# Patient Record
Sex: Male | Born: 1955 | Race: White | Hispanic: No | Marital: Married | Smoking: Former smoker
Health system: Southern US, Community
[De-identification: ages and names within clinical notes are randomized; demographics above are authoritative.]

## PROBLEM LIST (undated history)

## (undated) DIAGNOSIS — I639 Cerebral infarction, unspecified: Secondary | ICD-10-CM

## (undated) DIAGNOSIS — E7800 Pure hypercholesterolemia, unspecified: Secondary | ICD-10-CM

## (undated) DIAGNOSIS — E119 Type 2 diabetes mellitus without complications: Secondary | ICD-10-CM

## (undated) DIAGNOSIS — I10 Essential (primary) hypertension: Secondary | ICD-10-CM

## (undated) DIAGNOSIS — I2510 Atherosclerotic heart disease of native coronary artery without angina pectoris: Secondary | ICD-10-CM

## (undated) HISTORY — DX: Cerebral infarction, unspecified: I63.9

## (undated) HISTORY — DX: Pure hypercholesterolemia, unspecified: E78.00

## (undated) HISTORY — DX: Essential (primary) hypertension: I10

## (undated) HISTORY — DX: Type 2 diabetes mellitus without complications: E11.9

## (undated) HISTORY — PX: CORONARY ARTERY BYPASS GRAFT: SHX141

## (undated) HISTORY — DX: Atherosclerotic heart disease of native coronary artery without angina pectoris: I25.10

## (undated) NOTE — Progress Notes (Signed)
 Formatting of this note might be different from the original.  Please see study notes for details of this check    Completed by:  GLENDIA CHRISTELLA GENTRY, December 25, 2019, 8:47 AM      Electronically signed by GENTRY GLENDIA CHRISTELLA at 12/25/2019  8:51 AM EST

## (undated) NOTE — Telephone Encounter (Signed)
 Formatting of this note is different from the original.  Verbatim message to be read by call center staff, to member, when member is returning a call:    Member is over-due for the A1C lab test (TSR, please schedule a lab appointment, or inform member can walk-in to the lab),, is due for a diabetes chronic condition review (TSR, please schedule a PCP in-office appointment), and is over-due for the microalbumin urine lab test (TSR, please schedule a lab appointment, or inform member can walk-in to the lab),    A1C Outreach    Health Maintenance Due   Topic Date Due   ? Colorectal Cancer Screening  Never done   ? Diabetic Retinal Screening  06/24/2020   ? Covid-19 Vaccine (3 - Moderna-Booster (50 mcg)) 07/31/2020   ? Diabetic Foot Exam  03/17/2021     It?s time for your A1c lab test for diabetes. The A1c blood test checks your average blood sugar levels over 2-3 months and tells your doctor if your diabetes is under control. It?s important to keep your blood sugar in control to help prevent complications, such as heart and kidney disease, and blindness.     A lab order is already in our system. Please stop by the lab within the next week. You can go to any [facility] lab location and some are open 24/7, such as ______________ (name the closest facility open for AUC 24/7)     You do not need to fast for this test. Please go to the lab as soon as you can.     Outcome: Other LM on VM      ---------------------------------------------------------------------------------------------------------------------     Patient education resources if they want more information to manage their diabetes:   InSTEP classes    Register online on [email or web address] (schedule appointment for Diabetes InSTEP class or call [phone])    [email or web address]    Health coaching (nutrition, physical activity, tobacco cessation, stress management) by phone at a time determined by patient. No additional charge. Call [phone], Monday  through Friday from 10 a.m. to 10 p.m.   EMMI video programs    My doctor online web site ([email or web address]) and go to your PCPs page    Look for 'quick links' on the left side then drop down and click on EMMI    Electronically signed by Nguyen, Tsr Marilyn Angelique at 04/14/2021  2:13 PM EDT

## (undated) NOTE — Progress Notes (Signed)
 Formatting of this note is different from the original.  COMPLEX CARE FOLLOW-UP VISIT     Keith Gates is a 51 yr old male with history of hypertension, CKD, CAD, history of hyperkalemia here with wife for follow-up of blood pressure        Current complaints:  Patient was seen 2 weeks ago was noted to have chronically low blood pressure.  On carvedilol  6.25 mg twice a day prior to that.  Dose was decreased to 3.125 mg twice daily.   follow-up today was to see what readings are on this dose.  Patient actually did not decrease the dose continues on 6.25 mg twice daily.  He however notes that blood pressure readings at home are elevated around 140-150 systolic.  He did not bring his meter with him.  He is not sure if it is working right.  Denies being dizzy lightheaded or other complaints.  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                Member was unresponsive to outreach on 12/28/18.                 Dorthea Rubin HUGHS, CCM               [phone]                 Eula Mazzola is a 64 yr old male referred to case               management for Functional Deficitis               (Mobility/ADL), on 3/213/2020.                Member was contacted on 01/07/19 and agrees to               participate in Case management.                Dorthea Rubin HUGHS, CCM               [phone]                   Yorel Redder is a 76 yr old male referred to case               management for Assist and provide educational                resources on the following diagnosis, on               pericardial effusion.  Member was unresponsive to outreach on 04/04/18                Rock HERO. Stanton, RN, BSN, CCM               Ambulatory Case Manager               Phone: [phone]                 Branon Sabine is a 53 yr old male referred to case               management for Assist and provide educational               resources on the following diagnosis, on               pericardial effusion.                Member was contacted on 04/10/18 and declines to               participate in Case management.                Rock HERO. Stanton, RN, BSN, C    SOLITARY PULMONARY NODULE      Date Noted: 03/19/2018      Comment: Needs repeat CT chest in 12/19 or 6/20    HX OF CABG      Date Noted: 02/10/2018      Comment: 01/26/18:CORONARY ARTERY BYPASS GRAFT x 4 with               left leg endoscopic vein harvest., DRAINAGE               OFlargeLEFT AND RIGHT PLEURAL EFFUSIONs    MICROALBUMINURIA      Date Noted: 02/10/2018    SYSTOLIC HEART FAILURE, UNSPECIFIED ACUITY      Date Noted: 01/23/2018      Comment: Ef 30%       HX OF NON ST ELEVATION MI      Date Noted: 01/22/2018    SCREENING FOR DIABETIC FOOT DISEASE, CATEGORY 2 - FOOT AT MODERATE RISK      Date Noted: 10/13/2017    HX OF ISCHEMIC TIA      Date Noted: 09/14/2016      Comment: MRA in 2013: 1. No significant stenosis is               identified in the cervical or intracranial               vasculature.               2. 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    HTN (HYPERTENSION)      Date Noted: 09/14/2016      Comment:         ALLERGIES  No Known Allergies  Past Surgical History:   Procedure Laterality Date   ? CORONARY ARTERY BYPASS  02/2018   ? PERCUTANEOUS GASTROSTOMY TUBE  2019     Past Medical History:   Diagnosis Date   ? HX OF CABG 02/10/2018    01/26/18:CORONARY ARTERY BYPASS GRAFT x 4 with left leg endoscopic vein harvest., DRAINAGE OFlargeLEFT AND RIGHT PLEURAL EFFUSIONs      Social History     Tobacco Use   ? Smoking status: Former Smoker     Packs/day: 1.00     Types: Cigarettes     Start date: 09/15/1979  Quit date: 09/15/2011     Years since quitting: 8.9   ? Smokeless tobacco: Never Used   Vaping Use   ? Vaping Use: Never assessed   Substance Use Topics   ? Alcohol use: No   ? Drug use: No     Family History   Problem Relation Age of Onset   ? No Known Problems Father    ? No Known Problems Mother      Active Medications as of 08/25/2020:  ATORVASTATIN  80 MG ORAL TAB,  Sig: Take 1 tablet by mouth daily for cholesterol and heart protection  FUROSEMIDE  20 MG ORAL TAB,  Sig: Take 1 tablet by mouth daily  CARVEDILOL   3.125 MG ORAL TAB,  Sig: Take 1 tablet by mouth 2 times a day with meals  VELTASSA  8.4 G ORAL PWD PKT,  Sig: Take 1 packet by mouth daily  ASPIRIN    81 MG ORAL TBEC DR TAB,  Sig: Take 1 tablet by mouth daily  GLIPIZIDE  5 MG ORAL TAB,  Sig: Take 1 tablet by mouth every morning 30 minutes before a meal for diabetes  DOCUSATE SODIUM  100 MG ORAL CAP,  Sig: Take 1-2 capsules by mouth 2 times a day as needed  FERROUS SULFATE  325 MG (65 MG IRON ) ORAL TAB,  Sig: Take 1 tablet by mouth every morning with breakfast    Wt Readings from Last 3 Encounters:   08/25/20 166 lb 9.6 oz (75.6 kg)   08/11/20 166 lb (75.3 kg)   05/28/20 168 lb 4.8 oz (76.3 kg)     Temp Readings from Last 3 Encounters:   08/25/20 97.8 F (36.6 C) (Oral)   08/11/20 97.2 F (36.2 C) (Oral)   05/28/20 97.6 F (36.4 C) (Oral)     BP Readings from Last 3 Encounters:   08/25/20 (!) 85/45   08/11/20 92/58   05/28/20 100/58     Pulse Readings from Last 3 Encounters:   08/25/20 64   08/11/20 70   05/28/20 64     No results found for: WBC ONCO  Lab Results   Component Value Date    WBC 8.0 08/11/2020    HGB 14.1 08/11/2020    HCT 42.5 08/11/2020    PLT 155 08/11/2020     NA   Date Value Ref Range Status   08/11/2020 142 136 - 145 mmol/L Final     K   Date Value Ref Range Status   08/11/2020 5.4 (H) 3.6 - 5.2  mmol/L Final     CL   Date Value Ref Range Status   08/11/2020 108 (H) 98 - 107 mmol/L Final     CO2   Date Value Ref Range Status   08/11/2020 23 22 - 29 mmol/L Final     BUN   Date Value Ref Range Status   08/11/2020 32 (H) 8 - 23 mg/dL Final     RBS   Date Value Ref Range Status   08/11/2020 158 (H) 70 - 139 mg/dL Final     FBS   Date Value Ref Range Status   10/03/2017 111 (H) 70 - 100 mg/dL Final     CR   Date Value Ref Range Status   08/11/2020 2.09 (H) 0.70 - 1.20 mg/dL Final     Review of Systems - negative except as noted in HPI     Physical Examination  BP (!) 85/45   Pulse 64   Temp 97.8 F (36.6 C) (  Oral)   Resp 14   Wt 166 lb 9.6 oz (75.6 kg)   SpO2 97%   BMI 23.24 kg/m   Gen appearance - Awake, alert,  oriented , NAD  HEENT- PERLA, no JVD or lymphadenopathy. Neck supple. No thyromegaly   CVS-  S1S2 + regular  RS- BAE+ clear   Abd- soft, BS+, NT   Ext-  no edema  CNS- no FND  Psych- pleasant, co-operative , appropriate   Skin- no rash     Code status :  Full    No orders of the defined types were placed in this encounter.    Assessment    ICD-10-CM    1. DM 2 W HYPERLIPIDEMIA  E11.69     E78.5    2. CHRONIC HYPOTENSION  I95.89    3. ANEMIA  D64.9    4. COMPLEX CARE COORDINATION PLAN  Z71.89    5. HTN (HYPERTENSION)  I10    6. CKD STAGE 3B (GFR 30-44)  N18.32      Asked patient to decrease carvedilol  to 3.125 mg p.o. 2 times daily.  Follow-up appointment scheduled.  Advised to bring the blood pressure machine with him as well as glucometer.  Continue other medications as before.    I have confirmed the presence of the above clinical diagnoses, which were considered in the current and ongoing care of the patient. At the time of the visit, the patient stated, and/or the medical record indicates, that there are no changes in these conditions, unless otherwise noted, and the patient was advised to follow up with his PCP and Specialist  as his treatment warrants.    Complex care team will continue to  outreach patient regularly and is available for any questions/ concerns.  Pt was seen for 40 minutes face to face in clinic during this encounter and >50% of this time was spent in counseling and  co-ordination of care .      Electronically signed by Abdulsalam, Farah Zeshan (M.D.), M.D. at 08/25/2020  5:27 PM EST

## (undated) NOTE — Progress Notes (Signed)
 Formatting of this note is different from the original.  COMPLEX CARE FOLLOW-UP VISIT     Keith Gates is a 61 yr old male with history of hypertension, diabetes with hyperlipidemia, CKD stage IIIB, chronic hypertension, anemia, history of cardiac arrest, history of pacemaker, history of hyperkalemia here with wife for follow-up        Current complaints:    Patient brought his blood pressure machine.  Says that his blood pressure is elevated around 150 in the morning when he checks before medications.  This is usually around 11a.m.SABRA  Patient takes carvedilol  around 11:00 a.m. and again around 6:00 p.m.SABRA  Blood pressure in the office in the afternoon has consistently been low-less than 100 systolic usually.  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                Member was unresponsive to outreach on 12/28/18.                 Dorthea Rubin HUGHS, CCM               [phone]                 Castiel Lauricella is a 82 yr old male referred to case               management for Functional Deficitis               (Mobility/ADL), on 3/213/2020.                Member was contacted on 01/07/19 and agrees to               participate in Case management.                Dorthea Rubin HUGHS, CCM               [phone]                   Tamarion Haymond is a 69 yr old male referred to case               management for Assist and provide educational               resources on the  following diagnosis, on               pericardial effusion.                Member was unresponsive to outreach  on 04/04/18                Rock HERO. Stanton, RN, BSN, CCM               Ambulatory Case Manager               Phone: [phone]                 Jaaziah Schulke is a 83 yr old male referred to case               management for Assist and provide educational               resources on the following diagnosis, on               pericardial effusion.                Member was contacted on 04/10/18 and declines to               participate in Case management.                Rock HERO. Stanton, RN, BSN, C    SOLITARY PULMONARY NODULE      Date Noted: 03/19/2018      Comment: Needs repeat CT chest in 12/19 or 6/20    HX OF CABG      Date Noted: 02/10/2018      Comment: 01/26/18:CORONARY ARTERY BYPASS GRAFT x 4 with               left leg endoscopic vein harvest., DRAINAGE               OFlargeLEFT AND RIGHT PLEURAL EFFUSIONs    MICROALBUMINURIA      Date Noted: 02/10/2018    SYSTOLIC HEART FAILURE, UNSPECIFIED ACUITY      Date Noted: 01/23/2018      Comment: Ef 30%       HX OF NON ST ELEVATION MI      Date Noted: 01/22/2018    SCREENING FOR DIABETIC FOOT DISEASE, CATEGORY 2 - FOOT AT MODERATE RISK      Date Noted: 10/13/2017    HX OF ISCHEMIC TIA      Date Noted: 09/14/2016      Comment: MRA in 2013: 1. No significant stenosis is               identified in the cervical or intracranial               vasculature.               2. 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    HTN (HYPERTENSION)      Date Noted: 09/14/2016      Comment:         ALLERGIES  No Known Allergies  Past Surgical History:   Procedure Laterality Date   ? CORONARY ARTERY BYPASS  02/2018   ? PERCUTANEOUS GASTROSTOMY TUBE  2019     Past Medical History:   Diagnosis Date   ? HX OF CABG 02/10/2018    01/26/18:CORONARY ARTERY BYPASS GRAFT x 4 with left leg endoscopic vein harvest., DRAINAGE OFlargeLEFT AND RIGHT PLEURAL EFFUSIONs     Social  History     Tobacco Use   ? Smoking status: Former Smoker     Packs/day: 1.00     Types: Cigarettes     Start date: 09/15/1979     Quit date:  09/15/2011     Years since quitting: 8.9   ? Smokeless tobacco: Never Used   Vaping Use   ? Vaping Use: Never assessed   Substance Use Topics   ? Alcohol use: No   ? Drug use: No     Family History   Problem Relation Age of Onset   ? No Known Problems Father    ? No Known Problems Mother      Active Medications as of 09/01/2020:  ATORVASTATIN  80 MG ORAL TAB,  Sig: Take 1 tablet by mouth daily for cholesterol and heart protection  FUROSEMIDE  20 MG ORAL TAB,  Sig: Take 1 tablet by mouth daily  CARVEDILOL   3.125 MG ORAL TAB,  Sig: Take 1 tablet by mouth 2 times a day with meals  VELTASSA  8.4 G ORAL PWD PKT,  Sig: Take 1 packet by mouth daily  ASPIRIN    81 MG ORAL TBEC DR TAB,  Sig: Take 1 tablet by mouth daily  GLIPIZIDE  5 MG ORAL TAB,  Sig: Take 1 tablet by mouth every morning 30 minutes before a meal for diabetes    Wt Readings from Last 3 Encounters:   09/01/20 165 lb (74.8 kg)   08/25/20 166 lb 9.6 oz (75.6 kg)   08/11/20 166 lb (75.3 kg)     Temp Readings from Last 3 Encounters:   09/01/20 97 F (36.1 C) (Oral)   08/25/20 97.8 F (36.6 C) (Oral)   08/11/20 97.2 F (36.2 C) (Oral)     BP Readings from Last 3 Encounters:   09/01/20 98/58   08/25/20 (!) 85/45   08/11/20 92/58     Pulse Readings from Last 3 Encounters:   09/01/20 70   08/25/20 64   08/11/20 70     No results found for: WBC ONCO  Lab Results   Component Value Date    WBC 8.0 08/11/2020    HGB 14.1 08/11/2020    HCT 42.5 08/11/2020    PLT 155 08/11/2020     NA   Date Value Ref Range Status   09/01/2020 143 136 - 145 mmol/L Final     K   Date Value Ref Range Status   09/01/2020 4.3 3.6 - 5.2 mmol/L Final     CL   Date Value Ref Range Status   09/01/2020 109 (H) 98 - 107 mmol/L Final     CO2   Date Value Ref Range Status   09/01/2020 27 22 - 29 mmol/L Final     BUN   Date Value Ref Range Status   09/01/2020 57  (H) 8 - 23 mg/dL Final     RBS   Date Value Ref Range Status   09/01/2020 155 (H) 70 - 139 mg/dL Final     FBS   Date Value Ref Range Status   10/03/2017 111 (H) 70 - 100 mg/dL Final     CR   Date Value Ref Range Status   09/01/2020 2.20 (H) 0.70 - 1.20 mg/dL Final     Review of Systems - negative except as noted in HPI     Physical Examination  BP 98/58   Pulse 70   Temp 97 F (36.1 C) (Oral)   Resp 18   Wt 165 lb (74.8 kg)   SpO2 97%   BMI 23.01 kg/m   Gen appearance - Awake, alert,  oriented , NAD  HEENT- PERLA, no JVD or lymphadenopathy. Neck supple. No thyromegaly   CVS-  S1S2 + regular  RS- BAE+ clear   Abd- soft, BS+, NT   Ext-  no edema  CNS- no FND  Psych- pleasant, co-operative , appropriate   Skin- no rash     Code status :  Full    Orders Placed This Encounter   ? Chem 7     Assessment    ICD-10-CM    1. COMPLEX CARE COORDINATION PLAN  Z71.89    2. HYPERKALEMIA  E87.5 CHEM 7, NON-FASTING (NA, K, CL, CO2, BUN, GLUC, CR)   3. CHRONIC HYPOTENSION  I95.89    4. DM 2 W HYPERLIPIDEMIA  E11.69     E78.5    5. ANEMIA  D64.9    6. HTN (HYPERTENSION)  I10    7. CKD STAGE 3B (GFR 30-44)  N18.32    8. HX OF CABG  Z95.1    9. CAD (CORONARY ARTERY DISEASE)  I25.10    10. HX OF CARDIAC ARREST  Z86.74    11. PRESENCE OF PERMANENT CARDIAC PACEMAKER  Z95.0    12. DM 2 W UNSPECIFIED NEUROPATHY  E11.40      Advised to continue carvedilol  3.125 mg p.o. twice daily.  Advised to space out medications 10-12 hours apart.  Advised to get Optometry appointment for eye exam  Will need to have a a colonoscopy.  Since he is moving next month he will get this done in [place]   He needs to get COVID booster done.  Patient plans to get it in the next couple of days  Repeat potassium, due for urine microalbumin.  Patient will get both done  Advised compliance with medications.  Reviewed medications with patient and wife  Orthostatics checked.  No orthostatic hypotension noted.  Patient's blood pressure machine is working  fine when compared with the office machine  Follow-up by phone scheduled  Patient will call me with any concerns in the interim    I have confirmed the presence of the above clinical diagnoses, which were considered in the current and ongoing care of the patient. At the time of the visit, the patient stated, and/or the medical record indicates, that there are no changes in these conditions, unless otherwise noted, and the patient was advised to follow up with his PCP and Specialist  as his treatment warrants.    Complex care team will continue to outreach patient regularly and is available for any questions/ concerns.  Pt was seen for 40 minutes face to face in clinic during this encounter and >50% of this time was spent in counseling and  co-ordination of care .      Electronically signed by Abdulsalam, Farah Zeshan (M.D.), M.D. at 09/01/2020  6:15 PM EST

## (undated) NOTE — Telephone Encounter (Signed)
 Formatting of this note is different from the original.  Verbatim message to be read by call center staff, to member, when member is returning a call:    Member is over-due for the A1C lab test (TSR, please schedule a lab appointment, or inform member can walk-in to the lab),, needs to follow-up on diabetic A1C (TSR, please schedule a PCP, VV, or TAV),, is due for a diabetic retinal screening (TSR, please schedule a CA appointment),, and is over-due for the microalbumin urine lab test (TSR, please schedule a lab appointment, or inform member can walk-in to the lab),    A1C Outreach    Health Maintenance Due   Topic Date Due    Colorectal Cancer Screening  Never done    Covid-19 Vaccine (3 - Moderna-Booster BV (50 mcg)) 04/30/2020    Diabetic Retinal Screening  06/24/2020    Diabetic Foot Exam  03/17/2021    Screening for Depression (PHQ-2)  05/28/2021    Flu Vaccine (1) 06/17/2021    A1c Blood Sugar Test  08/11/2021     It?s time for your A1c lab test for diabetes. The A1c blood test checks your average blood sugar levels over 2-3 months and tells your doctor if your diabetes is under control. It?s important to keep your blood sugar in control to help prevent complications, such as heart and kidney disease, and blindness.     A lab order is already in our system. Please stop by the lab within the next week. You can go to any [facility] lab location and some are open 24/7, such as ______________ (name the closest facility open for AUC 24/7)     You do not need to fast for this test. Please go to the lab as soon as you can.     Outcome: Other all numbers kept ringing, no VM      ---------------------------------------------------------------------------------------------------------------------     Patient education resources if they want more information to manage their diabetes:  InSTEP classes   Register online on [email or web address] (schedule appointment for Diabetes InSTEP class or call [phone])    [email or web address]   Health coaching (nutrition, physical activity, tobacco cessation, stress management) by phone at a time determined by patient. No additional charge. Call [phone], Monday through Friday from 10 a.m. to 10 p.m.  EMMI video programs   My doctor online web site ([email or web address]) and go to your PCPs page   Look for 'quick links' on the left side then drop down and click on EMMI    Electronically signed by Frutoso Reiter, Arvind at 08/03/2021  5:28 PM EDT

---

## 2011-06-21 ENCOUNTER — Ambulatory Visit
Admission: RE | Admit: 2011-06-21 | Disposition: A | Discharge status: Home / Self Care | Payer: Self-pay | Source: Ambulatory Visit | Attending: Specialist | Admitting: Specialist

## 2011-06-23 ENCOUNTER — Ambulatory Visit
Admission: RE | Admit: 2011-06-23 | Disposition: A | Discharge status: Home / Self Care | Payer: Self-pay | Source: Ambulatory Visit | Attending: Specialist | Admitting: Specialist

## 2011-06-24 ENCOUNTER — Ambulatory Visit
Admission: RE | Admit: 2011-06-24 | Disposition: A | Discharge status: Home / Self Care | Payer: Self-pay | Source: Ambulatory Visit | Attending: Specialist | Admitting: Specialist

## 2018-01-15 HISTORY — PX: CORONARY ARTERY BYPASS GRAFT: SHX141

## 2018-01-22 ENCOUNTER — Emergency Department: Payer: PRIVATE HEALTH INSURANCE

## 2018-01-22 ENCOUNTER — Inpatient Hospital Stay
Admission: EM | Admit: 2018-01-22 | Discharge: 2018-01-23 | DRG: 280 | Disposition: A | Discharge status: Other Acute Inpatient Hospital | Payer: PRIVATE HEALTH INSURANCE | Attending: Critical Care Medicine | Admitting: Critical Care Medicine

## 2018-01-22 DIAGNOSIS — N179 Acute kidney failure, unspecified: Secondary | ICD-10-CM | POA: Diagnosis present

## 2018-01-22 DIAGNOSIS — I5023 Acute on chronic systolic (congestive) heart failure: Secondary | ICD-10-CM | POA: Diagnosis present

## 2018-01-22 DIAGNOSIS — I5041 Acute combined systolic (congestive) and diastolic (congestive) heart failure: Secondary | ICD-10-CM

## 2018-01-22 DIAGNOSIS — I110 Hypertensive heart disease with heart failure: Secondary | ICD-10-CM | POA: Diagnosis present

## 2018-01-22 DIAGNOSIS — E1310 Other specified diabetes mellitus with ketoacidosis without coma: Secondary | ICD-10-CM

## 2018-01-22 DIAGNOSIS — Z87891 Personal history of nicotine dependence: Secondary | ICD-10-CM

## 2018-01-22 DIAGNOSIS — R0602 Shortness of breath: Secondary | ICD-10-CM

## 2018-01-22 DIAGNOSIS — R0902 Hypoxemia: Secondary | ICD-10-CM | POA: Diagnosis present

## 2018-01-22 DIAGNOSIS — E1110 Type 2 diabetes mellitus with ketoacidosis without coma: Secondary | ICD-10-CM | POA: Diagnosis present

## 2018-01-22 DIAGNOSIS — I4510 Unspecified right bundle-branch block: Secondary | ICD-10-CM | POA: Diagnosis present

## 2018-01-22 DIAGNOSIS — E872 Acidosis, unspecified: Secondary | ICD-10-CM

## 2018-01-22 DIAGNOSIS — Z8673 Personal history of transient ischemic attack (TIA), and cerebral infarction without residual deficits: Secondary | ICD-10-CM

## 2018-01-22 DIAGNOSIS — I272 Pulmonary hypertension, unspecified: Secondary | ICD-10-CM | POA: Diagnosis present

## 2018-01-22 DIAGNOSIS — E7800 Pure hypercholesterolemia, unspecified: Secondary | ICD-10-CM | POA: Diagnosis present

## 2018-01-22 DIAGNOSIS — I214 Non-ST elevation (NSTEMI) myocardial infarction: Principal | ICD-10-CM | POA: Diagnosis present

## 2018-01-22 DIAGNOSIS — Z79899 Other long term (current) drug therapy: Secondary | ICD-10-CM

## 2018-01-22 LAB — ECG 12-LEAD
Atrial Rate: 114 {beats}/min
P-R Interval: 184 ms
Q-T Interval: 358 ms
QRS Duration: 122 ms
QTC Calculation (Bezet): 493 ms
R Axis: -81 degrees
T Axis: 100 degrees
Ventricular Rate: 114 {beats}/min

## 2018-01-22 LAB — BASIC METABOLIC PANEL
Anion Gap: 12 (ref 5.0–15.0)
BUN: 42.3 mg/dL — ABNORMAL HIGH (ref 9.0–28.0)
CO2: 23 mEq/L (ref 22–29)
Calcium: 8.6 mg/dL (ref 8.5–10.5)
Chloride: 103 mEq/L (ref 100–111)
Creatinine: 1.9 mg/dL — ABNORMAL HIGH (ref 0.7–1.3)
Glucose: 292 mg/dL — ABNORMAL HIGH (ref 70–100)
Potassium: 4.7 mEq/L (ref 3.5–5.1)
Sodium: 138 mEq/L (ref 136–145)

## 2018-01-22 LAB — TROPONIN I
Troponin I: 9.25 ng/mL (ref 0.00–0.09)
Troponin I: 9.53 ng/mL (ref 0.00–0.09)
Troponin I: 11.46 ng/mL (ref 0.00–0.09)
Troponin I: 12.2 ng/mL (ref 0.00–0.09)

## 2018-01-22 LAB — CBC AND DIFFERENTIAL
Absolute NRBC: 0 10*3/uL (ref 0.00–0.00)
Basophils Absolute Automated: 0.02 10*3/uL (ref 0.00–0.08)
Basophils Automated: 0.1 %
Eosinophils Absolute Automated: 0 10*3/uL (ref 0.00–0.44)
Eosinophils Automated: 0 %
Hematocrit: 42.9 % (ref 37.6–49.6)
Hgb: 14.3 g/dL (ref 12.5–17.1)
Immature Granulocytes Absolute: 0.14 10*3/uL — ABNORMAL HIGH (ref 0.00–0.07)
Immature Granulocytes: 0.8 %
Lymphocytes Absolute Automated: 0.98 10*3/uL (ref 0.42–3.22)
Lymphocytes Automated: 5.5 %
MCH: 29.9 pg (ref 25.1–33.5)
MCHC: 33.3 g/dL (ref 31.5–35.8)
MCV: 89.6 fL (ref 78.0–96.0)
MPV: 13.5 fL — ABNORMAL HIGH (ref 8.9–12.5)
Monocytes Absolute Automated: 0.84 10*3/uL (ref 0.21–0.85)
Monocytes: 4.7 %
Neutrophils Absolute: 16 10*3/uL — ABNORMAL HIGH (ref 1.10–6.33)
Neutrophils: 88.9 %
Nucleated RBC: 0 /100 WBC (ref 0.0–0.0)
Platelets: 210 10*3/uL (ref 142–346)
RBC: 4.79 10*6/uL (ref 4.20–5.90)
RDW: 14 % (ref 11–15)
WBC: 17.98 10*3/uL — ABNORMAL HIGH (ref 3.10–9.50)

## 2018-01-22 LAB — GLUCOSE WHOLE BLOOD - POCT
Whole Blood Glucose POCT: 393 mg/dL — ABNORMAL HIGH (ref 70–100)
Whole Blood Glucose POCT: 323 mg/dL — ABNORMAL HIGH (ref 70–100)
Whole Blood Glucose POCT: 215 mg/dL — ABNORMAL HIGH (ref 70–100)
Whole Blood Glucose POCT: 168 mg/dL — ABNORMAL HIGH (ref 70–100)
Whole Blood Glucose POCT: 173 mg/dL — ABNORMAL HIGH (ref 70–100)
Whole Blood Glucose POCT: 220 mg/dL — ABNORMAL HIGH (ref 70–100)
Whole Blood Glucose POCT: 230 mg/dL — ABNORMAL HIGH (ref 70–100)
Whole Blood Glucose POCT: 195 mg/dL — ABNORMAL HIGH (ref 70–100)
Whole Blood Glucose POCT: 231 mg/dL — ABNORMAL HIGH (ref 70–100)

## 2018-01-22 LAB — LACTIC ACID, PLASMA
Lactic Acid: 7.1 mmol/L (ref 0.2–2.0)
Lactic Acid: 4 mmol/L (ref 0.2–2.0)
Lactic Acid: 2.6 mmol/L — ABNORMAL HIGH (ref 0.2–2.0)

## 2018-01-22 LAB — MAGNESIUM
Magnesium: 1.1 mg/dL — ABNORMAL LOW (ref 1.6–2.6)
Magnesium: 1.6 mg/dL (ref 1.6–2.6)

## 2018-01-22 LAB — COMPREHENSIVE METABOLIC PANEL
ALT: 22 U/L (ref 0–55)
ALT: 19 U/L (ref 0–55)
AST (SGOT): 39 U/L — ABNORMAL HIGH (ref 5–34)
AST (SGOT): 38 U/L — ABNORMAL HIGH (ref 5–34)
Albumin/Globulin Ratio: 1.1 (ref 0.9–2.2)
Albumin/Globulin Ratio: 1.2 (ref 0.9–2.2)
Albumin: 3.7 g/dL (ref 3.5–5.0)
Albumin: 3.2 g/dL — ABNORMAL LOW (ref 3.5–5.0)
Alkaline Phosphatase: 147 U/L — ABNORMAL HIGH (ref 38–106)
Alkaline Phosphatase: 123 U/L — ABNORMAL HIGH (ref 38–106)
Anion Gap: 16 — ABNORMAL HIGH (ref 5.0–15.0)
Anion Gap: 11 (ref 5.0–15.0)
BUN: 47.6 mg/dL — ABNORMAL HIGH (ref 9.0–28.0)
BUN: 41.9 mg/dL — ABNORMAL HIGH (ref 9.0–28.0)
Bilirubin, Total: 0.5 mg/dL (ref 0.2–1.2)
Bilirubin, Total: 0.5 mg/dL (ref 0.2–1.2)
CO2: 20 mEq/L — ABNORMAL LOW (ref 22–29)
CO2: 21 mEq/L — ABNORMAL LOW (ref 22–29)
Calcium: 9.6 mg/dL (ref 8.5–10.5)
Calcium: 8.5 mg/dL (ref 8.5–10.5)
Chloride: 101 mEq/L (ref 100–111)
Chloride: 107 mEq/L (ref 100–111)
Creatinine: 2.3 mg/dL — ABNORMAL HIGH (ref 0.7–1.3)
Creatinine: 1.8 mg/dL — ABNORMAL HIGH (ref 0.7–1.3)
Globulin: 3.3 g/dL (ref 2.0–3.6)
Globulin: 2.6 g/dL (ref 2.0–3.6)
Glucose: 444 mg/dL — ABNORMAL HIGH (ref 70–100)
Glucose: 211 mg/dL — ABNORMAL HIGH (ref 70–100)
Potassium: 5 mEq/L (ref 3.5–5.1)
Potassium: 4.5 mEq/L (ref 3.5–5.1)
Protein, Total: 7 g/dL (ref 6.0–8.3)
Protein, Total: 5.8 g/dL — ABNORMAL LOW (ref 6.0–8.3)
Sodium: 137 mEq/L (ref 136–145)
Sodium: 139 mEq/L (ref 136–145)

## 2018-01-22 LAB — PT AND APTT
PT INR: 1.2 — ABNORMAL HIGH (ref 0.9–1.1)
PT: 15 s (ref 12.6–15.0)
PTT: 27 s (ref 23–37)

## 2018-01-22 LAB — APTT: PTT: 53 s — ABNORMAL HIGH (ref 23–37)

## 2018-01-22 LAB — GFR
EGFR: 29.2
EGFR: 36.3
EGFR: 38.7

## 2018-01-22 LAB — PHOSPHORUS: Phosphorus: 4.1 mg/dL (ref 2.3–4.7)

## 2018-01-22 LAB — B-TYPE NATRIURETIC PEPTIDE: B-Natriuretic Peptide: 2590.1 pg/mL — ABNORMAL HIGH (ref 0.0–100.0)

## 2018-01-22 MED ORDER — ATORVASTATIN CALCIUM 20 MG PO TABS
40.00 mg | ORAL_TABLET | Freq: Every evening | ORAL | Status: DC
Start: 2018-01-22 — End: 2018-01-23
  Administered 2018-01-22: 23:00:00 40 mg via ORAL
  Filled 2018-01-22: qty 2

## 2018-01-22 MED ORDER — DEXTROSE-NACL 5-0.9 % IV SOLN
INTRAVENOUS | Status: DC
Start: 2018-01-22 — End: 2018-01-23

## 2018-01-22 MED ORDER — ASPIRIN 325 MG PO TBEC
325.00 mg | DELAYED_RELEASE_TABLET | Freq: Every day | ORAL | Status: DC
Start: 2018-01-23 — End: 2018-01-23
  Administered 2018-01-23: 09:00:00 325 mg via ORAL
  Filled 2018-01-22: qty 1

## 2018-01-22 MED ORDER — SODIUM CHLORIDE 0.9 % IV BOLUS
1000.00 mL | Freq: Once | INTRAVENOUS | Status: AC
Start: 2018-01-22 — End: 2018-01-22
  Administered 2018-01-22: 13:00:00 1000 mL via INTRAVENOUS

## 2018-01-22 MED ORDER — AZITHROMYCIN 500 MG IV SOLR
500.00 mg | Freq: Once | INTRAVENOUS | Status: DC
Start: 2018-01-22 — End: 2018-01-22

## 2018-01-22 MED ORDER — FAMOTIDINE 20 MG PO TABS
20.00 mg | ORAL_TABLET | Freq: Every day | ORAL | Status: DC
Start: 2018-01-22 — End: 2018-01-23
  Administered 2018-01-22: 23:00:00 20 mg via ORAL
  Filled 2018-01-22: qty 1

## 2018-01-22 MED ORDER — SODIUM CHLORIDE 0.9 % IV MBP
2.25 g | Freq: Once | INTRAVENOUS | Status: AC
Start: 2018-01-22 — End: 2018-01-22
  Administered 2018-01-22: 12:00:00 2.25 g via INTRAVENOUS
  Filled 2018-01-22: qty 2.25

## 2018-01-22 MED ORDER — ACETAMINOPHEN 325 MG PO TABS
650.0000 mg | ORAL_TABLET | ORAL | Status: DC | PRN
Start: 2018-01-22 — End: 2018-01-23

## 2018-01-22 MED ORDER — SODIUM CHLORIDE 0.9 % IV SOLN
4.00 [IU]/h | INTRAVENOUS | Status: DC
Start: 2018-01-22 — End: 2018-01-22
  Administered 2018-01-22: 17:00:00 2 [IU]/h via INTRAVENOUS

## 2018-01-22 MED ORDER — MAGNESIUM SULFATE IN D5W 1-5 GM/100ML-% IV SOLN
1.00 g | Freq: Once | INTRAVENOUS | Status: AC
Start: 2018-01-22 — End: 2018-01-22
  Administered 2018-01-22: 12:00:00 1 g via INTRAVENOUS
  Filled 2018-01-22: qty 100

## 2018-01-22 MED ORDER — SODIUM CHLORIDE 0.9 % IV BOLUS
1000.00 mL | Freq: Once | INTRAVENOUS | Status: DC
Start: 2018-01-22 — End: 2018-01-22

## 2018-01-22 MED ORDER — INSULIN LISPRO 100 UNIT/ML SC SOLN
1.00 [IU] | Freq: Every evening | SUBCUTANEOUS | Status: DC
Start: 2018-01-22 — End: 2018-01-23
  Administered 2018-01-22: 23:00:00 1 [IU] via SUBCUTANEOUS
  Filled 2018-01-22: qty 9
  Filled 2018-01-22: qty 3

## 2018-01-22 MED ORDER — MAGNESIUM SULFATE IN D5W 1-5 GM/100ML-% IV SOLN
1.00 g | Freq: Once | INTRAVENOUS | Status: AC
Start: 2018-01-22 — End: 2018-01-22
  Administered 2018-01-22: 11:00:00 1 g via INTRAVENOUS
  Filled 2018-01-22: qty 100

## 2018-01-22 MED ORDER — GLUCOSE 40 % PO GEL
15.00 g | ORAL | Status: DC | PRN
Start: 2018-01-22 — End: 2018-01-23

## 2018-01-22 MED ORDER — SODIUM CHLORIDE 0.9 % IV SOLN
0.1000 [IU]/kg/h | INTRAVENOUS | Status: DC
Start: 2018-01-22 — End: 2018-01-22
  Administered 2018-01-22: 0.1 [IU]/kg/h via INTRAVENOUS
  Filled 2018-01-22: qty 1

## 2018-01-22 MED ORDER — INSULIN GLARGINE 100 UNIT/ML SC SOLN
10.00 [IU] | Freq: Every evening | SUBCUTANEOUS | Status: DC
Start: 2018-01-22 — End: 2018-01-23
  Administered 2018-01-22: 21:00:00 10 [IU] via SUBCUTANEOUS
  Filled 2018-01-22: qty 10

## 2018-01-22 MED ORDER — DEXTROSE 50 % IV SOLN
12.50 g | INTRAVENOUS | Status: DC | PRN
Start: 2018-01-22 — End: 2018-01-23

## 2018-01-22 MED ORDER — GLUCAGON 1 MG IJ SOLR (WRAP)
1.00 mg | INTRAMUSCULAR | Status: DC | PRN
Start: 2018-01-22 — End: 2018-01-23

## 2018-01-22 MED ORDER — HEPARIN (PORCINE) IN D5W 50-5 UNIT/ML-% IV SOLN (UNITS/KG/HR ONLY)
12.00 [IU]/kg/h | INTRAVENOUS | Status: DC
Start: 2018-01-22 — End: 2018-01-23
  Administered 2018-01-22: 12:00:00 12 [IU]/kg/h via INTRAVENOUS
  Administered 2018-01-23: 12:00:00 13 [IU]/kg/h via INTRAVENOUS
  Filled 2018-01-22 (×2): qty 500

## 2018-01-22 MED ORDER — INSULIN LISPRO 100 UNIT/ML SC SOLN
1.00 [IU] | Freq: Three times a day (TID) | SUBCUTANEOUS | Status: DC
Start: 2018-01-23 — End: 2018-01-23
  Administered 2018-01-23: 08:00:00 3 [IU] via SUBCUTANEOUS

## 2018-01-22 MED ORDER — SODIUM CHLORIDE 0.9 % IV MBP
1.00 g | Freq: Once | INTRAVENOUS | Status: DC
Start: 2018-01-22 — End: 2018-01-22

## 2018-01-22 MED ORDER — HEPARIN SODIUM (PORCINE) 5000 UNIT/ML IJ SOLN
4000.00 [IU] | Freq: Once | INTRAMUSCULAR | Status: AC
Start: 2018-01-22 — End: 2018-01-22
  Administered 2018-01-22: 12:00:00 4000 [IU] via INTRAVENOUS
  Filled 2018-01-22: qty 1

## 2018-01-22 MED ORDER — ASPIRIN 81 MG PO CHEW
324.0000 mg | CHEWABLE_TABLET | Freq: Once | ORAL | Status: AC
Start: 2018-01-22 — End: 2018-01-22
  Administered 2018-01-22: 324 mg via ORAL
  Filled 2018-01-22: qty 4

## 2018-01-22 MED ORDER — SODIUM CHLORIDE 0.9 % IV BOLUS
1000.0000 mL | Freq: Once | INTRAVENOUS | Status: DC
Start: 2018-01-22 — End: 2018-01-22
  Administered 2018-01-22: 1000 mL via INTRAVENOUS

## 2018-01-22 MED ORDER — INSULIN REGULAR HUMAN 100 UNIT/ML IJ SOLN
8.0000 [IU] | Freq: Once | INTRAMUSCULAR | Status: AC
Start: 2018-01-22 — End: 2018-01-22
  Administered 2018-01-22: 8 [IU] via INTRAVENOUS
  Filled 2018-01-22: qty 24

## 2018-01-22 NOTE — Progress Notes (Signed)
Received patient from [facility] via stretcher. Alert and ambulatory to the bed. Denies c/p and SOB during transfer. Low intensity heparin gtt and maintenance fluids infusing atr time of arrival. Insulin gtt off. Blood sugar checked on arrival (bs=220), insulin gtt restarted per sliding scale.     Placed patient on the monitor, VSS at time of arrival. Dr. Lesle Reek present to evaluate patient. Plan of care reviewed with patient, verbalized understanding. Will continue to monitor closely. Call bell within reach.

## 2018-01-22 NOTE — ED Notes (Signed)
POC BG 215 on insulin gtt.  Decreased infusion to 4 units/hr per algorithm #2 and page MD Mendiguren for orders to add dextrose to IVF.  Awaiting response.

## 2018-01-22 NOTE — H&P (Signed)
ADMISSION HISTORY AND PHYSICAL EXAM    Date Time: 01/22/18 6:47 PM  Patient Name: Keith Gates  Attending Physician: Kirstie Mirza I, MD    Assessment:   62 year old male presented with non-ST elevation myocardial infarction, uncontrolled diabetes, acute kidney injury, possible pneumonia versus congestive heart failure.  The acute renal failure precludes cardiac catheterization at this time, plus the patient is pain-free.  She also has uncontrolled hyperglycemia, possible early ketoacidosis  Lactic acidosis.  He also may have a community acquired pneumonia or aspiration pneumonia.    Plan:   Admit to intensive care unit for treatment of non-STEMI, possible pneumonia, acute kidney injury, uncontrolled diabetes.  We will give IV heparin, plus aspirin, empiric  Antibiotics, IV insulin, infectious diseases workup.  Check echocardiogram.  Cardiology following.  [place] [facility].    History of Present Illness:   Keith Gates is a 62 y.o. male with history of hypertension, diabetes and hypercholesterolemia who presented to the hospital with malaise and epigastric distress for about 3 days.  He noticed shortness of breath and had nausea, vomiting 2 days ago.  There was mild chest discomfort on exertion but no definitive chest pain.there was no diarrhea.  There was no fever.  There was no sputum production.  He had been compliant with his medication  He had tachycardia of 113, but otherwise stable vital signs and he was afebrile.laboratory data showed severe hyperglycemia.  444 with kidney injury with creatinine of 2.3troponin was positive.  Troponin was positive at 9.5 with an elevated BNPof greater than 2000.  The patient also had lactic acidosis.EKG shows sinus tachycardia, interventricular conduction delay, Q inferiorly and ST depression laterally.  The patient is admitted to intensive care unit for treatment of non-ST elevation myocardial infarction, uncontrolled diabetes and acute kidney injury.  At our exam  he denies shortness of breath or chest pain.    Past Medical History:     Past Medical History:   Diagnosis Date   . Diabetes mellitus    . Elevated cholesterol    . Hypertension    . Stroke        Past Surgical History:   History reviewed. No pertinent surgical history.    Family History:   History reviewed. No pertinent family history.    Social History:     Social History     Social History   . Marital status: Married     Spouse name: N/A   . Number of children: N/A   . Years of education: N/A     Social History Main Topics   . Smoking status: Former Smoker     Years: 30.00     Quit date: 10/17/2002   . Smokeless tobacco: Never Used   . Alcohol use Yes   . Drug use: No   . Sexual activity: Not on file     Other Topics Concern   . Not on file     Social History Narrative   . No narrative on file   the patient works for [employer].  He was working in [place] until about 2 weeks ago when he traveled back to [employer] [place].    Allergies:   No Known Allergies    Medications:     Prescriptions Prior to Admission   Medication Sig   . atorvastatin (LIPITOR) 80 MG tablet Take 80 mg by mouth daily       . glipiZIDE (GLUCOTROL) 10 MG tablet Take 10 mg by mouth 2 (two) times  daily before meals       . lisinopril (PRINIVIL,ZESTRIL) 5 MG tablet Take 5 mg by mouth daily       . metFORMIN (GLUCOPHAGE) 1000 MG tablet Take 1,000 mg by mouth 2 (two) times daily with meals         Scheduled Meds:  Current Facility-Administered Medications   Medication Dose Route Frequency   . [START ON 01/23/2018] aspirin EC  325 mg Oral Daily   . atorvastatin  40 mg Oral QHS   . famotidine  20 mg Oral Daily   . insulin glargine  10 Units Subcutaneous QHS   . insulin lispro  1-3 Units Subcutaneous QHS   . [START ON 01/23/2018] insulin lispro  1-5 Units Subcutaneous TID AC     Continuous Infusions:  . dextrose 5 % and 0.9% NaCl 75 mL/hr at 01/22/18 1414   . heparin infusion 25,000 units/500 mL (Cardiac/Low Intensity) 12 Units/kg/hr (01/22/18 1138)     PRN  Meds:.acetaminophen, Nursing communication: Adult Hypoglycemia Treatment Algorithm **AND** dextrose **AND** dextrose **AND** glucagon (rDNA)    Review of Systems:   A comprehensive review of systems was: ENT ROS: negative for - epistaxis, headaches, nasal congestion, nasal polyps, oral lesions, sore throat, vertigo, visual changes or vocal changes.  Cough 3 days ago  Hematological and Lymphatic ROS: negative for - bleeding problems, blood clots, jaundice, swollen lymph nodes or weight loss  Respiratory ROS: negative for - positive dry cough, hemoptysis, orthopnea,positive shortness of breath, no stridor or wheezing. see history of present illness.  No history of asthma  Cardiovascular BJY:NWGNF discomfort per history of present illness. irregular heartbeat, murmur or palpitations  Gastrointestinal ROS: negative for - abdominal pain, appetite loss, blood in stools, change in bowel habits, diarrhea, nausea/vomiting or swallowing difficulty/pain.  Had nausea and vomiting of clear material several days ago  Genito-Urinary ROS: negative for - dysuria, hematuria or urinary frequency/urgency  Musculoskeletal ROS: negative for - joint pain, joint swelling or muscular weakness.  Positive malaise  Neurological ROS: negative for - confusion, gait disturbance, seizures or weakness  Dermatological ROS: negative for rash and skin lesion changes    Physical Exam:     Vitals:    01/22/18 1630   BP:    Pulse:    Resp:    Temp: 98.4 F (36.9 C)   SpO2:        Intake and Output Summary (Last 24 hours) at Date Time    Intake/Output Summary (Last 24 hours) at 01/22/18 1847  Last data filed at 01/22/18 1432   Gross per 24 hour   Intake          2312.23 ml   Output                0 ml   Net          2312.23 ml       General appearance - alert, well appearing, and in no distress and oriented to person, place, and time.  Pale skin  Mental status - alert, oriented to person, place, and time  Eyes - pupils equal and reactive, extraocular eye  movements intact, sclera anicteric  Ears - bilateral TM's and external ear canals normal  Nose - normal and patent, no erythema, discharge or polyps  Mouth - mucous membranes moist, pharynx normal without lesions  Neck - supple, no significant adenopathy, no  Jugular venous distension  Lymphatics - no palpable lymphadenopathy, no hepatosplenomegaly  Chest - bibasilar crackles.  No  wheezes  Heart - normal rate, regular rhythm, normal S1, S2, no murmurs, rubs, clicks or gallops  Abdomen - soft, nontender, nondistended, no masses or organomegaly  Rectal - deferred, not clinically indicated  Back exam - full range of motion, no tenderness, palpable spasm or pain on motion  Neurological - alert, oriented, normal speech, no focal findings or movement disorder noted  Musculoskeletal - no joint tenderness, deformity or swelling  Extremities - peripheral pulses normal, no pedal edema, no clubbing or cyanosis  Skin - normal coloration and turgor, no rashes, no suspicious skin lesions noted    Labs:   Recent CBC   Recent Labs  Lab 01/22/18  1010   WBC 17.98*   RBC 4.79   Hgb 14.3   Hematocrit 42.9   MCV 89.6   Platelets 210         Recent Labs  Lab 01/22/18  1610 01/22/18  1339 01/22/18  1010   Sodium 138  --  137   Potassium 4.7  --  5.0   Chloride 103  --  101   CO2 23  --  20*   Glucose 292*  --  444*   BUN 42.3*  --  47.6*   Creatinine 1.9*  --  2.3*   Magnesium 1.6  --  1.1*   Phosphorus  --   --  4.1   AST (SGOT)  --   --  39*   ALT  --   --  22   Alkaline Phosphatase  --   --  147*   B-Natriuretic Peptide  --   --  2,590.1*   PT INR  --   --  1.2*   PT  --   --  15.0   PTT  --   --  27   Troponin I 11.46* 9.53* 9.25*   lactic acid 4.0    Rads:     Radiology Results (24 Hour)     Procedure Component Value Units Date/Time    Chest AP Portable [[id number]] Collected:  01/22/18 1039    Order Status:  Completed Updated:  01/22/18 1043    Narrative:       Clinical History: Chest pain.    Findings: AP view of the chest. No prior  studies are available for  comparison.     Central silhouette is within normal limits for AP technique.     Patchy bilateral interstitial/airspace disease with perihilar and  basilar predominance could represent edema or infection.      Impression:        Bilateral interstitial/airspace disease.    Darra Lis, MD   01/22/2018 10:39 AM      EKG see above  Urinalysis pending    I have personally reviewed the patient's history and 24 hour interval events, along with vitals, labs, radiology images and  02 settings and additional findings found in detail within ICU team notes, with their care plans developed with and reviewed by me.    Time spent in patient evaluation and treatment in critical care excluding procedures, and not overlapping any other providers: 60    minutes.    Signed by: Durward Fortes, MD

## 2018-01-22 NOTE — ED Triage Notes (Signed)
Patient presents to the ED with lefts died chest pain and SOB since 2023-03-29 night. Patient is a constant 3/10. States that the SOB is worse with exertion.

## 2018-01-22 NOTE — ED Notes (Signed)
Keith Gates, 62 yo M.  NSTEMI. DKI, ARF, CHF.  No iso, no assist.  ICU

## 2018-01-22 NOTE — ED Notes (Signed)
[facility] Authorization Number [id number]

## 2018-01-22 NOTE — ED Provider Notes (Signed)
Physician/Midlevel provider first contact with patient: 01/22/18 0959         History     Chief Complaint   Patient presents with   . Chest Pain   . Shortness of Breath     62 y.o. With 3 days of sob. He had a cough 3 days ago which is better now. 3/10 Cp on left that is worse with exertion. No fevers/chills. He vomited 3 times 2 days ago. No abd pain. No diarrhea. He flew back from [place] on 01/11/18. Nothing makes symptoms better or worse.       The history is provided by the patient and the spouse.   Chest Pain   Associated symptoms: cough, shortness of breath and vomiting    Associated symptoms: no abdominal pain, no back pain, no dizziness, no fever, no headache, no nausea and no palpitations    Shortness of Breath   Associated symptoms: chest pain, cough and vomiting    Associated symptoms: no abdominal pain, no fever, no headaches, no rash and no sore throat             Past Medical History:   Diagnosis Date   . Diabetes mellitus    . Elevated cholesterol    . Hypertension    . Stroke        History reviewed. No pertinent surgical history.    History reviewed. No pertinent family history.    Social  Social History   Substance Use Topics   . Smoking status: Former Smoker     Years: 30.00     Quit date: 10/17/2002   . Smokeless tobacco: Never Used   . Alcohol use Yes       .     No Known Allergies    Home Medications     Med List Status:  Pharmacy Completed Set By: Barnet Glasgow at 01/22/2018 11:32 AM                atorvastatin (LIPITOR) 80 MG tablet     Take 80 mg by mouth daily         glipiZIDE (GLUCOTROL) 10 MG tablet     Take 10 mg by mouth 2 (two) times daily before meals         lisinopril (PRINIVIL,ZESTRIL) 5 MG tablet     Take 5 mg by mouth daily         metFORMIN (GLUCOPHAGE) 1000 MG tablet     Take 1,000 mg by mouth 2 (two) times daily with meals               Review of Systems   Constitutional: Negative for chills and fever.   HENT: Negative for congestion, rhinorrhea and sore throat.     Respiratory: Positive for cough and shortness of breath. Negative for chest tightness.    Cardiovascular: Positive for chest pain. Negative for palpitations.   Gastrointestinal: Positive for vomiting. Negative for abdominal pain, diarrhea and nausea.   Genitourinary: Negative for dysuria and frequency.   Musculoskeletal: Negative for back pain and myalgias.   Skin: Negative for color change and rash.   Neurological: Negative for dizziness and headaches.   Psychiatric/Behavioral: Negative for confusion. The patient is not nervous/anxious.        Physical Exam    BP: 111/82, Heart Rate: (!) 113, Temp: 97.8 F (36.6 C), Resp Rate: 15, SpO2: 98 %, Weight: 79.4 kg    Physical Exam   Constitutional: He is oriented to  person, place, and time. He appears well-developed and well-nourished.   HENT:   Head: Normocephalic and atraumatic.   Eyes: Pupils are equal, round, and reactive to light. Conjunctivae are normal.   Neck: Normal range of motion. Neck supple.   Cardiovascular: Regular rhythm and normal heart sounds.    tachycardic   Pulmonary/Chest: Effort normal and breath sounds normal. No respiratory distress. He has no wheezes. He has no rales.   Abdominal: Soft. He exhibits no distension. There is no tenderness. There is no rebound and no guarding.   Musculoskeletal: Normal range of motion. He exhibits no edema or tenderness.   Neurological: He is alert and oriented to person, place, and time. No cranial nerve deficit.   Very poor historian- says no cp then later says 3/10 cp   Skin: Skin is warm and dry. There is pallor.   Psychiatric: He has a normal mood and affect. His behavior is normal. Judgment and thought content normal.   Nursing note and vitals reviewed.        MDM and ED Course     ED Medication Orders     Start Ordered     Status Ordering Provider    01/22/18 1411 01/22/18 1410  dextrose  5 % and 0.9 % NaCl infusion  Continuous     Route: Intravenous     Last MAR action:  New Bag MENDIGUREN, IGNACIO I     01/22/18 1127 01/22/18 1126  piperacillin-tazobactam (ZOSYN) 2.25 g in sodium chloride 0.9 % 100 mL IVPB mini-bag plus  Once     Route: Intravenous  Ordered Dose: 2.25 g     Last MAR action:  Stopped Kery Batzel H    01/22/18 1120 01/22/18 1119  sodium chloride 0.9 % bolus 1,000 mL  Once     Route: Intravenous  Ordered Dose: 1,000 mL     Last MAR action:  Stopped Genavive Kubicki H    01/22/18 1108 01/22/18 1107  magnesium sulfate 1g in dextrose 5% IVPB (premix)  Once     Route: Intravenous  Ordered Dose: 1 g     Last MAR action:  Stopped Naoko Diperna H    01/22/18 1108 01/22/18 1107  magnesium sulfate 1g in dextrose 5% IVPB (premix)  Once     Route: Intravenous  Ordered Dose: 1 g     Last MAR action:  Stopped Raoul Ciano H    01/22/18 1105 01/22/18 1104  heparin (porcine) injection 4,000 Units  Once     Route: Intravenous  Ordered Dose: 4,000 Units     Last MAR action:  Given Chrishawn Kring H    01/22/18 1105 01/22/18 1104  heparin 25,000 units in dextrose 5% 500 mL infusion (premix)  Continuous     Route: Intravenous  Ordered Dose: 12 Units/kg/hr     Last MAR action:  Rate/Dose Change Jacksyn Beeks H    01/22/18 1047 01/22/18 1046    Once     Route: Intravenous  Ordered Dose: 1,000 mL     Discontinued Suhana Wilner H    01/22/18 1046 01/22/18 1045    Continuous     Route: Intravenous  Ordered Dose: 0.1 Units/kg/hr     Discontinued Lindberg Zenon H    01/22/18 1046 01/22/18 1045    Once     Route: Intravenous  Ordered Dose: 1 g     Discontinued Sylena Lotter H    01/22/18 1046 01/22/18 1045    Once  Route: Intravenous  Ordered Dose: 500 mg     Discontinued Doretta Remmert H    01/22/18 1045 01/22/18 1044  insulin regular (HumuLIN R,NovoLIN R) injection 8 Units  Once     Route: Intravenous  Ordered Dose: 8 Units     Last MAR action:  Given Jerusalen Mateja H    01/22/18 1009 01/22/18 1008  aspirin chewable tablet 324 mg  Once     Route: Oral  Ordered Dose: 324 mg     Last MAR action:  Given Verdell Kincannon H    01/22/18 1009 01/22/18 1008    Once      Route: Intravenous  Ordered Dose: 1,000 mL     Discontinued Caylan Schifano H             MDM  Number of Diagnoses or Management Options  Acute combined systolic and diastolic congestive heart failure:   Acute renal failure, unspecified acute renal failure type:   Diabetic ketoacidosis without coma associated with other specified diabetes mellitus:   Hypomagnesemia:   Non-STEMI (non-ST elevated myocardial infarction):   Shortness of breath:   Diagnosis management comments: Ddx: pneumonia, MI, PE, anemia  Plan: labs, cxr, cta, ivf, obs    I, Nita Sells, M.D, have been the primary provider for [name] during this Emergency Dept visit.  Oxygen saturation by pulse oximetry is 95%-100%, Normal.  Interventions: Patient Observed    EKG Interpretation by Nita Sells, MD, ED physician:  Rate:  Tachycardic  Rhythm:  Sinus Tachycardia  Axis:  Left  Conduction:  RBBB (Complete)  ST Segments:  St depression laterally  Other findings:  PARWP  Q Waves:  III aVf  Clinical Impression:  Non-specific EKG  Comparison to old ECG none    .  Pt in dka and has ischemic changes on ecg. I d/w Dr. Julius Bowels who was on for STEMI who reviewed ecg and agreed not a STEMI.    I d/w Dr. Marnette Burgess from cardiology 3 times who will consult. Wants heparin.    Pt with troponin of 9. Pt also meets septic criteria and was treated as septic pt. Pt and wife declined the 30 cc/kg bolus. .(The patient and/or family refused part or full amount of the 30cc/kg IVF bolus.). Pt in CHF but also hypotensive and needs fluids for sepsis. Will give gentle fluids.    I d/w Dr. Zadie Rhine who will admit pt to ICU.    I d/w Dr. Earlene Plater from [place] who agrees pt should stay at [facility] and will call back with authorization number.    Pt improved on re-exam.  He aware of findings and plan.    Pt also with low magnesium. He will get magnesium and heparin and abx and insulin gxx         Amount and/or Complexity of Data Reviewed  Clinical lab tests: ordered and reviewed  Tests in the  radiology section of CPT: reviewed and ordered  Obtain history from someone other than the patient: (wife)  Discuss the patient with other providers: yes (See above)    Risk of Complications, Morbidity, and/or Mortality  Presenting problems: high  Diagnostic procedures: high  Management options: high    Critical Care  Total time providing critical care: 30-74 minutes    Patient Progress  Patient progress: improved                   Critical Care  Performed by: Leticia Clas  Authorized by: Kirstie Mirza  I     Critical care provider statement:     Critical care time (minutes):  65    Critical care time was exclusive of:  Separately billable procedures and treating other patients    Critical care was necessary to treat or prevent imminent or life-threatening deterioration of the following conditions:  Cardiac failure, circulatory failure, dehydration, endocrine crisis and sepsis    Critical care was time spent personally by me on the following activities:  Blood draw for specimens, development of treatment plan with patient or surrogate, discussions with consultants, evaluation of patient's response to treatment, examination of patient, discussions with primary provider, interpretation of cardiac output measurements, obtaining history from patient or surrogate, ordering and performing treatments and interventions, ordering and review of laboratory studies, ordering and review of radiographic studies, pulse oximetry and re-evaluation of patient's condition        Clinical Impression & Disposition     Clinical Impression  Final diagnoses:   Non-STEMI (non-ST elevated myocardial infarction)   Diabetic ketoacidosis without coma associated with other specified diabetes mellitus   Shortness of breath   Acute combined systolic and diastolic congestive heart failure   Acute renal failure, unspecified acute renal failure type   Hypomagnesemia   Lactic acidosis        ED Disposition     ED Disposition Condition Date/Time  Comment    Admit  Mon Jan 22, 2018 11:10 AM Admitting Physician: Kirstie Mirza I [phone]   Diagnosis: Non-STEMI (non-ST elevated myocardial infarction) [811914]   Estimated Length of Stay: > or = to 2 midnights   Tentative Discharge Plan?: Skilled Nursing Facility [3]   Patient Class: Inpatient [101]             Current Discharge Medication List                    Leticia Clas, MD  01/23/18 [phone]

## 2018-01-22 NOTE — Consults (Signed)
[place] [facility] CARDIOLOGY CONSULTATION REPORT  [facility]    Date Time: 01/22/18 2:10 PM  Patient Name: Keith Gates  Requesting Physician: Leticia Clas, MD       Reason for Consultation:   Shortness of breath      History:   Lantz Hermann is a 62 y.o. male admitted on 01/22/2018.  We have been asked by Leticia Clas, MD,  to provide cardiac consultation, regarding shortness of breath.  Patient has no history of cardiac disease and has seen a Neurosurgeon cardiologist at some point in the past.  He was working in [place] for [employer] and returned a few weeks ago.  Noticed primarily shortness of breath associated with chest pain.  Shortness of breath progressively worsened to moderate intensity and occurred at rest along with exertion.  Did not feel that legs were swollen or abdomen bloated.  The chest pain was associated with deep breathing and overall mild intensity.  No other associated, alleviating, or aggravating factors.  On presentation, troponin and BNP elevated.  ECG shows lateral ST depression.  Dr. Kerry Hough discussed ECG with interventionalist on call and medical therapy was advised.    Past Medical History:     Past Medical History:   Diagnosis Date   . Diabetes mellitus    . Elevated cholesterol    . Hypertension    . Stroke        Past Surgical History:   History reviewed. No pertinent surgical history.    Family History:   History reviewed. No pertinent family history.    Social History:     Social History     Social History   . Marital status: Married     Spouse name: N/A   . Number of children: N/A   . Years of education: N/A     Social History Main Topics   . Smoking status: Former Smoker     Years: 30.00     Quit date: 10/17/2002   . Smokeless tobacco: Never Used   . Alcohol use Yes   . Drug use: No   . Sexual activity: Not on file     Other Topics Concern   . Not on file     Social History Narrative   . No narrative on file       Allergies:   No Known Allergies    Medications:     (Not in a  hospital admission)      Current Facility-Administered Medications   Medication Dose Route Frequency Provider Last Rate Last Dose   . dextrose  5 % and 0.9 % NaCl infusion   Intravenous Continuous Leticia Clas, MD       . heparin 25,000 units in dextrose 5% 500 mL infusion (premix)  12 Units/kg/hr Intravenous Continuous Leticia Clas, MD 19.1 mL/hr at 01/22/18 1138 12 Units/kg/hr at 01/22/18 1138   . insulin regular (HumuLIN R,NovoLIN R) 100 Units in sodium chloride 0.9 % 100 mL IV infusion  0.1 Units/kg/hr Intravenous Continuous Leticia Clas, MD 4 mL/hr at 01/22/18 1337 0.05 Units/kg/hr at 01/22/18 1337     Current Outpatient Prescriptions   Medication Sig Dispense Refill   . atorvastatin (LIPITOR) 80 MG tablet Take 80 mg by mouth daily         . glipiZIDE (GLUCOTROL) 10 MG tablet Take 10 mg by mouth 2 (two) times daily before meals         . lisinopril (PRINIVIL,ZESTRIL) 5 MG tablet  Take 5 mg by mouth daily         . metFORMIN (GLUCOPHAGE) 1000 MG tablet Take 1,000 mg by mouth 2 (two) times daily with meals               Review of Systems:    Comprehensive review of systems including constitutional, eyes, ears, nose, mouth, throat, cardiovascular, GI, GU, musculoskeletal, integumentary, respiratory, neurologic, psychiatric, and endocrine is negative other than what is mentioned already in the history of present illness    Physical Exam:     Vitals:    01/22/18 1331   BP: 108/78   Pulse: (!) 103   Resp: 20   Temp:    SpO2: 98%     Temp (24hrs), Avg:97.8 F (36.6 C), Min:97.8 F (36.6 C), Max:97.8 F (36.6 C)      Intake and Output Summary (Last 24 hours) at Date Time    Intake/Output Summary (Last 24 hours) at 01/22/18 1410  Last data filed at 01/22/18 1409   Gross per 24 hour   Intake             2300 ml   Output                0 ml   Net             2300 ml       GENERAL: Patient is in no acute distress   HEENT: No scleral icterus or conjunctival pallor, moist mucous membranes   NECK: No jugular venous distention  or thyromegaly, normal carotid upstrokes without bruits   CARDIAC: Normal apical impulse, regular rate and rhythm, with normal S1 and S2, systolic murmur LSB  CHEST: Clear to auscultation bilaterally, normal respiratory effort  ABDOMEN: No abdominal bruits, masses, or hepatosplenomegaly, nontender, non-distended, good bowel sounds   EXTREMITIES: No clubbing, cyanosis, or edema, 2+ DP, PT, and femoral pulses bilaterally without bruits  SKIN: No rash or jaundice   NEUROLOGIC: Alert and oriented to time, place and person, normal mood and affect    MUSCULOSKELETAL: Normal muscle strength and tone.      Labs Reviewed:       Recent Labs  Lab 01/22/18  1010   Troponin I 9.25*               Recent Labs  Lab 01/22/18  1010   Bilirubin, Total 0.5   Protein, Total 7.0   Albumin 3.7   ALT 22   AST (SGOT) 39*       Recent Labs  Lab 01/22/18  1010   Magnesium 1.1*       Recent Labs  Lab 01/22/18  1010   PT 15.0   PT INR 1.2*   PTT 27       Recent Labs  Lab 01/22/18  1010   WBC 17.98*   Hgb 14.3   Hematocrit 42.9   Platelets 210       Recent Labs  Lab 01/22/18  1010   Sodium 137   Potassium 5.0   Chloride 101   CO2 20*   BUN 47.6*   Creatinine 2.3*   EGFR 29.2   Glucose 444*   Calcium 9.6         Radiology   Radiological Procedure reviewed.      chest X-ray  Assessment:    Acute NSTEMI.  Troponin 9 on presentation with ST depression   Elevated BNP 2000 and clinical signs of CHF   DKA  ARF   DM    Recommendations:    IV heparin and PO aspirin   BB as BP allows   No ACEI/ARB due to low BP   2D echo to check EF   Follow serial troponin.  Will need cath if renal function improves or Nuc if it doesn't when more medically stable.   Treat DKA    Signed by: Sherlean Foot, MD      [facility]  NP [employer] [phone] (8am-5pm)  MD [employer] [phone] (8am-5pm)  After hours, non urgent consult line [phone]  After Hours, urgent consults [phone]

## 2018-01-22 NOTE — UM Notes (Signed)
UR Review 01/22/18    62 yr old presented to ED on 01/22/18 and placed on IP status for NSTEMI        MD Summary:      Assessment:  62 yr old With 3 days of sob. He had a cough 3 days ago which is better now. 3/10 Cp on left that is worse with exertion. No fevers/chills. He vomited 3 times 2 days ago    VS: t 97.8, hr 115, rr 18, 90/77    Labs: wbc 17.98, glucose 444, bun 47.6, cr 2.3, co2 20, ast 39, alk phos 147, anion gap 16, troponin 9.25, INR 1.2, mag 1.1, bnp 2590.1, lactic acid 7.1,     EKG:   SINUS TACHYCARDIA  LEFT AXIS DEVIATION  RIGHT BUNDLE BRANCH BLOCK  INFERIOR MYOCARDIAL INFARCTION , AGE UNDETERMINED  T WAVE ABNORMALITY, CONSIDER LATERAL ISCHEMIA  ABNORMAL ECG        CXR: Bilateral interstitial/airspace disease.    ED Meds:  Aspirin 234mg  PO  NS bolus  Insulin 8 units IV  Heparin 4000 units IV  Heparin 12 units/kg/hr continuous infusion  Mag sulfate 1g IV X 2  Zosyn 4.5g IV    DX:    Non-STEMI (non-ST elevated myocardial infarction)    Diabetic ketoacidosis without coma associated with other specified diabetes mellitus    Shortness of breath    Acute combined systolic and diastolic congestive heart failure    Acute renal failure, unspecified acute renal failure type    Hypomagnesemia

## 2018-01-22 NOTE — Plan of Care (Signed)
Problem: Safety  Goal: Patient will be free from injury during hospitalization  Outcome: Progressing   01/22/18 1805   Goal/Interventions addressed this shift   Patient will be free from injury during hospitalization  Assess patient's risk for falls and implement fall prevention plan of care per policy;Provide and maintain safe environment;Use appropriate transfer methods;Ensure appropriate safety devices are available at the bedside;Include patient/ family/ care giver in decisions related to safety;Assess for patients risk for elopement and implement Elopement Risk Plan per policy;Hourly rounding;Provide alternative method of communication if needed (communication boards, writing)

## 2018-01-22 NOTE — Plan of Care (Signed)
Problem: Safety  Goal: Patient will be free from injury during hospitalization  Outcome: Progressing   01/22/18 2010   Goal/Interventions addressed this shift   Patient will be free from injury during hospitalization  Assess patient's risk for falls and implement fall prevention plan of care per policy;Provide and maintain safe environment;Use appropriate transfer methods;Ensure appropriate safety devices are available at the bedside;Include patient/ family/ care giver in decisions related to safety;Hourly rounding       Problem: Hemodynamic Status: Cardiac  Goal: Stable vital signs and fluid balance  Outcome: Progressing   01/22/18 2010   Goal/Interventions addressed this shift   Stable vital signs and fluid balance Monitor/assess vital signs and telemetry per unit protocol;Weigh on admission and record weight daily;Assess signs and symptoms associated with cardiac rhythm changes;Monitor intake/output per unit protocol and/or LIP order;Monitor lab values;Monitor for leg swelling/edema and report to LIP if abnormal

## 2018-01-22 NOTE — ED Notes (Signed)
POC BG 168.  MD Mendiguren notified.  OK to hold insulin gtt for 1 hour and recheck.

## 2018-01-23 ENCOUNTER — Ambulatory Visit (INDEPENDENT_AMBULATORY_CARE_PROVIDER_SITE_OTHER): Payer: Self-pay

## 2018-01-23 ENCOUNTER — Inpatient Hospital Stay: Payer: PRIVATE HEALTH INSURANCE

## 2018-01-23 LAB — PT AND APTT
PT INR: 1.2 — ABNORMAL HIGH (ref 0.9–1.1)
PT: 15.3 s — ABNORMAL HIGH (ref 12.6–15.0)

## 2018-01-23 LAB — COMPREHENSIVE METABOLIC PANEL
ALT: 18 U/L (ref 0–55)
ALT: 15 U/L (ref 0–55)
AST (SGOT): 34 U/L (ref 5–34)
AST (SGOT): 26 U/L (ref 5–34)
Albumin/Globulin Ratio: 1.1 (ref 0.9–2.2)
Albumin/Globulin Ratio: 1.2 (ref 0.9–2.2)
Albumin: 3.1 g/dL — ABNORMAL LOW (ref 3.5–5.0)
Albumin: 2.6 g/dL — ABNORMAL LOW (ref 3.5–5.0)
Alkaline Phosphatase: 122 U/L — ABNORMAL HIGH (ref 38–106)
Alkaline Phosphatase: 103 U/L (ref 38–106)
Anion Gap: 11 (ref 5.0–15.0)
Anion Gap: 9 (ref 5.0–15.0)
BUN: 40.6 mg/dL — ABNORMAL HIGH (ref 9.0–28.0)
BUN: 35.7 mg/dL — ABNORMAL HIGH (ref 9.0–28.0)
Bilirubin, Total: 0.6 mg/dL (ref 0.2–1.2)
Bilirubin, Total: 0.4 mg/dL (ref 0.2–1.2)
CO2: 21 mEq/L — ABNORMAL LOW (ref 22–29)
CO2: 19 mEq/L — ABNORMAL LOW (ref 22–29)
Calcium: 8.6 mg/dL (ref 8.5–10.5)
Calcium: 7.3 mg/dL — ABNORMAL LOW (ref 8.5–10.5)
Chloride: 107 mEq/L (ref 100–111)
Chloride: 113 mEq/L — ABNORMAL HIGH (ref 100–111)
Creatinine: 1.6 mg/dL — ABNORMAL HIGH (ref 0.7–1.3)
Creatinine: 1.2 mg/dL (ref 0.7–1.3)
Globulin: 2.7 g/dL (ref 2.0–3.6)
Globulin: 2.2 g/dL (ref 2.0–3.6)
Glucose: 260 mg/dL — ABNORMAL HIGH (ref 70–100)
Glucose: 195 mg/dL — ABNORMAL HIGH (ref 70–100)
Potassium: 4.6 mEq/L (ref 3.5–5.1)
Potassium: 3.7 mEq/L (ref 3.5–5.1)
Protein, Total: 5.8 g/dL — ABNORMAL LOW (ref 6.0–8.3)
Protein, Total: 4.8 g/dL — ABNORMAL LOW (ref 6.0–8.3)
Sodium: 139 mEq/L (ref 136–145)
Sodium: 141 mEq/L (ref 136–145)

## 2018-01-23 LAB — PHOSPHORUS: Phosphorus: 3.3 mg/dL (ref 2.3–4.7)

## 2018-01-23 LAB — LIPID PANEL
Cholesterol / HDL Ratio: 3
Cholesterol: 92 mg/dL (ref 0–199)
HDL: 31 mg/dL — ABNORMAL LOW (ref 40–9999)
LDL Calculated: 46 mg/dL (ref 0–99)
Triglycerides: 73 mg/dL (ref 34–149)
VLDL Calculated: 15 mg/dL (ref 10–40)

## 2018-01-23 LAB — RESPIRATORY PATHOGEN PANEL, PCR (FILMARRAY) (SOFT)
Adenovirus: NOT DETECTED
Bordetella pertussis: NOT DETECTED
Chlamydophila pneumoniae: NOT DETECTED
Coronavirus 229E: NOT DETECTED
Coronavirus HKU1: NOT DETECTED
Coronavirus NL63: NOT DETECTED
Coronavirus OC43: NOT DETECTED
Human Metapneumovirus: NOT DETECTED
Human Rhinovirus/Enterovirus: NOT DETECTED
Influenza A/H1: NOT DETECTED
Influenza A/H3: NOT DETECTED
Influenza A: NOT DETECTED
Influenza AH1 - 2009: NOT DETECTED
Influenza B: NOT DETECTED
Mycoplasma pneumoniae: NOT DETECTED
Parainfluenza Virus 1: NOT DETECTED
Parainfluenza Virus 2: NOT DETECTED
Parainfluenza Virus 3: NOT DETECTED
Parainfluenza Virus 4: NOT DETECTED
Respiratory Syncytial Virus: NOT DETECTED

## 2018-01-23 LAB — CBC AND DIFFERENTIAL
Absolute NRBC: 0 10*3/uL (ref 0.00–0.00)
Basophils Absolute Automated: 0.02 10*3/uL (ref 0.00–0.08)
Basophils Automated: 0.1 %
Eosinophils Absolute Automated: 0 10*3/uL (ref 0.00–0.44)
Eosinophils Automated: 0 %
Hematocrit: 35.6 % — ABNORMAL LOW (ref 37.6–49.6)
Hgb: 11.9 g/dL — ABNORMAL LOW (ref 12.5–17.1)
Immature Granulocytes Absolute: 0.07 10*3/uL (ref 0.00–0.07)
Immature Granulocytes: 0.5 %
Lymphocytes Absolute Automated: 1 10*3/uL (ref 0.42–3.22)
Lymphocytes Automated: 6.9 %
MCH: 29.7 pg (ref 25.1–33.5)
MCHC: 33.4 g/dL (ref 31.5–35.8)
MCV: 88.8 fL (ref 78.0–96.0)
MPV: 13.2 fL — ABNORMAL HIGH (ref 8.9–12.5)
Monocytes Absolute Automated: 0.68 10*3/uL (ref 0.21–0.85)
Monocytes: 4.7 %
Neutrophils Absolute: 12.7 10*3/uL — ABNORMAL HIGH (ref 1.10–6.33)
Neutrophils: 87.8 %
Nucleated RBC: 0 /100 WBC (ref 0.0–0.0)
Platelets: 168 10*3/uL (ref 142–346)
RBC: 4.01 10*6/uL — ABNORMAL LOW (ref 4.20–5.90)
RDW: 14 % (ref 11–15)
WBC: 14.47 10*3/uL — ABNORMAL HIGH (ref 3.10–9.50)

## 2018-01-23 LAB — HEMOGLOBIN A1C
Average Estimated Glucose: 154.2 mg/dL
Hemoglobin A1C: 7 % — ABNORMAL HIGH (ref 4.6–5.9)

## 2018-01-23 LAB — HEMOLYSIS INDEX: Hemolysis Index: 7 (ref 0–18)

## 2018-01-23 LAB — TROPONIN I: Troponin I: 11.4 ng/mL (ref 0.00–0.09)

## 2018-01-23 LAB — GFR
EGFR: 44.3
EGFR: >60

## 2018-01-23 LAB — MAGNESIUM: Magnesium: 1.7 mg/dL (ref 1.6–2.6)

## 2018-01-23 LAB — APTT
PTT: 65 s — ABNORMAL HIGH (ref 23–37)
PTT: 71 s — ABNORMAL HIGH (ref 23–37)

## 2018-01-23 LAB — LACTIC ACID, PLASMA: Lactic Acid: 2.1 mmol/L — ABNORMAL HIGH (ref 0.2–2.0)

## 2018-01-23 LAB — GLUCOSE WHOLE BLOOD - POCT
Whole Blood Glucose POCT: 252 mg/dL — ABNORMAL HIGH (ref 70–100)
Whole Blood Glucose POCT: 175 mg/dL — ABNORMAL HIGH (ref 70–100)

## 2018-01-23 MED ORDER — SODIUM CHLORIDE 0.9 % IV SOLN
INTRAVENOUS | Status: DC
Start: 2018-01-23 — End: 2018-01-23

## 2018-01-23 MED ORDER — HEPARIN SODIUM (PORCINE) 5000 UNIT/ML IJ SOLN
40.00 [IU]/kg | INTRAMUSCULAR | Status: DC | PRN
Start: 2018-01-23 — End: 2018-01-23

## 2018-01-23 MED ORDER — LEVOFLOXACIN 250 MG PO TABS
250.00 mg | ORAL_TABLET | Freq: Every day | ORAL | Status: DC
Start: 2018-01-23 — End: 2018-01-23
  Administered 2018-01-23: 11:00:00 250 mg via ORAL
  Filled 2018-01-23: qty 1

## 2018-01-23 MED ORDER — CEFTRIAXONE SODIUM 1 G IJ SOLR
1.00 g | INTRAMUSCULAR | Status: DC
Start: 2018-01-23 — End: 2018-01-23
  Administered 2018-01-23: 01:00:00 1 g via INTRAVENOUS
  Filled 2018-01-23: qty 1000

## 2018-01-23 MED ORDER — HEPARIN (PORCINE) IN D5W 50-5 UNIT/ML-% IV SOLN (UNITS/KG/HR ONLY)
18.00 [IU]/kg/h | INTRAVENOUS | Status: DC
Start: 2018-01-23 — End: 2018-01-23

## 2018-01-23 MED ORDER — HEPARIN SODIUM (PORCINE) 5000 UNIT/ML IJ SOLN
80.00 [IU]/kg | INTRAMUSCULAR | Status: DC | PRN
Start: 2018-01-23 — End: 2018-01-23

## 2018-01-23 MED ORDER — METOPROLOL TARTRATE 25 MG PO TABS
12.50 mg | ORAL_TABLET | Freq: Four times a day (QID) | ORAL | Status: DC
Start: 2018-01-23 — End: 2018-01-23

## 2018-01-23 MED ORDER — FUROSEMIDE 10 MG/ML IJ SOLN
40.00 mg | Freq: Once | INTRAMUSCULAR | Status: AC
Start: 2018-01-23 — End: 2018-01-23
  Administered 2018-01-23: 09:00:00 40 mg via INTRAVENOUS
  Filled 2018-01-23: qty 4

## 2018-01-23 MED ORDER — CARVEDILOL 3.125 MG PO TABS
3.1250 mg | ORAL_TABLET | Freq: Two times a day (BID) | ORAL | Status: DC
Start: 2018-01-23 — End: 2018-01-23
  Administered 2018-01-23: 11:00:00 3.125 mg via ORAL
  Filled 2018-01-23: qty 1

## 2018-01-23 NOTE — Nursing Note (Signed)
 Trop critical 10.1 ( trop peaked at [place] at 12.4) Dr. Tobie aware. Pt denies CP/SOB/N/V.

## 2018-01-23 NOTE — Progress Notes (Signed)
01/23/18 1532   Discharge Disposition   Patient preference/choice provided? Yes   Physical Discharge Disposition Another Acute Hospital   Receiving facility, unit and room number: [facility] center, room 645   Mode of Transportation Ambulance  [facility] arranged the ambulance transportation )   Patient/Family/POA notified of transfer plan Yes;Patient informed only  (MD talked to the patient and his wife, they both agreed for transfer)   Patient agreeable to discharge plan/expected d/c date? Yes   Family/POA agreeable to discharge plan/expected d/c date? Yes   Bedside nurse notified of transport plan? Yes

## 2018-01-23 NOTE — Progress Notes (Signed)
Case Manager left message for the [place] office at [phone] to let them know that a Keith Gates patient is in the [facility] in the ICU.  Dr. Nedra Hai is covering today and she can be reached at [phone] for [facility] physician to discuss about this patient.

## 2018-01-23 NOTE — Plan of Care (Signed)
Problem: Hemodynamic Status: Cardiac  Goal: Stable vital signs and fluid balance  Outcome: Progressing   01/23/18 1210   Goal/Interventions addressed this shift   Stable vital signs and fluid balance Monitor/assess vital signs and telemetry per unit protocol;Weigh on admission and record weight daily;Assess signs and symptoms associated with cardiac rhythm changes;Monitor intake/output per unit protocol and/or LIP order;Monitor lab values;Monitor for leg swelling/edema and report to LIP if abnormal

## 2018-01-23 NOTE — Unmapped External Note (Signed)
Problem: Pain - Adult  Goal: Verbalizes/displays adequate comfort level or baseline comfort level  Outcome: Progressing     Problem: Risk for Venous Thrombosis Embolism  Goal: Absence of Venous Thrombosis Embolism  Outcome: Progressing     Problem: Risk for Nosocomial Infection  Goal: Patient will remain free of nosocomial Infections  Outcome: Progressing     Problem: Safety Adult - Fall  Goal: Free from fall injury  Outcome: Progressing     Problem: Discharge Planning  Goal: Discharge to home or other facility with appropriate resources  Outcome: Progressing   Goals:          Possible barriers to meeting goals or advancing the plan of care:        Comments on overall evaluation of advancement of plan of care:

## 2018-01-23 NOTE — Progress Notes (Signed)
Patient left to [facility] accompained by [facility]. Bryan from company has taken one IV pump. Pump number in log book with contact information for [facility]. Patient has normal vital signs, BP at 115/86, RR18. SPO2 98, Temp Temporal 97.58F. Patient on continuous heparin dosage. Please see below for units/kg/hr.    Report also given to receiving RN at [facility], at [phone]. The RN has received the latest update regarding changing in nomogram for heparin based on latest echocardiogram results. Patient is on 13 units/kg/hr of heparin based on aPTT of 71 results as of today 01/23/18 at 1200 PM.

## 2018-01-23 NOTE — Nursing Note (Signed)
 Heparin  gtt rate increased to 1100units/hr per MD order rate verified w/ Delon RN

## 2018-01-23 NOTE — Discharge Instr - AVS First Page (Signed)
Reason for your Hospital Admission:  Chest pain      Instructions for after your discharge:you will be discharged to [facility] facility to continue your treatment of your heart condition.

## 2018-01-23 NOTE — Final Progress Note (DC Note for stay less than 48 (Signed)
Date:01/23/2018   Patient Name: Keith Gates  Attending Physician: Kirstie Mirza I, MD  Today:   BP 112/86   Pulse (!) 102   Temp 97.6 F (36.4 C) (Temporal Artery)   Resp 18   Ht 1.753 m (5\' 9" )   Wt 79.4 kg (175 lb)   SpO2 97%   BMI 25.84 kg/m   Ranges for the last 24 hours:  Temp:  [97.5 F (36.4 C)-98.4 F (36.9 C)] 97.6 F (36.4 C)  Heart Rate:  [94-109] 102  Resp Rate:  [13-32] 18  BP: (96-146)/(60-105) 112/86     General: nonacute distress, alert, interactive, breathing comfortably  CV: normal S1, normal S2, no murmur/rub/gallop  Pulm: Mild bibasilar crackles , otherwise clear, no wheezing  Abdomen: Nontender, nondistended, positive bowel sounds  Extremities: no cyanosis, no edema, +2 pedal pulses, warm      Date of Admission:   01/22/2018    Date of Discharge:   01/23/2018    Outcome of Hospitalization:   Active Problems:    Non-STEMI (non-ST elevated myocardial infarction)  Resolved Problems:    * No resolved hospital problems. *     Significant Events: 62 y.o. male with history of hypertension, diabetes and hypercholesterolemia who presented to the hospital with NSTEMI.    Lab Results last 48 Hours     Procedure Component Value Units Date/Time    Comprehensive metabolic panel [[id number]]  (Abnormal) Collected:  01/23/18 1159    Specimen:  Blood Updated:  01/23/18 1234     Glucose 195 (H) mg/dL      BUN 40.9 (H) mg/dL      Creatinine 1.2 mg/dL      Sodium 811 mEq/L      Potassium 3.7 mEq/L      Chloride 113 (H) mEq/L      CO2 19 (L) mEq/L      Calcium 7.3 (L) mg/dL      Protein, Total 4.8 (L) g/dL      Albumin 2.6 (L) g/dL      AST (SGOT) 26 U/L      ALT 15 U/L      Alkaline Phosphatase 103 U/L      Bilirubin, Total 0.4 mg/dL      Globulin 2.2 g/dL      Albumin/Globulin Ratio 1.2     Anion Gap 9.0    GFR [[id number]] Collected:  01/23/18 1159     Updated:  01/23/18 1234     EGFR >60.0    Glucose Whole Blood - POCT [[id number]]  (Abnormal) Collected:  01/23/18 1155     Updated:  01/23/18 1223      POCT - Glucose Whole blood 175 (H) mg/dL     APTT - STAT followed by TIMED [[id number]]  (Abnormal) Collected:  01/23/18 1158     Updated:  01/23/18 1217     PTT 71 (H) sec      APTT Anticoag. Given w/i 48 hrs. heparin    Narrative:       When therapeutic range is reached per protocol, change aPTT  frequency to daily at [facility] daily at 0400 until heparin is  discontinued, except for ICU patients - continue q8h aPTTs.    Respiratory Pathogen Panel, PCR (Film Array) [[id number]] Collected:  01/23/18 0810     Updated:  01/23/18 1100    Narrative:       Viral Transport Media (VTM)    Hemoglobin A1C [[id number]]  (Abnormal) Collected:  01/23/18 0359    Specimen:  Blood Updated:  01/23/18 0817     Hemoglobin A1C 7.0 (H) %      Average Estimated Glucose 154.2 mg/dL     Glucose Whole Blood - POCT [[id number]]  (Abnormal) Collected:  01/23/18 0749     Updated:  01/23/18 0815     POCT - Glucose Whole blood 252 (H) mg/dL     Lipid panel [[id number]]  (Abnormal) Collected:  01/23/18 0359    Specimen:  Blood Updated:  01/23/18 0803     Cholesterol 92 mg/dL      Triglycerides 73 mg/dL      HDL 31 (L) mg/dL      LDL Calculated 46 mg/dL      VLDL Cholesterol Cal 15 mg/dL      CHOL/HDL Ratio 3.0    Hemolysis index [[id number]] Collected:  01/23/18 0359     Updated:  01/23/18 0803     Hemolysis Index 7    Comprehensive metabolic panel [[id number]]  (Abnormal) Collected:  01/23/18 0359    Specimen:  Blood Updated:  01/23/18 0449     Glucose 260 (H) mg/dL      BUN 84.1 (H) mg/dL      Creatinine 1.6 (H) mg/dL      Sodium 324 mEq/L      Potassium 4.6 mEq/L      Chloride 107 mEq/L      CO2 21 (L) mEq/L      Calcium 8.6 mg/dL      Protein, Total 5.8 (L) g/dL      Albumin 3.1 (L) g/dL      AST (SGOT) 34 U/L      ALT 18 U/L      Alkaline Phosphatase 122 (H) U/L      Bilirubin, Total 0.6 mg/dL      Globulin 2.7 g/dL      Albumin/Globulin Ratio 1.1     Anion Gap 11.0    Magnesium [[id number]] Collected:  01/23/18 0359    Specimen:  Blood Updated:   01/23/18 0449     Magnesium 1.7 mg/dL     Phosphorus [[id number]] Collected:  01/23/18 0359    Specimen:  Blood Updated:  01/23/18 0449     Phosphorus 3.3 mg/dL     GFR [[id number]] Collected:  01/23/18 0359     Updated:  01/23/18 0449     EGFR 44.3    Troponin I [[id number]]  (Abnormal) Collected:  01/23/18 0359    Specimen:  Blood Updated:  01/23/18 0446     Troponin I 11.40 (HH) ng/mL     Narrative:       When therapeutic range is reached per protocol, change aPTT  frequency to daily at [facility] daily at 0400 until heparin is  discontinued, except for ICU patients - continue q8h aPTTs.    APTT - STAT followed by TIMED [[id number]]  (Abnormal) Collected:  01/23/18 0359     Updated:  01/23/18 0439     PTT 65 (H) sec     Narrative:       When therapeutic range is reached per protocol, change aPTT  frequency to daily at [facility] daily at 0400 until heparin is  discontinued, except for ICU patients - continue q8h aPTTs.    PT/APTT [[id number]]  (Abnormal) Collected:  01/23/18 0359     Updated:  01/23/18 0439     PT 15.3 (H) sec      PT INR 1.2 (H)     PT Anticoag. Given Within 48 hrs. heparin  Narrative:       When therapeutic range is reached per protocol, change aPTT  frequency to daily at [facility] daily at 0400 until heparin is  discontinued, except for ICU patients - continue q8h aPTTs.    Lactic acid, plasma [[id number]]  (Abnormal) Collected:  01/23/18 0358    Specimen:  Blood Updated:  01/23/18 0425     Lactic acid 2.1 (H) mmol/L     CBC and differential [[id number]]  (Abnormal) Collected:  01/23/18 0359    Specimen:  Blood from Blood Updated:  01/23/18 0408     WBC 14.47 (H) x10 3/uL      Hgb 11.9 (L) g/dL      Hematocrit 78.2 (L) %      Platelets 168 x10 3/uL      RBC 4.01 (L) x10 6/uL      MCV 88.8 fL      MCH 29.7 pg      MCHC 33.4 g/dL      RDW 14 %      MPV 13.2 (H) fL      Neutrophils 87.8 %      Lymphocytes Automated 6.9 %      Monocytes 4.7 %      Eosinophils Automated 0.0 %      Basophils Automated 0.1 %      Immature  Granulocyte 0.5 %      Nucleated RBC 0.0 /100 WBC      Neutrophils Absolute 12.70 (H) x10 3/uL      Abs Lymph Automated 1.00 x10 3/uL      Abs Mono Automated 0.68 x10 3/uL      Abs Eos Automated 0.00 x10 3/uL      Absolute Baso Automated 0.02 x10 3/uL      Absolute Immature Granulocyte 0.07 x10 3/uL      Absolute NRBC 0.00 x10 3/uL     Glucose Whole Blood - POCT [[id number]]  (Abnormal) Collected:  01/22/18 2214     Updated:  01/22/18 2217     POCT - Glucose Whole blood 231 (H) mg/dL     Troponin I [[id number]]  (Abnormal) Collected:  01/22/18 2000    Specimen:  Blood Updated:  01/22/18 2044     Troponin I 12.20 (HH) ng/mL     Comprehensive metabolic panel [[id number]]  (Abnormal) Collected:  01/22/18 2001    Specimen:  Blood Updated:  01/22/18 2035     Glucose 211 (H) mg/dL      BUN 01.0 (H) mg/dL      Creatinine 1.8 (H) mg/dL      Sodium 272 mEq/L      Potassium 4.5 mEq/L      Chloride 107 mEq/L      CO2 21 (L) mEq/L      Calcium 8.5 mg/dL      Protein, Total 5.8 (L) g/dL      Albumin 3.2 (L) g/dL      AST (SGOT) 38 (H) U/L      ALT 19 U/L      Alkaline Phosphatase 123 (H) U/L      Bilirubin, Total 0.5 mg/dL      Globulin 2.6 g/dL      Albumin/Globulin Ratio 1.2     Anion Gap 11.0    GFR [[id number]] Collected:  01/22/18 2001     Updated:  01/22/18 2035     EGFR 38.7    APTT - STAT followed by TIMED [[id number]]  (Abnormal) Collected:  01/22/18 2001  Updated:  01/22/18 2020     PTT 53 (H) sec      APTT Anticoag. Given w/i 48 hrs. heparin    Narrative:       When therapeutic range is reached per protocol, change aPTT  frequency to daily at [facility] daily at 0400 until heparin is  discontinued, except for ICU patients - continue q8h aPTTs.    Lactic acid, plasma [[id number]]  (Abnormal) Collected:  01/22/18 2001    Specimen:  Blood Updated:  01/22/18 2019     Lactic acid 2.6 (H) mmol/L     MRSA culture (If not done in Triage) [[id number]] Collected:  01/22/18 1627    Specimen:  Body Fluid from Nares Updated:  01/22/18 1917     Glucose Whole Blood - POCT [[id number]]  (Abnormal) Collected:  01/22/18 1820     Updated:  01/22/18 1832     POCT - Glucose Whole blood 195 (H) mg/dL     Glucose Whole Blood - POCT [[id number]]  (Abnormal) Collected:  01/22/18 1725     Updated:  01/22/18 1733     POCT - Glucose Whole blood 230 (H) mg/dL     Troponin I [[id number]]  (Abnormal) Collected:  01/22/18 1610    Specimen:  Blood Updated:  01/22/18 1646     Troponin I 11.46 (HH) ng/mL     Basic Metabolic Panel [[id number]]  (Abnormal) Collected:  01/22/18 1610    Specimen:  Blood Updated:  01/22/18 1636     Glucose 292 (H) mg/dL      BUN 53.6 (H) mg/dL      Creatinine 1.9 (H) mg/dL      Calcium 8.6 mg/dL      Sodium 644 mEq/L      Potassium 4.7 mEq/L      Chloride 103 mEq/L      CO2 23 mEq/L      Anion Gap 12.0    Magnesium [[id number]] Collected:  01/22/18 1610    Specimen:  Blood Updated:  01/22/18 1636     Magnesium 1.6 mg/dL     GFR [[id number]] Collected:  01/22/18 1610     Updated:  01/22/18 1636     EGFR 36.3    Glucose Whole Blood - POCT [[id number]]  (Abnormal) Collected:  01/22/18 1621     Updated:  01/22/18 1634     POCT - Glucose Whole blood 220 (H) mg/dL     Glucose Whole Blood - POCT [[id number]]  (Abnormal) Collected:  01/22/18 1433     Updated:  01/22/18 1547     POCT - Glucose Whole blood 168 (H) mg/dL     Glucose Whole Blood - POCT [[id number]]  (Abnormal) Collected:  01/22/18 1543     Updated:  01/22/18 1547     POCT - Glucose Whole blood 173 (H) mg/dL     Blood Culture Aerobic/Anaerobic #2 [[id number]] Collected:  01/22/18 1044    Specimen:  Arm from Blood, Intravenous Line Updated:  01/22/18 1540    Narrative:       1 BLUE+1 PURPLE    Blood Culture Aerobic/Anaerobic #1 [[id number]] Collected:  01/22/18 1044    Specimen:  Arm from Blood, Intravenous Line Updated:  01/22/18 1540    Narrative:       1 BLUE+1 PURPLE    Phosphorus [[id number]] Collected:  01/22/18 1010    Specimen:  Blood Updated:  01/22/18 1510     Phosphorus 4.1 mg/dL     Troponin I  [[id number]]  (Abnormal)  Collected:  01/22/18 1339    Specimen:  Blood Updated:  01/22/18 1418     Troponin I 9.53 (HH) ng/mL     Narrative:       When therapeutic range is reached per protocol, change aPTT  frequency to daily at [facility] daily at 0400 until heparin is  discontinued, except for ICU patients - continue q8h aPTTs.    Lactic acid, plasma [[id number]]  (Abnormal) Collected:  01/22/18 1339    Specimen:  Blood Updated:  01/22/18 1404     Lactic acid 4.0 (HH) mmol/L     Glucose Whole Blood - POCT [[id number]]  (Abnormal) Collected:  01/22/18 1333     Updated:  01/22/18 1349     POCT - Glucose Whole blood 215 (H) mg/dL     Glucose Whole Blood - POCT [[id number]]  (Abnormal) Collected:  01/22/18 1230     Updated:  01/22/18 1237     POCT - Glucose Whole blood 323 (H) mg/dL     Glucose Whole Blood - POCT [[id number]]  (Abnormal) Collected:  01/22/18 1125     Updated:  01/22/18 1211     POCT - Glucose Whole blood 393 (H) mg/dL     Lactic acid, plasma [[id number]]  (Abnormal) Collected:  01/22/18 1044    Specimen:  Blood Updated:  01/22/18 1112     Lactic acid 7.1 (HH) mmol/L     B-type Natriuretic Peptide (BNP) [[id number]]  (Abnormal) Collected:  01/22/18 1010    Specimen:  Blood Updated:  01/22/18 1051     B-Natriuretic Peptide 2,590.1 (H) pg/mL     Troponin I [[id number]]  (Abnormal) Collected:  01/22/18 1010    Specimen:  Blood Updated:  01/22/18 1050     Troponin I 9.25 (HH) ng/mL     Comprehensive metabolic panel [[id number]]  (Abnormal) Collected:  01/22/18 1010    Specimen:  Blood Updated:  01/22/18 1037     Glucose 444 (H) mg/dL      BUN 69.4 (H) mg/dL      Creatinine 2.3 (H) mg/dL      Sodium 854 mEq/L      Potassium 5.0 mEq/L      Chloride 101 mEq/L      CO2 20 (L) mEq/L      Calcium 9.6 mg/dL      Protein, Total 7.0 g/dL      Albumin 3.7 g/dL      AST (SGOT) 39 (H) U/L      ALT 22 U/L      Alkaline Phosphatase 147 (H) U/L      Bilirubin, Total 0.5 mg/dL      Globulin 3.3 g/dL      Albumin/Globulin Ratio 1.1     Anion  Gap 16.0 (H)    Magnesium [[id number]]  (Abnormal) Collected:  01/22/18 1010    Specimen:  Blood Updated:  01/22/18 1037     Magnesium 1.1 (L) mg/dL     GFR [[id number]] Collected:  01/22/18 1010     Updated:  01/22/18 1037     EGFR 29.2    PT/APTT [[id number]]  (Abnormal) Collected:  01/22/18 1010     Updated:  01/22/18 1033     PT 15.0 sec      PT INR 1.2 (H)     PT Anticoag. Given Within 48 hrs. None     PTT 27 sec     CBC with differential [[id number]]  (Abnormal) Collected:  01/22/18 1010    Specimen:  Blood  from Blood Updated:  01/22/18 1023     WBC 17.98 (H) x10 3/uL      Hgb 14.3 g/dL      Hematocrit 16.1 %      Platelets 210 x10 3/uL      RBC 4.79 x10 6/uL      MCV 89.6 fL      MCH 29.9 pg      MCHC 33.3 g/dL      RDW 14 %      MPV 13.5 (H) fL      Neutrophils 88.9 %      Lymphocytes Automated 5.5 %      Monocytes 4.7 %      Eosinophils Automated 0.0 %      Basophils Automated 0.1 %      Immature Granulocyte 0.8 %      Nucleated RBC 0.0 /100 WBC      Neutrophils Absolute 16.00 (H) x10 3/uL      Abs Lymph Automated 0.98 x10 3/uL      Abs Mono Automated 0.84 x10 3/uL      Abs Eos Automated 0.00 x10 3/uL      Absolute Baso Automated 0.02 x10 3/uL      Absolute Immature Granulocyte 0.14 (H) x10 3/uL      Absolute NRBC 0.00 x10 3/uL           Procedures performed:      transthoracic echocardiogram  01/23/2018  "Summary    * Left ventricular ejection fraction is severely decreased with an estimated  ejection fraction of 20-25%.    * Left ventricular segmental wall motion is abnormal. Global hypokinesis is  present with akinesis of the mid and distl anterior wall, septum, apex. and  distal inferior wall. .    * Echo density in the apex likely consistent with thrombus.    * Moderate pulmonary hypertension with estimated right ventricular systolic  pressure of  57.71 mmHg.    * -Small pericardial effusion visualized. No evidence of tamponade  physiology. .    * Left pleural effusion."        Treatment Team:   Attending  Provider: Durward Fortes, MD    Disposition:   Disposition:      Condition at Discharge:   fair     Follow up Recommendations for Receiving Provider     1.  CV:  NSTEMI, systolic congestive heart failure, transthoracic echocardiogram indicates ejection fraction 25%, suspected thrombus in apex, Appreciate [place] heart evaluation recommendation. Discussed with cardiologist.  increased to moderate intensity heparin infusion for thrombus. Currently, patient is hemodynamically stable  2. Pulmonary: Congestive heart failure leading to hypoxia, greatly improved with Lasix  3. DM: hemoglobin A1c: 7%, improved control,  continue Lantus and sliding scale insulin. Hold outpatient anti-hypoglycemics while in the hospital.   4. Infectious disease: Cannot rule out underlying pulmonary infection given clinical history. Continuous ceftriaxone.  Since viral panel, await results  5. Renal: acute renal failure, improved to almost normal renal function      Patient stable to transfer to [facility] to continue medical treatment of NSTEMI. Discussed with [facility] triage physician Dr. Dolphus Jenny.  Accepting [facility] physician is Dr. Kae Heller for [facility].   Updated patient regarding transthoracic echocardiogram results.  And answered his questions    Unresulted Labs     Procedure . . . Date/Time    Respiratory Pathogen Panel, PCR (Film Array) [[id number]] Collected:  01/23/18 8119  Updated:  01/23/18 1100    Narrative:       Viral Transport Media (VTM)    MRSA culture (If not done in Triage) [[id number]] Collected:  01/22/18 1627    Specimen:  Body Fluid from Nares Updated:  01/22/18 1917    Blood Culture Aerobic/Anaerobic #2 [[id number]] Collected:  01/22/18 1044    Specimen:  Arm from Blood, Intravenous Line Updated:  01/22/18 1540    Narrative:       1 BLUE+1 PURPLE    Blood Culture Aerobic/Anaerobic #1 [[id number]] Collected:  01/22/18 1044    Specimen:  Arm from Blood,  Intravenous Line Updated:  01/22/18 1540    Narrative:       1 BLUE+1 PURPLE          Discharge Instructions:     Follow-up Information     [facility]. Call.    Why:  To enroll.  Contact information:  [address]  [place] [address]  [phone]                    Discharge Medication List         atorvastatin 80 MG tablet  Dose:  80 mg  Commonly known as:  LIPITOR  Take 80 mg by mouth daily     glipiZIDE 10 MG tablet  Dose:  10 mg  Commonly known as:  GLUCOTROL  Take 10 mg by mouth 2 (two) times daily before meals     lisinopril 5 MG tablet  Dose:  5 mg  Commonly known as:  PRINIVIL,ZESTRIL  Take 5 mg by mouth daily     metFORMIN 1000 MG tablet  Dose:  1000 mg  Commonly known as:  GLUCOPHAGE  Take 1,000 mg by mouth 2 (two) times daily with meals            Signed by: Lawernce Ion, MD

## 2018-01-23 NOTE — UM Notes (Signed)
Hello    REF# [id number]   Admission review for 01/22/18  C/b to Wynona Canes [phone]    Thank-you!        62 yr old presented to ED on 01/22/18 and placed on IP status for NSTEMI    MD Summary:  Assessment:  62 y.o. With 3 days of sob. He had a cough 3 days ago which is better now. 3/10 Cp on left that is worse with exertion. No fevers/chills. He vomited 3 times 2 days ago    VS: t 97.8, hr 115, rr 18, 90/77    Labs: wbc 17.98, glucose 444, bun 47.6, cr 2.3, co2 20, ast 39, alk phos 147, anion gap 16, troponin 9.25, INR 1.2, mag 1.1, bnp 2590.1, lactic acid 7.1,     EKG:   SINUS TACHYCARDIA  LEFT AXIS DEVIATION  RIGHT BUNDLE BRANCH BLOCK  INFERIOR MYOCARDIAL INFARCTION , AGE UNDETERMINED  T WAVE ABNORMALITY, CONSIDER LATERAL ISCHEMIA  ABNORMAL ECG        CXR: Bilateral interstitial/airspace disease.    ED Meds:  Aspirin 234mg  PO  NS bolus  Insulin 8 units IV  Heparin 4000 units IV  Heparin 12 units/kg/hr continuous infusion  Mag sulfate 1g IV X 2  Zosyn 4.5g IV    DX:    Non-STEMI (non-ST elevated myocardial infarction)    Diabetic ketoacidosis without coma associated with other specified diabetes mellitus    Shortness of breath    Acute combined systolic and diastolic congestive heart failure    Acute renal failure, unspecified acute renal failure type    Hypomagnesemia        Cardio consult 01/22/18 :  Assessment:    Acute NSTEMI.  Troponin 9 on presentation with ST depression   Elevated BNP 2000 and clinical signs of CHF   DKA   ARF   DM    Recommendations:    IV heparin and PO aspirin   BB as BP allows   No ACEI/ARB due to low BP   2D echo to check EF   Follow serial troponin.  Will need cath if renal function improves or Nuc if it doesn't when more medically stable.   Treat DKA      MD assessment/plan 01/22/18 :  Assessment:   62 year old male presented with non-ST elevation myocardial infarction, uncontrolled diabetes, acute kidney injury, possible pneumonia versus congestive heart  failure.  The acute renal failure precludes cardiac catheterization at this time, plus the patient is pain-free.  She also has uncontrolled hyperglycemia, possible early ketoacidosis  Lactic acidosis.  He also may have a community acquired pneumonia or aspiration pneumonia.    Plan:   Admit to intensive care unit for treatment of non-STEMI, possible pneumonia, acute kidney injury, uncontrolled diabetes.  We will give IV heparin, plus aspirin, empiric  Antibiotics, IV insulin, infectious diseases workup.  Check echocardiogram.  Cardiology following.  [place] heart.

## 2018-01-23 NOTE — H&P (Signed)
 History & Physical    MRN: [id number]  DATE: 01/23/2018  TIME: 7:54 PM    Chief Complaint: SOB since Friday     Assessment/Plan   62 year old male with HTN, HLD, DM 2, history of TIA 2013 no residual deficit who presented to [facility] for dyspnea since Friday.  Patient was found to have NSTEMI and was in acute heart failure with EF 20-25%, possible apex thrombus,pt also had AKI on CKD, he is transferred for further care     #NSTEMI- cont asa, statin and heparin  drip   -monitor on tele   -trend troponin   -currently no chest pain   -cardiology consulted, npo after midnight     #Acute heart failure   -on echo at [place] ef 20-25% on 01/23/18   -Patient treated with  Lasix  40 mg IV today prior to transfer  -place on  Lasix  20 mg IV daily as currently does not appear overloaded and possible LHC tomorrow   -Monitor monitor I's and O's  -Daily weight  -Cardiology consult    #Possible LV thrombus-currently on heparin  drip  -Cardiology consulted    #Hypoxia-likely due to CHF however does have leukocytosis   -ct chest noncon ordered   -Continue ceftriaxone  started at [facility] until further workup with Ct chest   -Continue nasal cannula oxygen for saturations above 92%  -Diuresis as above    #AKI-last creatinine at [facility] in December 2018 was 1.21  -Creatinine 2.3 on admission to [facility], currently improved to 1.4  -Monitor I's and O's  -Daily BMP    #HTN (hypertension)-lisinopril due to AKI    #HLD (hyperlipidemia)-continue statin    #Diabetes mellitus with hyperglycemia   -Hold metformin due to AK I and intermittent n.p.o. Status  -Placed on sliding scale insulin     #DVT prophylaxis-heparin  drip  Full code      History of Present Illness:   62 year old male with HTN, HLD, DM 2, history of TIA 2013 no residual deficit who presented to [facility] for dyspnea since Friday.  Patient reports symptoms were persistent, worse with activity and lying down.  Had associated nausea and poor appetite.  Reports no associated chest pain, no dizziness, no leg  swelling, no weight gain.   reports just felt terrible this went to [place] on 01/22/2018    Patient's lactate 7.1, troponin 9.25, creatinine 2.3, WBC 17.9 initially at [facility] thus admitted to the ICU.   He was initially treated with IV fluids however received IV Lasix  today.  Troponin peaked at 12.20  BMP today prior to transfer sodium 141, potassium 3.7, chloride 113, bicarb 19, BUN 35, creatinine 1.2, glucose 195  Lactate improved to 2.1 on 01/23/2018 prior to transfer    Echo showed  Summary (see care everywhere for full report)   * Left ventricular ejection fraction is severely decreased with an estimated ejection fraction of 20-25%.  * Left ventricular segmental wall motion is abnormal. Global hypokinesis is present with akinesis of the mid and distl anterior wall, septum, apex. And distal inferior wall. .  * Echo density in the apex likely consistent with thrombus.  * Moderate pulmonary hypertension with estimated right ventricular systolic pressure of57.71 mmHg.  * -Small pericardial effusion visualized. No evidence of tamponade physiology. .  * Left pleural effusion.    Per care everywhere seems patient was initially treated with IV insulin , IV fluids, aspirin , statin, ceftriaxone , Levaquin , Zosyn , heparin  drip and Lasix  today    Currently patient reports feeling better.  He has no chest  pain, no shortness of breath, abdominal pain, no nausea, no vomiting, no focal weakness or other concerns (just reports he is thirsty)    ROS: As noted above. Otherwise, 10 pt ROS reviewed and are negative.    Past Medical History:   Patient Active Problem List   Diagnosis   . NSTEMI (non-ST elevated myocardial infarction) (CMS/HCC)   . Acute systolic heart failure (CMS/HCC)   . HTN (hypertension)   . Personal history of transient ischemic attack (TIA), and cerebral infarction without residual deficits   . HLD (hyperlipidemia)   . Diabetes mellitus with hyperglycemia (CMS/HCC)       Past Surgeries: No past surgical  history    Home Meds :  Prior to Admission medications    Medication Sig Start Date End Date Taking? Authorizing Provider   atorvastatin  (LIPITOR) 80 mg tablet Take 80 mg by mouth 1 (one) time each day  10/13/17   Historical Provider, MD   glipiZIDE (GLUCOTROL) 10 mg tablet Take 10 mg by mouth 2 (two) times a day before meals  10/13/17   Historical Provider, MD   lisinopril (PRINIVIL,ZESTRIL) 5 mg tablet Take 5 mg by mouth 1 (one) time each day  10/13/17   Historical Provider, MD   metFORMIN (GLUCOPHAGE) 1,000 mg tablet Take 1,000 mg by mouth 2 (two) times a day with meals  10/13/17   Historical Provider, MD     Allergies:   No Known Allergies    Family History:   Family History   Problem Relation Age of Onset   . Stroke Neg Hx      Social History  Social History     Socioeconomic History   . Marital status: Unknown     Spouse name: Not on file   . Number of children: Not on file   . Years of education: Not on file   . Highest education level: Not on file   Social Needs   . Financial resource strain: Not on file   . Food insecurity - worry: Not on file   . Food insecurity - inability: Not on file   . Transportation needs - medical: Not on file   . Transportation needs - non-medical: Not on file   Occupational History   . Not on file   Tobacco Use   . Smoking status: Current Every Day Smoker   . Smokeless tobacco: Current User   . Tobacco comment: ecigarettes since 2012, was ppd smoker prior    Substance and Sexual Activity   . Alcohol use: Not Currently   . Drug use: Not Currently   . Sexual activity: Not on file   Other Topics Concern   . Not on file   Social History Narrative   . Not on file       Vitals: BP 115/77 (BP Location: Right arm, Patient Position: Lying)   Pulse 98   Temp 36.4 C (97.6 F) (Oral)   Resp 18   Ht 1.803 m (5' 11)   Wt 74.3 kg (163 lb 12.8 oz)   SpO2 95%   BMI 22.85 kg/m     Physical Exam  Gen:  Nad, sitting in bed  Eyes: EOMI. No scleral icterus.   ZWU:Nmneyjmbwk moist.   NECK:  soft, supple  CV:  Regular rate and rhythm. No murmurs, rubs, or gallops.   PULM:  Nonlabored, rales at bases  GI:  Soft. Bowel sounds present. Nontender to palpation.  MSK:No pedal edema. Normal tone   NEURO:  CN 2-12 grossly intact, strength 5/5 upper and lower   Psych: aaox3, normal speech   Skin: perfused but ext slight cold, no cyanosis     Laboratory:    Lab Results   Component Value Date    WBC 12.7 (H) 01/23/2018    HGB 12.6 (L) 01/23/2018    HCT 38.1 (L) 01/23/2018    MCV 88.4 01/23/2018    PLT 172 01/23/2018       Recent Results (from the past 48 hour(s))   POCT Glucose Meter    Collection Time: 01/23/18  3:36 PM   Result Value Ref Range    Glucose 213 (H) 74 - 106 mg/dL   Basic metabolic panel    Collection Time: 01/23/18  4:05 PM   Result Value Ref Range    Sodium 138 137 - 145 mmol/L    Potassium 4.7 3.5 - 5.1 mmol/L    Chloride 104 98 - 106 mmol/L    CO2 23 23 - 30 mmol/L    BUN 41 (H) 9 - 20 mg/dL    Creatinine 1.4 (H) 0.7 - 1.3 mg/dL    eGFR 55 fO/fpw/8.26f*7    Glucose 224 (H) 74 - 99 mg/dL    Calcium  8.7 8.4 - 10.2 mg/dL    eGFR comment       Calculation may be unreliable for the elderly (>70 years), or persons with extreme body size, muscle mass, or nutritional status. If race was not provided: multiply the estimated GFR by 1.21 for African American patients.      Troponin I    Collection Time: 01/23/18  5:05 PM   Result Value Ref Range    Troponin I 10.100 (HH) <0.012 - 0.034 ng/mL ng/mL   aPTT    Collection Time: 01/23/18  5:05 PM   Result Value Ref Range    aPTT 46 (H) 25 - 40 sec   PT with INR STAT    Collection Time: 01/23/18  5:05 PM   Result Value Ref Range    Protime 14.7 (H) 9.3 - 12.8 sec    INR 1.26     CBC with Differential    Collection Time: 01/23/18  5:05 PM   Result Value Ref Range    Auto WBC 12.7 (H) 3.6 - 9.6 10*3/L      RBC 4.31 (L) 4.35 - 5.67 10*6/L    Hemoglobin 12.6 (L) 13.1 - 17.1 g/dL    Hematocrit 61.8 (L) 39.5 - 50.3 %    MCV 88.4 80.0 - 100.0 fL    MCH 29.2 27.7 -  33.1 pg    MCHC 33.1 31.7 - 35.7 g/dL    RDW 86.5 89.9 - 84.9 %    Platelets 172 150 - 440 10*3/uL    MPV 12.6 (H) 9.4 - 12.4 fL    nRBC 0 %    Neutrophils Relative 84.3 %    Lymphocytes Relative 8.9 %    Monocytes Relative 6.1 %    Eosinophils Relative 0.1 %    Basophils Relative 0.2 %    IG% 0.40 0 %    Neutrophils Absolute 10.67 (H) 1.40 - 7.70 10*3/uL    Lymphocytes Absolute 1.12 0.50 - 4.70 10*3/uL    Monocytes Absolute 0.77 0.00 - 1.40 10*3/uL    Eosinophils Absolute 0.01 0.00 - 0.70 10*3/uL    Basophils Absolute 0.03 0.00 - 0.20 10*3/uL    Immature Granulocytes Absolute 0.05 0.00 - 0.31 10*3/uL   Hepatic function panel  Collection Time: 01/23/18  5:05 PM   Result Value Ref Range    Albumin 3.2 (L) 3.5 - 5.0 g/dL    Alkaline Phosphatase 106 38 - 126 U/L    ALT (SGPT) 25 21 - 72 U/L    AST 38 17 - 59 u/L    Bilirubin, Direct <0.1 0.0 - 0.4 mg/dL    Bilirubin, Indirect 0.50 0.00 - 0.75 mg/dL    Total Bilirubin 9.49 0.20 - 1.30 mg/dL    Total Protein 6.0 (L) 6.4 - 8.2 g/dL   POCT Glucose Meter    Collection Time: 01/23/18  6:16 PM   Result Value Ref Range    Glucose 202 (H) 74 - 106 mg/dL       Lab Results   Component Value Date    ALT 25 01/23/2018    AST 38 01/23/2018    ALKPHOS 106 01/23/2018    BILITOT 0.50 01/23/2018       Lab Results   Component Value Date    INR 1.26 01/23/2018       Diagnostic Results:   CXR at , impression(see full report in care everywhere)  1. Findings suggest developing congestive failure when compared to the  prior study done 01/22/2018. There is superimposed atelectasis on this  limited inspiratory study.  2. Increased opacity in the right medial lung base. This could represent  focal edema with atelectasis. Follow-up to exclude developing infiltrate  in this area recommended.    I've discussed the case and plan of care with the patient and cardiology       Scarlett GORMAN Blanch, MD

## 2018-01-23 NOTE — Discharge Instructions (Signed)
Recognizing a Heart Attack or Angina    If you have risk factors for heart problems, you should always watch for signs of angina or a heart attack. If you have a sudden heart problem, getting treatment right away could save your life. Risk factors for a heart attack include advanced age, high cholesterol, high blood pressure, having a family member who had a heart attack before the age of 50, diabetes, smoking, and stress. There are other risk factors, including eating a high-fat diet and not getting much exercise.  Understanding angina and heart attack   Anginais a painful burning, tightness, or pressure in the chest, back, neck, throat, or jaw. It means not enough blood is getting to the heart. This is usually from a blocked artery in the heart. Angina is a signthat you may be having, or are about to have, aheart attack. You need to seea healthcare provider right away.   A heart attack is also known as acute myocardial infarction (AMI). It is what happens when blood can't get to part of the heartmuscle. That part of the heart muscle is damaged and starts to die. If enough of the heart is affected, it will severely limit its ability to send blood to the rest of the body. It may cause death. It is vital to get help as soon as possible for a heart attack.  Stable angina versus unstable angina  Stable angina isalso known as chronic angina. It has a typical pattern. It happens when you exert yourself physically or feel a strong emotion. Nitroglycerin, rest, or both, will easily relieve stable angina symptoms. Stable angina symptoms will most likely feel the sameeach time you have them. It is important to discuss these symptoms with your healthcare provider. They can be a warning sign of a future heart attack.  Unstable angina causes unexpected or unpredictable symptoms, commonly when you are at rest. Unstable angina is a medical emergency. Angina is also considered unstable if resting and nitroglycerin don't  ease symptoms. It is also unstable if symptoms are getting worse, happening more often, orlasting longer. These symptoms may mean you havea severe blockage or a spasm of a heart artery. Unstable angina is commonly a sign of an active heart attack. Remember the following tips:   Stable angina symptoms should go away with rest or medicine. If they don't go away, call 911!   Stable angina symptoms last for only a few minutes. If they last longer than that, or if they go away and come back, you may be having a heart attack. Call 911!   If you have shortness of breath, cold sweat, nausea, or lightheadedness, call 911!  Forangina that shows up for the first time, there is only one response: ?Call 911! You should never diagnose angina by yourself. If these symptoms are new, or worse than usual, call 911!  Warning signs of a heart attack  If you have symptoms that you can't explain, call 911right away. Don't drive yourself to the emergency room. The following are warning signs of a possible heart attack:   Chest discomfort.Most heart attacks involve discomfort in the center of the chest that lasts more than a few minutes, or that goes away and comes back. It can feel like uncomfortable pressure, squeezing, fullness, or pain.   Discomfort in other areas of the upper body.Symptoms can include pain or discomfort in one or both arms, the back, neck, jaw, or stomach.   Shortness of breathwith or without chest discomfort     Other signsmay include breaking out in a cold sweat, nausea, or lightheadedness.  If you think someone is having a heart attack, call 911 instead of driving the person to the ER. The 911 dispatcher may tell you to give the person aspirin while waiting for help to arrive. If the dispatcher doesn't tell you to do this, don't give the person aspirin. Aspirin can be dangerous under certain circumstances.   Note for women. Like men, women commonly have chest pain or discomfort as a heart attack  symptom. But women are somewhat more likely than men to have other common symptoms, such as shortness of breath, nausea and vomiting, back pain, or jaw pain.   Note for older adults. Older people may also not have typical symptoms of a heart attack. They may have symptoms thatinclude fainting, weakness, or confusion. Ignoring these symptoms can lead to critical illness or death. You should get your symptoms checked out right away.   If you've had a heart attack. People who have had one heart attack are at risk for having another. Your provider may prescribe medicine such as nitroglycerin to take when chest pain starts. Or you may need medicines to lower your heart rate and blood pressure to prevent angina and another heart attack. Remember to take any medicines your provider has given. Don't not stop them without speaking with your provider first.    If you have diabetes: silent heart problems  Over time, high blood sugar can damage nerves in your body. This may keep you from feeling pain caused by a heart problem, leading to a "silent" heart problem. If you don't feel symptoms, you are less able to recognize that you may be having a heart attack and get treatment right away. Talk to your healthcare provider about how to lower your risk for silent heart problems.   Date Last Reviewed: 03/18/2015   2000-2018 [employer] [address], [place] [address]. All rights reserved. This information is not intended as a substitute for professional medical care. Always follow your healthcare professional's instructions.

## 2018-01-23 NOTE — Nursing Note (Signed)
 Pt admitted to room 645 transferred from [facility] Per report from RN at [facility] pt was started on Heparin  gtt yesterday but was just changed to there hospital moderate intensity heparin  gtt protocol and Heparin  infusing at 13units/kg/hr (20.45ml/hr, 1032units/hr), they had pt's weight as 79.4kg, Our IV pumps do not have low/moderate/high heparin  scales therefore when IV tubing changed to our pump rate put at 1000units/hr 56ml/hr (rate checked w/ Delon RN), Dr. Tobie aware and stated okay to run at that rate for now and she is ordering labs now and will have to contact pharmacist to calculate rate change b/c Dr. Tobie wants pt to be on high dose ACS heparin  gtt protocol. Pt denies CP/SOB at this time.  Pt oriented to unit, call bell, and falls percautions. Call bell w/in reach, 3 side rails up, bed in low position, and bed alarm on.     Vitals:   Vitals:    01/23/18 1546   BP: 115/77   Pulse: 98   Resp: 18   Temp: 36.4 C (97.6 F)   SpO2: 95%

## 2018-01-23 NOTE — Nursing Note (Signed)
 Aptt that was drawn at 1700 =46. RN called and spoke w/ Dr. Tobie and notified her that rate was increased to 1100units/hr at 1700 per protocol orders entered at that time when aptt was drawn and RN wanted to clarify if protocol orders should be followed since rate was already increased at that time. Dr. Tobie stated to reorder an Aptt to be drawn 6 hours from rate change and then follow the protocol orders at that time. Will order next aptt at 2300.

## 2018-01-24 NOTE — Unmapped External Note (Signed)
 Goals:      Problem: Safety Adult - Fall  Goal: Free from fall injury  Outcome: Progressing  Flowsheets (Taken 01/24/2018 0349)  Free from fall injury: Assess patient frequently for physical needs;Institute fall precautions as indicated by assessment;Identify cognitive and physical deficits and behaviors that affect risk of falls;Educate patient/family on ambulation safety, including physical limitations;Instruct patient to call for assistance with activity;Modify environment to reduce risk of injury;Develop rounding plan;Develop toileting plan;Utilize bed or body alarms if needed         Possible barriers to meeting goals or advancing the plan of care:        Comments on overall evaluation of advancement of plan of care:  Pt A&O x4, on O2 1L sating >92. Pt on heparin  @ 1200units/hr next aPTT to be drawn at 0600. Pt has been NPO since midnight. Plan of care was discussed. Safety precaution and rounding initiated. Bed at lowest position, call bell within reach and bed alarm on.

## 2018-01-26 NOTE — Unmapped External Note (Signed)
 Surgery Op Note    Patient:   Operative Report    Surgeon(s): Norleen LELON Senior, MD  Phone Number: [phone]  Surgeon(s):  Norleen LELON Senior, MD    Staff: Circulator: Suzen JAYSON Regan, RN; Carlyon JAYSON Guan, RN  Relief Scrub: Rosaline Nipper, RN  Scrub Person: Franky Hives  Second Scrub: Debby JINNY Hammersmith, RN  First Assistant: Rollene VEAR Croak, [place]; Asberry Ford    Anesthesiologist: Zulema PinaTHEORA Flatten, MD    Pre-Op Diagnosis: NSTEMI (non-ST elevated myocardial infarction) (CMS/HCC) [I21.4]    Post-Op Diagnosis: Post-op Diagnosis     * NSTEMI (non-ST elevated myocardial infarction) (CMS/HCC) [I21.4]    Procedure:   * CORONARY ARTERY BYPASS GRAFT x 4 with left leg endoscopic vein harvest. , DRAINAGE OF large LEFT AND RIGHT PLEURAL EFFUSIONs    Anesthesia: General    Findings:  LIMA to LAD 1.5 moderate  Ao to OM1 1.5 moderate  Ao to RCA 1.5 severe     Vein satisfactory.  LIMA good.   LV function. Ef 25%, global hypokinesis. No LV thrombus identified on TEE. RV function good.  Left pleural effusion 1500 cc. Right pleural effusion 1750 cc.  Wean from CPB w moderate inotropic support.       Estimated Blood Loss: 500 mL    Complications: None; patient tolerated procedure well.    Drains:   Urethral Catheter Foley 2-way (Active)       Y Chest Tube 1 and 2 1 Mediastinal 19 Fr. 2 Right Pleural 19 Fr. (Active)       Y Chest Tube 1 and 2 1 Left Pleural 19 Fr. (Active)       Temporary Pacing Wires: Atrial and Ventricular    Implants: Nothing was implanted during the procedure    Procedure:  The patient was placed supine on the operating table and general anesthesia induced. Prep and drape of the chest, abdomen, and legs were done. A suitable segment of saphenous vein was harvested by endovascular techniques from the left leg and the leg closed as usual.    A primary sternotomy was made and the heart exposed. The left internal mammary artery was dissected out and prepared. Systemic heparin  was administered and arterial and  venous cannulation were effected by standard techniques. Systemic normothermia was maintained. Cardiopulmonary bypass was instituted. The aorta was cross clamped and cardioplegic solution infused by standard techniques. Distal anastomoses for each artery were constructed first followed by the construction of the proximal anastomoses for each vein graft. Cardioplegia was reinfused following each proximal anastomosis. The internal mammary artery was anastomosed end to side to the LAD. Following warm cardioplegia, the cross clamp was removed and cardiopulmonary bypass was discontinued after a period of controlled reperfusion. Decannulation was done and heparin  reversed with protamine. Hemostasis was obtained. Temporary pacing wires were placed. A single mediastinal drainage tube was brought out inferiorly. The sternum was closed with interrupted wire and the subcutaneous tissue and skin closed with absorbable suture.  Sponge and needle counts were reported as correct.    Pump Time: 96 minutes    Clamp Time: 86 minutes    Condition: Stable    Disposition: CVICU: intubated and hemodynamically stable

## 2018-03-09 NOTE — Unmapped External Note (Signed)
 Operative Note    Surgeon(s): Norleen LELON Senior, MD  Phone Number: [phone]  Surgeon(s):  Norleen LELON Senior, MD    Staff: Circulator: Rosaline Nipper, RN  Scrub Person: Barnie JAYSON Rouse  RNFA: Devere Malm, RN    Anesthesiologist: Zulema PinaTHEORA Flatten, MD    Pre-Op Diagnosis: Pericardial effusion     Post-Op Diagnosis: Post-op Diagnosis     * Pericardial effusion     Procedure:   * PERICARDIAL WINDOW SUBXIPHOID with biopsy.    Anesthesia: General    Findings:  Moderate serosanguinous pericardial effusion. TEE done, good LV function. Complete drainage of fluid    Anesthesia: General    Estimated Blood Loss: 10 cc    Specimens:                Order Name Source Comment Collection Info Order Time   ANAEROBIC CULTURE Pericardium  Collected By: Norleen LELON Senior, MD 03/09/2018 10:11 AM   FUNGAL CULTURE Pericardium  Collected By: Norleen LELON Senior, MD 03/09/2018 10:11 AM   TISSUE CULTURE Pericardium  Collected By: Norleen LELON Senior, MD 03/09/2018 10:11 AM   AFB CULTURE Pericardium  Collected By: Norleen LELON Senior, MD 03/09/2018 10:11 AM   ANAEROBIC CULTURE Pericardial Fluid  Collected By: Norleen LELON Senior, MD 03/09/2018 10:12 AM   FUNGAL CULTURE Pericardial Fluid  Collected By: Norleen LELON Senior, MD 03/09/2018 10:12 AM   FLUID CULTURE Pericardial Fluid  Collected By: Norleen LELON Senior, MD 03/09/2018 10:12 AM   AFB CULTURE Pericardial Fluid  Collected By: Norleen LELON Senior, MD 03/09/2018 10:12 AM   TISSUE EXAM Pericardium  Collected By: Norleen LELON Senior, MD 03/09/2018 10:11 AM   NON-GYNECOLOGIC CYTOLOGY Pericardial Fluid  Collected By: Norleen LELON Senior, MD 03/09/2018 10:12 AM       Complications: None; patient tolerated the procedure well.    Drains:   Chest Tube 1 Anterior Mediastinal (Active)       Procedure:  The patient was placed supine on the operating table and general anesthesia induced. Prep and drape of the chest was done.     A subxyphoid incision was made, the abdominal cavity entered, and the diaphragm/pericardium identified and opened. Pericardial fluid was drained. A window of  pericardium was removed and sent for biopsy and culture. A Drain was placed into the pericardial space and brought out through a separate stab wound.. The wound was closed in layers with absorbable suture. Sponge and needle counts were reported as correct. The patient was sent to the CV recovery area in stable condition.    Condition: stable    Disposition: PACU: hemodynamically stable

## 2018-03-09 NOTE — Progress Notes (Signed)
 Cardiothoracic and Vascular Surgery Progress Note    Procedure: s/p subxyphoid pericardial window  Findings:  Moderate serosanguinous pericardial effusion. TEE done, good LV function. Complete drainage of fluid      POD # 0    Assessment/Plan   Patient Active Problem List   Diagnosis   . NSTEMI (non-ST elevated myocardial infarction) (CMS/HCC)   . Acute systolic heart failure (CMS/HCC)   . HTN (hypertension)   . Personal history of transient ischemic attack (TIA), and cerebral infarction without residual deficits   . HLD (hyperlipidemia)   . Diabetes mellitus with hyperglycemia (CMS/HCC)   . Orthostatic hypotension   . Pericardial effusion without cardiac tamponade     Plan per CVT:  Pericardial effusion s/p subxyphoid pericardial window  Continue JP to bulb suction. Monitor drainage with VS.  Resume aspirin .  Follow up am CXR and CBC.  Ultram for pain as needed.  Resume pre-op meds as per cardiology/primary team.  Pulmonary toilet.  DVT proph-SCD's.  GI proph-Protonix    HLD  Resume statin.    DM  Continue accu checks with sliding scale insulin .    Orthostatic hypotension  Management per cardiology  NS 75 ml/hour given while in rapid recovery and discontinued upon transfer to the floor.  VSS lying in bed.          Subjective   No complaints.    Diagnostic Results:   I have personally reviewed labs and imaging    Pertinent Labs:  Results from last 7 days   Lab Units 03/09/18  1044 03/09/18  1035 03/09/18  0706   WBC AUTO 10*3/L   5.2  --  6.2   HEMOGLOBIN g/dL 9.4*  --  9.4*   HEMATOCRIT % 27.8*  --  28.8*   PLATELETS AUTO 10*3/uL 176  --  208   SODIUM mmol/L  --   --  137   POTASSIUM mmol/L  --   --  5.0   CHLORIDE mmol/L  --   --  106   CO2 mmol/L  --   --  25   BUN mg/dL  --   --  17   CREATININE mg/dL  --   --  1.2   POCT GLUCOSE BLD mg/dL  --  833*  --    GLUCOSE mg/dL  --   --  859*   CALCIUM  mg/dL  --   --  8.9       Review of Systems   All other systems reviewed and are negative.      Objective      Telemetry: SR    Vitals: BP (!) 60/44 (BP Location: Left arm, Patient Position: Sitting)   Pulse 92   Temp 36.5 C (97.7 F) (Oral)   Resp 16   Ht 1.778 m (5' 10)   Wt 68.8 kg (151 lb 10.8 oz)   SpO2 97%   BMI 21.76 kg/m     Physical Exam   Constitutional: He is oriented to person, place, and time. He appears well-developed and well-nourished. No distress.   HENT:   Head: Normocephalic.   Eyes: Pupils are equal, round, and reactive to light.   Neck: Neck supple.   Cardiovascular: Normal rate and regular rhythm.   Murmur (2/6 SEM) heard.  JP bulb drainage serosanguinous.   Pulmonary/Chest: Effort normal and breath sounds normal.   Abdominal: Soft.   Hypoactive BS, No N/V   Genitourinary:   Genitourinary Comments: No  Void yet post op  Musculoskeletal: Normal range of motion. He exhibits no edema.   Neurological: He is alert and oriented to person, place, and time. No cranial nerve deficit or sensory deficit.   Skin: Skin is warm and dry. Capillary refill takes 2 to 3 seconds. He is not diaphoretic. There is pallor.   Old sternotomy incision, L SVG incision healing.  Subxyphoid dressing dry and intact.   Psychiatric: He has a normal mood and affect.          Lines/tubes/drains:  PIV, arterial line, JP bulb drain    I/O:    Intake/Output Summary (Last 24 hours) at 03/09/2018 1142  Last data filed at 03/09/2018 1007  Gross per 24 hour   Intake 1000 ml   Output 1675 ml   Net -675 ml

## 2018-03-10 NOTE — Progress Notes (Signed)
 Cardiothoracic and Vascular Surgery Progress Note    Procedure: s/p subxiphoid pericardial window  Findings: Moderate serosanguinous pericardial effusion. TEE done, good LV function. Complete drainage of fluid    POD # 1    Assessment/Plan:  JP drain removed at 1630 without complications, patient tolerated well.   Continue aspirin   Tylenol , Ultram or percocet for pain as needed.  Pulmonary toilet, IS.  DVT proph-SCD's  Follow up path and cultures    Remainder of care per hospitalist and cardiology    Call with any concerns [phone]    Subjective   Minimal pain.  Denies dizziness, lightheadedness.  No further nausea since this morning.    Diagnostic Results:   I have personally reviewed labs and imaging    Review of Systems   All other systems reviewed and are negative.    Objective     Physical Exam   Constitutional: He is oriented to person, place, and time. He appears well-developed and well-nourished. He is cooperative. No distress.   HENT:   Head: Normocephalic.   Eyes: Pupils are equal, round, and reactive to light.   Neck: Neck supple.   Cardiovascular: Normal rate and regular rhythm.   Murmur (2/6 SEM) heard.  Pulses:       Radial pulses are 2+ on the right side, and 2+ on the left side.        Dorsalis pedis pulses are 2+ on the right side, and 2+ on the left side.   Pericardial drain with tiny amount old dried bloody drainage   Pulmonary/Chest: Effort normal. No accessory muscle usage. No tachypnea. No respiratory distress. He has decreased breath sounds in the right lower field and the left lower field.   Abdominal: Soft. Bowel sounds are normal.   Musculoskeletal: Normal range of motion. He exhibits no edema.   Neurological: He is alert and oriented to person, place, and time. No cranial nerve deficit or sensory deficit.   Skin: Skin is warm and dry. Capillary refill takes less than 2 seconds. He is not diaphoretic. There is pallor.        Psychiatric: He has a normal mood and affect.         Lines/tubes/drains:  PIV, JP bulb drain    I/O:    Intake/Output Summary (Last 24 hours) at 03/10/2018 1656  Last data filed at 03/10/2018 0930  Gross per 24 hour   Intake 440 ml   Output 1040 ml   Net -600 ml

## 2018-03-11 NOTE — Unmapped External Note (Addendum)
 Goals:   Clinical Goals for the Shift: monitor orthostatics, monitor BP, safety    Pt A/Ox3, VSS, scheduled meds given.  Pt self turns, POC reviewed with pt, safety and fall precautions in place, bed alarm on, call bell within reach, hourly rounding, will continue to monitor.                Comments on overall evaluation of advancement of plan of care:    Problem: Risk for Venous Thrombosis Embolism  Goal: Absence of Venous Thrombosis Embolism  Outcome: Progressing     Problem: Risk for Nosocomial Infection  Goal: Patient will remain free of nosocomial Infections  Outcome: Progressing     Problem: Safety Adult - Fall  Goal: Free from fall injury  Outcome: Progressing     Problem: Discharge Planning  Goal: Discharge to home or other facility with appropriate resources  Outcome: Progressing     Problem: SKIN CARE KNOWLEDGE DEFICIT  Goal: Patient/caregiver demonstrate knowledge of skin care management  Outcome: Progressing     Problem: Potential for Compromised Skin Integrity  Goal: Skin Integrity is Maintained or Improved  Outcome: Progressing     Problem: Urinary Incontinence  Goal: Perineal skin integrity is maintained or improved  Outcome: Progressing

## 2018-03-13 NOTE — Progress Notes (Signed)
 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 Blood pressure medications were held and patient was started on midodrine  yesterday.  Reviewed echocardiogram images and there was no evidence of tamponade, or constrictive physiology on exam.     Vitals:    03/13/18 1237   BP: 142/82   Pulse: 92   Resp: 20   Temp: 36.3 C (97.3 F)   SpO2: 97%     No JVD  RRR , nl S1, S2, no MRG  LCTA   No edema    A/P 62 yo male s/p CABG. At this time unclear why he remains orthostatic (possibly due to diabetic neuropathy) but may be improving. No evidence of CHF or constrictive physiology so unclear what benefit right heart catheterization would have other than assisting with volume status. Would recommend encouraging PO fluid intake as well as assessing why patient is having bladder urinary retention which may be causing vagally mediated responses as well.

## 2018-08-13 ENCOUNTER — Emergency Department: Payer: PRIVATE HEALTH INSURANCE

## 2018-08-13 ENCOUNTER — Inpatient Hospital Stay
Admission: EM | Admit: 2018-08-13 | Discharge: 2018-08-14 | DRG: 638 | Disposition: A | Discharge status: Home / Self Care | Payer: PRIVATE HEALTH INSURANCE | Attending: Critical Care Medicine | Admitting: Critical Care Medicine

## 2018-08-13 DIAGNOSIS — E7800 Pure hypercholesterolemia, unspecified: Secondary | ICD-10-CM | POA: Diagnosis present

## 2018-08-13 DIAGNOSIS — E860 Dehydration: Secondary | ICD-10-CM

## 2018-08-13 DIAGNOSIS — I10 Essential (primary) hypertension: Secondary | ICD-10-CM | POA: Diagnosis present

## 2018-08-13 DIAGNOSIS — I252 Old myocardial infarction: Secondary | ICD-10-CM

## 2018-08-13 DIAGNOSIS — I2510 Atherosclerotic heart disease of native coronary artery without angina pectoris: Secondary | ICD-10-CM | POA: Diagnosis present

## 2018-08-13 DIAGNOSIS — D72828 Other elevated white blood cell count: Secondary | ICD-10-CM

## 2018-08-13 DIAGNOSIS — E1110 Type 2 diabetes mellitus with ketoacidosis without coma: Principal | ICD-10-CM | POA: Diagnosis present

## 2018-08-13 DIAGNOSIS — E872 Acidosis, unspecified: Secondary | ICD-10-CM

## 2018-08-13 DIAGNOSIS — I959 Hypotension, unspecified: Secondary | ICD-10-CM

## 2018-08-13 DIAGNOSIS — Z87891 Personal history of nicotine dependence: Secondary | ICD-10-CM

## 2018-08-13 DIAGNOSIS — Z8673 Personal history of transient ischemic attack (TIA), and cerebral infarction without residual deficits: Secondary | ICD-10-CM

## 2018-08-13 DIAGNOSIS — E1310 Other specified diabetes mellitus with ketoacidosis without coma: Secondary | ICD-10-CM

## 2018-08-13 DIAGNOSIS — Z951 Presence of aortocoronary bypass graft: Secondary | ICD-10-CM

## 2018-08-13 DIAGNOSIS — N179 Acute kidney failure, unspecified: Secondary | ICD-10-CM | POA: Diagnosis present

## 2018-08-13 DIAGNOSIS — N390 Urinary tract infection, site not specified: Secondary | ICD-10-CM | POA: Diagnosis present

## 2018-08-13 DIAGNOSIS — I255 Ischemic cardiomyopathy: Secondary | ICD-10-CM | POA: Diagnosis present

## 2018-08-13 LAB — GFR: EGFR: 15.8

## 2018-08-13 LAB — GLUCOSE WHOLE BLOOD - POCT: Whole Blood Glucose POCT: >600 mg/dL (ref 70–100)

## 2018-08-13 LAB — LACTIC ACID, PLASMA: Lactic Acid: 6.2 mmol/L (ref 0.2–2.0)

## 2018-08-13 MED ORDER — INSULIN REGULAR HUMAN 100 UNIT/ML IJ SOLN
0.10 [IU]/kg/h | INTRAMUSCULAR | Status: DC
Start: 2018-08-13 — End: 2018-08-14
  Administered 2018-08-14: 01:00:00 6.5 [IU]/h via INTRAVENOUS
  Filled 2018-08-13: qty 1

## 2018-08-13 MED ORDER — POTASSIUM CHLORIDE IN NACL 20-0.9 MEQ/L-% IV SOLN
150.00 mL/h | INTRAVENOUS | Status: DC
Start: 2018-08-13 — End: 2018-08-14
  Administered 2018-08-14: 150 mL/h via INTRAVENOUS

## 2018-08-13 MED ORDER — SODIUM CHLORIDE 0.9 % IV BOLUS
30.00 mL/kg | Freq: Once | INTRAVENOUS | Status: AC
Start: 2018-08-13 — End: 2018-08-14
  Administered 2018-08-13: 23:00:00 1950 mL via INTRAVENOUS

## 2018-08-13 NOTE — ED Triage Notes (Signed)
Pt arrives to ED for weakness and anorexia x3 days. Pt has hx of open heart and stroke

## 2018-08-13 NOTE — ED Provider Notes (Signed)
Physician/Midlevel provider first contact with patient: 08/13/18 2224         History     Chief Complaint   Patient presents with   . Generalized weakness     Patient presents with nausea vomiting diarrhea since Friday.  Symptoms were slow onset, initially mild no moderate, continues to be mild to moderate, constant, nothing makes it better or worse, no associated pain.  Patient has not been eating or drinking much fluid over the past few days but taking medications intermittently only.  Denies f/c/c/chest pain/sob/headahce/photophobia/rash/focal neuro deficits/loc/neck pain/bloody mucus and diarrhea/blood or bile in emesis.  Patient supposedly fell yesterday and hit his head.    The history is provided by the patient, the spouse and medical records. No language interpreter was used.            Past Medical History:   Diagnosis Date   . Diabetes mellitus    . Elevated cholesterol    . Hypertension    . Stroke        History reviewed. No pertinent surgical history.    History reviewed. No pertinent family history.    Social  Social History     Tobacco Use   . Smoking status: Former Smoker     Years: 30.00     Last attempt to quit: 10/17/2002     Years since quitting: 15.8   . Smokeless tobacco: Never Used   Substance Use Topics   . Alcohol use: Yes   . Drug use: No       .     No Known Allergies    Home Medications     Med List Status:  Unable to Assess Set By: Donaciano Eva, RN at 08/13/2018 10:31 PM        No Medications           Review of Systems   Constitutional: Negative for chills and fever.   HENT: Negative for rhinorrhea and sore throat.    Eyes: Negative for photophobia and discharge.   Respiratory: Negative for cough and shortness of breath.    Cardiovascular: Negative for chest pain and palpitations.   Gastrointestinal: Positive for diarrhea, nausea and vomiting. Negative for abdominal pain.   Genitourinary: Negative for dysuria, frequency and hematuria.   Musculoskeletal: Negative for back pain, myalgias  and neck pain.   Neurological: Negative for dizziness, syncope, weakness, light-headedness, numbness and headaches.   Psychiatric/Behavioral: Negative for confusion and suicidal ideas.       Physical Exam    BP: (!) 88/59, Heart Rate: 84, Resp Rate: (!) 30, SpO2: 93 %, Weight: 65 kg    Physical Exam  Vitals signs and nursing note reviewed.   Constitutional:       General: He is not in acute distress.     Appearance: He is well-developed. He is not toxic-appearing or diaphoretic.   HENT:      Head: Normocephalic and atraumatic.      Mouth/Throat:      Lips: Pink.      Mouth: Mucous membranes are dry. No injury, lacerations, oral lesions or angioedema.      Tongue: No lesions.      Palate: No mass and lesions. Palate does not elevate in midline.      Pharynx: Oropharynx is clear. Uvula midline. No pharyngeal swelling, oropharyngeal exudate, posterior oropharyngeal erythema or uvula swelling.      Tonsils: No tonsillar exudate or tonsillar abscesses.   Eyes:  General: Lids are normal.         Right eye: No discharge.         Left eye: No discharge.      Conjunctiva/sclera: Conjunctivae normal.      Right eye: Right conjunctiva is not injected. No exudate.     Left eye: Left conjunctiva is not injected. No exudate.     Pupils: Pupils are equal, round, and reactive to light.   Neck:      Musculoskeletal: Full passive range of motion without pain, normal range of motion and neck supple. Normal range of motion. No spinous process tenderness or muscular tenderness.      Vascular: No carotid bruit or JVD.      Trachea: No tracheal tenderness.   Cardiovascular:      Rate and Rhythm: Normal rate and regular rhythm.      Pulses: Normal pulses.      Heart sounds: Normal heart sounds. No murmur. No friction rub. No gallop.       Comments: Hypotension  Pulmonary:      Effort: Pulmonary effort is normal. No respiratory distress.      Breath sounds: Normal breath sounds. No stridor. No decreased breath sounds, wheezing, rhonchi  or rales.   Abdominal:      General: Bowel sounds are normal. There is no distension.      Palpations: Abdomen is soft. There is no mass.      Tenderness: There is no tenderness. There is no guarding or rebound.   Musculoskeletal: Normal range of motion.         General: No tenderness.      Comments: No calf tenderness, no Homman's sign   Skin:     General: Skin is warm.      Coloration: Skin is not pale.      Findings: No rash.   Neurological:      Mental Status: He is alert and oriented to person, place, and time.      GCS: GCS eye subscore is 4. GCS verbal subscore is 5. GCS motor subscore is 6.      Cranial Nerves: No cranial nerve deficit.      Sensory: No sensory deficit.      Coordination: Coordination normal.      Deep Tendon Reflexes: Reflexes are normal and symmetric.      Reflex Scores:       Patellar reflexes are 2+ on the right side and 2+ on the left side.  Psychiatric:         Speech: Speech normal.         Behavior: Behavior normal.         Thought Content: Thought content normal.         Judgment: Judgment normal.           MDM and ED Course     ED Medication Orders (From admission, onward)    Start Ordered     Status Ordering Provider    08/13/18 2235 08/13/18 2234  sodium chloride 0.9 % bolus 1,950 mL  Once     Route: Intravenous  Ordered Dose: 30 mL/kg     Ordered Rohn Fritsch R             MDM  Number of Diagnoses or Management Options  Acute kidney injury:   Dehydration:   Diabetic ketoacidosis without coma associated with other specified diabetes mellitus:   Hypotension, unspecified hypotension type:   Metabolic acidosis:  Other elevated white blood cell (WBC) count:   Diagnosis management comments: I, Renella Cunas Marillyn Goren, have assumed care of Vijendra [employer].  I have completed his evaluation, reviewed all pertinent data, and determined his final disposition.    " *This note was generated by the Epic EMR system/ Dragon speech recognition and may contain inherent errors or omissions  not intended by the user. Grammatical errors, random word insertions, deletions, pronoun errors and incomplete sentences are occasional consequences of this technology due to software limitations. Not all errors are caught or corrected. If there are questions or concerns about the content of this note or information contained within the body of this dictation they should be addressed directly with the author for clarification."      Oxygen saturation by pulse oximetry is 95%-100%, Normal.  Interventions: Patient Observed.     I reviewed all labs and/or radiology studies.     I reviewed nursing recorded vitals and history including PMSFHX         CRITICAL CARE    Time: 45 total number of minutes spent in direct and indirect care of this critically ill patient excluding procedure time.    Differential diagnosis: Dehydration, electrolyte imbalance, DKA, gastroenteritis, intracranial pathology    Labs, meds, IV fluids, CT of the head, re-eval    Accu-Chek here is greater than 600.  Will await results of potassium and bicarb prior to insulin administration.    WBC 17.34.  Glucose 968, BUN 103, creatinine 3.9, sodium 133.  CO2 12.  Lactic acid 6.2.  Patient patient's abnormal labs are due to DKA and not sepsis.  Patient is being treated for DKA.  Discussed case with Dr. Earlene Plater from [facility] who accepted patient to stay at [facility] and gave authorization #[id number].    CT of the head does not show any acute intracranial pathology.    Discussed case with Dr. Sheryn Bison who admitted patient to the ICU.    Discussed study results and treatment plan with patient and family                   Procedures    Clinical Impression & Disposition     Clinical Impression  Final diagnoses:   Diabetic ketoacidosis without coma associated with other specified diabetes mellitus   Hypotension, unspecified hypotension type   Acute kidney injury   Dehydration   Metabolic acidosis   Other elevated white blood cell (WBC) count        ED  Disposition     ED Disposition Condition Date/Time Comment    Admit  Tue Aug 14, 2018 12:15 AM Admitting Physician: Kirstie Mirza I [phone]   Diagnosis: DKA (diabetic ketoacidoses) [295621]   Estimated Length of Stay: 3 - 5 Days   Tentative Discharge Plan?: Home or Self Care [1]   Patient Class: Inpatient [101]   Bed request comments: icu   Update Service: Med/Surg Critical Care [[id number]]             New Prescriptions    No medications on file                 Janese Banks, MD  08/14/18 [phone]

## 2018-08-13 NOTE — ED Triage Notes (Signed)
Pt arrives to ED for NVD, weakness, and anorexia x3 days. Pt has hx of open heart x2 months ago, stroke, DM. Family reports that pt was too weak to walk today and fell around 1330 today. Pt is slow to respond.

## 2018-08-13 NOTE — ED Notes (Signed)
FSBG >600

## 2018-08-14 ENCOUNTER — Inpatient Hospital Stay: Payer: PRIVATE HEALTH INSURANCE

## 2018-08-14 DIAGNOSIS — E1110 Type 2 diabetes mellitus with ketoacidosis without coma: Secondary | ICD-10-CM | POA: Diagnosis present

## 2018-08-14 LAB — COMPREHENSIVE METABOLIC PANEL
ALT: 7 U/L (ref 0–55)
ALT: 7 U/L (ref 0–55)
ALT: 7 U/L (ref 0–55)
AST (SGOT): 7 U/L (ref 5–34)
AST (SGOT): 6 U/L (ref 5–34)
AST (SGOT): 7 U/L (ref 5–34)
Albumin/Globulin Ratio: 0.9 (ref 0.9–2.2)
Albumin/Globulin Ratio: 0.9 (ref 0.9–2.2)
Albumin/Globulin Ratio: 0.9 (ref 0.9–2.2)
Albumin: 3.6 g/dL (ref 3.5–5.0)
Albumin: 2.9 g/dL — ABNORMAL LOW (ref 3.5–5.0)
Albumin: 2.9 g/dL — ABNORMAL LOW (ref 3.5–5.0)
Alkaline Phosphatase: 195 U/L — ABNORMAL HIGH (ref 38–106)
Alkaline Phosphatase: 157 U/L — ABNORMAL HIGH (ref 38–106)
Alkaline Phosphatase: 148 U/L — ABNORMAL HIGH (ref 38–106)
Anion Gap: 37 — ABNORMAL HIGH (ref 5.0–15.0)
Anion Gap: 20 — ABNORMAL HIGH (ref 5.0–15.0)
Anion Gap: 10 (ref 5.0–15.0)
BUN: 103 mg/dL — ABNORMAL HIGH (ref 9.0–28.0)
BUN: 95.3 mg/dL — ABNORMAL HIGH (ref 9.0–28.0)
BUN: 86.4 mg/dL — ABNORMAL HIGH (ref 9.0–28.0)
Bilirubin, Total: 0.4 mg/dL (ref 0.2–1.2)
Bilirubin, Total: 0.3 mg/dL (ref 0.2–1.2)
Bilirubin, Total: 0.3 mg/dL (ref 0.2–1.2)
CO2: 12 mEq/L — ABNORMAL LOW (ref 22–29)
CO2: 20 mEq/L — ABNORMAL LOW (ref 22–29)
CO2: 26 mEq/L (ref 22–29)
Calcium: 9.7 mg/dL (ref 8.5–10.5)
Calcium: 8.4 mg/dL — ABNORMAL LOW (ref 8.5–10.5)
Calcium: 8.8 mg/dL (ref 8.5–10.5)
Chloride: 84 mEq/L — ABNORMAL LOW (ref 100–111)
Chloride: 101 mEq/L (ref 100–111)
Chloride: 110 mEq/L (ref 100–111)
Creatinine: 3.9 mg/dL — ABNORMAL HIGH (ref 0.7–1.3)
Creatinine: 3.3 mg/dL — ABNORMAL HIGH (ref 0.7–1.3)
Creatinine: 2.7 mg/dL — ABNORMAL HIGH (ref 0.7–1.3)
Globulin: 3.9 g/dL — ABNORMAL HIGH (ref 2.0–3.6)
Globulin: 3.1 g/dL (ref 2.0–3.6)
Globulin: 3.2 g/dL (ref 2.0–3.6)
Glucose: 968 mg/dL (ref 70–100)
Glucose: 693 mg/dL (ref 70–100)
Glucose: 242 mg/dL — ABNORMAL HIGH (ref 70–100)
Potassium: 4.9 mEq/L (ref 3.5–5.1)
Potassium: 3.8 mEq/L (ref 3.5–5.1)
Potassium: 3.8 mEq/L (ref 3.5–5.1)
Protein, Total: 7.5 g/dL (ref 6.0–8.3)
Protein, Total: 6 g/dL (ref 6.0–8.3)
Protein, Total: 6.1 g/dL (ref 6.0–8.3)
Sodium: 133 mEq/L — ABNORMAL LOW (ref 136–145)
Sodium: 141 mEq/L (ref 136–145)
Sodium: 146 mEq/L — ABNORMAL HIGH (ref 136–145)

## 2018-08-14 LAB — MAN DIFF ONLY
Band Neutrophils Absolute: 1.21 10*3/uL — ABNORMAL HIGH (ref 0.00–1.00)
Band Neutrophils Absolute: 0.89 10*3/uL (ref 0.00–1.00)
Band Neutrophils: 7 %
Band Neutrophils: 7 %
Basophils Absolute Manual: 0 10*3/uL (ref 0.00–0.08)
Basophils Absolute Manual: 0 10*3/uL (ref 0.00–0.08)
Basophils Manual: 0 %
Basophils Manual: 0 %
Eosinophils Absolute Manual: 0 10*3/uL (ref 0.00–0.44)
Eosinophils Absolute Manual: 0 10*3/uL (ref 0.00–0.44)
Eosinophils Manual: 0 %
Eosinophils Manual: 0 %
Lymphocytes Absolute Manual: 0.87 10*3/uL (ref 0.42–3.22)
Lymphocytes Absolute Manual: 1.91 10*3/uL (ref 0.42–3.22)
Lymphocytes Manual: 5 %
Lymphocytes Manual: 15 %
Monocytes Absolute: 0.52 10*3/uL (ref 0.21–0.85)
Monocytes Absolute: 0.76 10*3/uL (ref 0.21–0.85)
Monocytes Manual: 3 %
Monocytes Manual: 6 %
Neutrophils Absolute Manual: 14.74 10*3/uL — ABNORMAL HIGH (ref 1.10–6.33)
Neutrophils Absolute Manual: 9.15 10*3/uL — ABNORMAL HIGH (ref 1.10–6.33)
Segmented Neutrophils: 85 %
Segmented Neutrophils: 72 %

## 2018-08-14 LAB — GLUCOSE WHOLE BLOOD - POCT
Whole Blood Glucose POCT: >600 mg/dL (ref 70–100)
Whole Blood Glucose POCT: >600 mg/dL (ref 70–100)
Whole Blood Glucose POCT: 398 mg/dL — ABNORMAL HIGH (ref 70–100)
Whole Blood Glucose POCT: 500 mg/dL — ABNORMAL HIGH (ref 70–100)
Whole Blood Glucose POCT: 336 mg/dL — ABNORMAL HIGH (ref 70–100)
Whole Blood Glucose POCT: 355 mg/dL — ABNORMAL HIGH (ref 70–100)
Whole Blood Glucose POCT: 225 mg/dL — ABNORMAL HIGH (ref 70–100)
Whole Blood Glucose POCT: 167 mg/dL — ABNORMAL HIGH (ref 70–100)
Whole Blood Glucose POCT: 217 mg/dL — ABNORMAL HIGH (ref 70–100)
Whole Blood Glucose POCT: 257 mg/dL — ABNORMAL HIGH (ref 70–100)
Whole Blood Glucose POCT: 359 mg/dL — ABNORMAL HIGH (ref 70–100)
Whole Blood Glucose POCT: 387 mg/dL — ABNORMAL HIGH (ref 70–100)
Whole Blood Glucose POCT: 316 mg/dL — ABNORMAL HIGH (ref 70–100)

## 2018-08-14 LAB — URINALYSIS REFLEX TO MICROSCOPIC EXAM - REFLEX TO CULTURE
Bilirubin, UA: NEGATIVE
Bilirubin, UA: NEGATIVE
Glucose, UA: >=500 — AB
Glucose, UA: >=500 — AB
Ketones UA: 20 — AB
Ketones UA: 5 — AB
Leukocyte Esterase, UA: NEGATIVE
Nitrite, UA: NEGATIVE
Nitrite, UA: NEGATIVE
Protein, UR: NEGATIVE
Protein, UR: 100 — AB
Specific Gravity UA: 1.024 (ref 1.001–1.035)
Specific Gravity UA: 1.022 (ref 1.001–1.035)
Urine pH: 6 (ref 5.0–8.0)
Urine pH: 6 (ref 5.0–8.0)
Urobilinogen, UA: NORMAL mg/dL (ref 0.2–2.0)
Urobilinogen, UA: NORMAL mg/dL (ref 0.2–2.0)

## 2018-08-14 LAB — LACTIC ACID, PLASMA
Lactic Acid: 2.9 mmol/L — ABNORMAL HIGH (ref 0.2–2.0)
Lactic Acid: 2.5 mmol/L — ABNORMAL HIGH (ref 0.2–2.0)
Lactic Acid: 3.2 mmol/L — ABNORMAL HIGH (ref 0.2–2.0)
Lactic Acid: 1.4 mmol/L (ref 0.2–2.0)
Lactic Acid: 1.8 mmol/L (ref 0.2–2.0)

## 2018-08-14 LAB — BASIC METABOLIC PANEL
Anion Gap: 28 — ABNORMAL HIGH (ref 5.0–15.0)
Anion Gap: 18 — ABNORMAL HIGH (ref 5.0–15.0)
Anion Gap: 8 (ref 5.0–15.0)
Anion Gap: 10 (ref 5.0–15.0)
BUN: 103.4 mg/dL — ABNORMAL HIGH (ref 9.0–28.0)
BUN: 91.3 mg/dL — ABNORMAL HIGH (ref 9.0–28.0)
BUN: 70 mg/dL — ABNORMAL HIGH (ref 9.0–28.0)
BUN: 76.4 mg/dL — ABNORMAL HIGH (ref 9.0–28.0)
CO2: 14 mEq/L — ABNORMAL LOW (ref 22–29)
CO2: 21 mEq/L — ABNORMAL LOW (ref 22–29)
CO2: 21 mEq/L — ABNORMAL LOW (ref 22–29)
CO2: 22 mEq/L (ref 22–29)
Calcium: 8.3 mg/dL — ABNORMAL LOW (ref 8.5–10.5)
Calcium: 8.6 mg/dL (ref 8.5–10.5)
Calcium: 6.8 mg/dL — ABNORMAL LOW (ref 8.5–10.5)
Calcium: 8.1 mg/dL — ABNORMAL LOW (ref 8.5–10.5)
Chloride: 94 mEq/L — ABNORMAL LOW (ref 100–111)
Chloride: 103 mEq/L (ref 100–111)
Chloride: 119 mEq/L — ABNORMAL HIGH (ref 100–111)
Chloride: 113 mEq/L — ABNORMAL HIGH (ref 100–111)
Creatinine: 3.6 mg/dL — ABNORMAL HIGH (ref 0.7–1.3)
Creatinine: 3.2 mg/dL — ABNORMAL HIGH (ref 0.7–1.3)
Creatinine: 2.3 mg/dL — ABNORMAL HIGH (ref 0.7–1.3)
Creatinine: 2.4 mg/dL — ABNORMAL HIGH (ref 0.7–1.3)
Glucose: 932 mg/dL (ref 70–100)
Glucose: 591 mg/dL (ref 70–100)
Glucose: 844 mg/dL (ref 70–100)
Glucose: 420 mg/dL — ABNORMAL HIGH (ref 70–100)
Potassium: 4.6 mEq/L (ref 3.5–5.1)
Potassium: 3.8 mEq/L (ref 3.5–5.1)
Potassium: 3.4 mEq/L — ABNORMAL LOW (ref 3.5–5.1)
Potassium: 5.2 mEq/L — ABNORMAL HIGH (ref 3.5–5.1)
Sodium: 136 mEq/L (ref 136–145)
Sodium: 142 mEq/L (ref 136–145)
Sodium: 148 mEq/L — ABNORMAL HIGH (ref 136–145)
Sodium: 145 mEq/L (ref 136–145)

## 2018-08-14 LAB — CBC AND DIFFERENTIAL
Absolute NRBC: 0 10*3/uL (ref 0.00–0.00)
Absolute NRBC: 0 10*3/uL (ref 0.00–0.00)
Hematocrit: 49.4 % (ref 37.6–49.6)
Hematocrit: 39.9 % (ref 37.6–49.6)
Hgb: 15.8 g/dL (ref 12.5–17.1)
Hgb: 13.6 g/dL (ref 12.5–17.1)
MCH: 29 pg (ref 25.1–33.5)
MCH: 28.6 pg (ref 25.1–33.5)
MCHC: 32 g/dL (ref 31.5–35.8)
MCHC: 34.1 g/dL (ref 31.5–35.8)
MCV: 90.6 fL (ref 78.0–96.0)
MCV: 84 fL (ref 78.0–96.0)
MPV: 14 fL — ABNORMAL HIGH (ref 8.9–12.5)
MPV: 12.7 fL — ABNORMAL HIGH (ref 8.9–12.5)
Nucleated RBC: 0 /100 WBC (ref 0.0–0.0)
Nucleated RBC: 0 /100 WBC (ref 0.0–0.0)
Platelets: 272 10*3/uL (ref 142–346)
Platelets: 199 10*3/uL (ref 142–346)
RBC: 5.45 10*6/uL (ref 4.20–5.90)
RBC: 4.75 10*6/uL (ref 4.20–5.90)
RDW: 13 % (ref 11–15)
RDW: 13 % (ref 11–15)
WBC: 17.34 10*3/uL — ABNORMAL HIGH (ref 3.10–9.50)
WBC: 12.71 10*3/uL — ABNORMAL HIGH (ref 3.10–9.50)

## 2018-08-14 LAB — TROPONIN I: Troponin I: 0.26 ng/mL — ABNORMAL HIGH (ref 0.00–0.05)

## 2018-08-14 LAB — PHOSPHORUS
Phosphorus: 3.8 mg/dL (ref 2.3–4.7)
Phosphorus: 2.5 mg/dL (ref 2.3–4.7)

## 2018-08-14 LAB — MAGNESIUM
Magnesium: 3 mg/dL — ABNORMAL HIGH (ref 1.6–2.6)
Magnesium: 2.8 mg/dL — ABNORMAL HIGH (ref 1.6–2.6)

## 2018-08-14 LAB — PT AND APTT
PT INR: 1.1 (ref 0.9–1.1)
PT: 14.4 s (ref 12.6–15.0)
PTT: 24 s (ref 23–37)

## 2018-08-14 LAB — GFR
EGFR: 17.4
EGFR: 19.2
EGFR: 19.9
EGFR: 24.2
EGFR: 29.1
EGFR: 27.7

## 2018-08-14 LAB — CELL MORPHOLOGY
Cell Morphology: NORMAL
Cell Morphology: NORMAL
Platelet Estimate: NORMAL
Platelet Estimate: NORMAL

## 2018-08-14 LAB — CK: Creatine Kinase (CK): 54 U/L (ref 47–267)

## 2018-08-14 MED ORDER — INFLUENZA VAC SPLIT QUAD 0.5 ML IM SUSY
0.50 mL | PREFILLED_SYRINGE | Freq: Once | INTRAMUSCULAR | Status: DC
Start: 2018-08-14 — End: 2018-08-14

## 2018-08-14 MED ORDER — SODIUM CHLORIDE 0.9 % IV MBP
1.00 g | INTRAVENOUS | Status: DC
Start: 2018-08-14 — End: 2018-08-14
  Administered 2018-08-14: 13:00:00 1 g via INTRAVENOUS
  Filled 2018-08-14: qty 1000

## 2018-08-14 MED ORDER — RISAQUAD PO CAPS
1.00 | ORAL_CAPSULE | Freq: Every day | ORAL | Status: DC
Start: 2018-08-14 — End: 2018-08-14
  Administered 2018-08-14: 13:00:00 1 via ORAL
  Filled 2018-08-14: qty 1

## 2018-08-14 MED ORDER — SODIUM CHLORIDE 0.9 % IV BOLUS
1000.00 mL | Freq: Once | INTRAVENOUS | Status: AC
Start: 2018-08-14 — End: 2018-08-14
  Administered 2018-08-14: 12:00:00 1000 mL via INTRAVENOUS

## 2018-08-14 MED ORDER — SODIUM CHLORIDE 0.9 % IV SOLN
0.10 [IU]/kg/h | INTRAVENOUS | Status: DC
Start: 2018-08-14 — End: 2018-08-14

## 2018-08-14 MED ORDER — INSULIN GLARGINE 100 UNIT/ML SC SOLN
20.00 [IU] | Freq: Every day | SUBCUTANEOUS | Status: DC
Start: 2018-08-14 — End: 2018-08-14
  Administered 2018-08-14: 13:00:00 20 [IU] via SUBCUTANEOUS
  Filled 2018-08-14: qty 20

## 2018-08-14 MED ORDER — ALBUTEROL-IPRATROPIUM 2.5-0.5 (3) MG/3ML IN SOLN
3.00 mL | RESPIRATORY_TRACT | Status: DC
Start: 2018-08-14 — End: 2018-08-14

## 2018-08-14 MED ORDER — ALBUTEROL-IPRATROPIUM 2.5-0.5 (3) MG/3ML IN SOLN
3.00 mL | RESPIRATORY_TRACT | Status: DC
Start: 2018-08-14 — End: 2018-08-14
  Administered 2018-08-14 (×2): 3 mL via RESPIRATORY_TRACT
  Filled 2018-08-14: qty 3

## 2018-08-14 MED ORDER — INSULIN GLARGINE 100 UNIT/ML SC SOLN
20.00 [IU] | Freq: Every evening | SUBCUTANEOUS | Status: DC
Start: 2018-08-14 — End: 2018-08-14

## 2018-08-14 MED ORDER — DEXTROSE 50 % IV SOLN
12.50 g | INTRAVENOUS | Status: DC | PRN
Start: 2018-08-14 — End: 2018-08-14

## 2018-08-14 MED ORDER — INSULIN GLARGINE 100 UNIT/ML SC SOLN
15.00 [IU] | Freq: Every evening | SUBCUTANEOUS | Status: DC
Start: 2018-08-14 — End: 2018-08-14
  Administered 2018-08-14: 17:00:00 15 [IU] via SUBCUTANEOUS
  Filled 2018-08-14: qty 15

## 2018-08-14 MED ORDER — DEXTROSE-NACL 5-0.9 % IV SOLN
INTRAVENOUS | Status: DC
Start: 2018-08-14 — End: 2018-08-14

## 2018-08-14 MED ORDER — INSULIN REGULAR HUMAN 100 UNIT/ML IJ SOLN
10.00 [IU]/h | INTRAMUSCULAR | Status: DC
Start: 2018-08-14 — End: 2018-08-14
  Administered 2018-08-14: 10:00:00 5 [IU]/h via INTRAVENOUS

## 2018-08-14 MED ORDER — GLUCAGON 1 MG IJ SOLR (WRAP)
1.00 mg | INTRAMUSCULAR | Status: DC | PRN
Start: 2018-08-14 — End: 2018-08-14

## 2018-08-14 MED ORDER — FAMOTIDINE 10 MG/ML IV SOLN (WRAP)
20.00 mg | Freq: Every day | INTRAVENOUS | Status: DC
Start: 2018-08-14 — End: 2018-08-14
  Administered 2018-08-14: 11:00:00 20 mg via INTRAVENOUS
  Filled 2018-08-14: qty 2

## 2018-08-14 MED ORDER — DEXTROSE 50 % IV SOLN
25.00 mL | INTRAVENOUS | Status: DC | PRN
Start: 2018-08-14 — End: 2018-08-14

## 2018-08-14 MED ORDER — HEPARIN SODIUM (PORCINE) 5000 UNIT/ML IJ SOLN
5000.00 [IU] | Freq: Two times a day (BID) | INTRAMUSCULAR | Status: DC
Start: 2018-08-14 — End: 2018-08-14
  Administered 2018-08-14: 11:00:00 5000 [IU] via SUBCUTANEOUS
  Filled 2018-08-14: qty 1

## 2018-08-14 MED ORDER — SODIUM CHLORIDE 0.9 % IV SOLN
INTRAVENOUS | Status: DC
Start: 2018-08-14 — End: 2018-08-14

## 2018-08-14 MED ORDER — GLUCOSE 40 % PO GEL
15.00 g | ORAL | Status: DC | PRN
Start: 2018-08-14 — End: 2018-08-14

## 2018-08-14 MED ORDER — INSULIN LISPRO 100 UNIT/ML SC SOLN
1.00 [IU] | SUBCUTANEOUS | Status: DC
Start: 2018-08-14 — End: 2018-08-14
  Administered 2018-08-14: 13:00:00 3 [IU] via SUBCUTANEOUS
  Administered 2018-08-14: 17:00:00 8 [IU] via SUBCUTANEOUS
  Filled 2018-08-14: qty 24
  Filled 2018-08-14: qty 9

## 2018-08-14 MED ORDER — SODIUM CHLORIDE 0.9 % IV BOLUS
500.00 mL | Freq: Once | INTRAVENOUS | Status: AC
Start: 2018-08-14 — End: 2018-08-14
  Administered 2018-08-14: 03:00:00 500 mL via INTRAVENOUS

## 2018-08-14 MED ORDER — PIPERACILLIN SOD-TAZOBACTAM SO 4.5 (4-0.5) G IV SOLR
4.50 g | Freq: Three times a day (TID) | INTRAVENOUS | Status: DC
Start: 2018-08-14 — End: 2018-08-14
  Administered 2018-08-14: 17:00:00 4.5 g via INTRAVENOUS
  Filled 2018-08-14: qty 20

## 2018-08-14 NOTE — Discharge Instructions (Signed)
Diabetic Ketoacidosis  Diabetic ketoacidosis (DKA) is a serious problem that can happen in people with diabetes. DKA should be treated as a medical emergency. This is because it can lead to coma or death. If you have the symptoms of DKA, get medical help right away.  DKA happens more often in people with type 1 diabetes. But it can happen in people with type 2 diabetes. It can also happen in women with diabetes during pregnancy. This is often known as gestational diabetes.  DKA happens when insulin levels are too low. Without enough insulin, sugar (glucose) can't get to the cells of your body. The glucose stays in the blood. This causes high blood glucose (hyperglycemia). Without glucose, your body breaks down stored fat for energy. When this happens, acids called ketones are released into the blood. This is known as ketosis. High levels of ketones (ketoacidosis) can be harmful to you. Hyperglycemia and ketoacidosis can also cause serious problems in the blood and your body, such as:   Low levels of potassium (hypokalemia)   Swelling inside the brain (cerebral edema)   Fluid in the lungs (pulmonary edema)   Damage to kidneys or other organs  What causes diabetic ketoacidosis?  In people with diabetes, DKA is most often caused by too little insulin in the body. It is also caused by:   Poor management of diabetes   Infections like a urinary tract infection or pneumonia   Serious health problems, such as a heart attack   Reactions to certain prescribed medicines and illicit drugs  Symptoms of diabetic ketoacidosis  DKA most often happens slowly over time. But it can worsen in a few hours if you are vomiting. The first symptoms are:   Thirst and dry mouth   Urinating a lot   Belly pain   Nausea or vomiting   Breath that smells fruity (from the ketones)  Over time, these symptoms may happen:   Dry or flushed skin   Nausea and vomiting   Loss of appetite   Weight loss   Belly pain   Trouble  breathing   Trouble thinking or confusion   Feeling very tired or weak. This can lead to coma.  How is diabetic ketoacidosis diagnosed?  Your healthcare provider will ask about your medical history. He or she will give you a physical exam. You may also have these tests:   Blood tests to check your glucose levels   Blood tests to check your electrolytes, such as potassium and sodium   Urine test to check for ketones  These tests are done to check for DKA, and monitor it over time.  How is diabetic ketoacidosis treated?  DKA needs treatment right away in the hospital. Treatment includes:   Insulin. This is the main type of treatment. Insulin allows the cells to use the glucose in the blood. This lowers the levels of both blood glucose and ketones.   Fluids and electrolytes. These are given through a vein (IV). Fluids are replaced and abnormal electrolyte levels are corrected.   Other medicines. These may be given to treat an illness that caused DKA. For example, antibiotics may be given to treat a urinary tract infection that caused DKA.  Preventing diabetic ketoacidosis  To help prevent DKA, make sure you:   Take all of your medicines for diabetes exactly as prescribed. This includes insulin.   Check your blood glucose levels exactly as instructed.   Be especially careful when you are sick with an illness   or an infection. Take extra care to follow diabetes care instructions. Check your blood glucose more often.   Do not exercise when your blood sugar is high and you have ketones in your urine.   Check your urine ketone levels if told to do so. This is done with a urine test strip. Ask your healthcare provider how often to check your urine.    When to call your healthcare provider  Call your healthcare provider right away if you:   Have symptoms of DKA   Have very high blood glucose levels   Are getting sick with another illness   Date Last Reviewed: 04/17/2015   2000-2019 [employer]  [address], [place] [address]. All rights reserved. This information is not intended as a substitute for professional medical care. Always follow your healthcare professional's instructions.

## 2018-08-14 NOTE — ED Notes (Signed)
Nursing Communication - Admission Information:  Diagnosis:   1. Diabetic ketoacidosis without coma associated with other specified diabetes mellitus     2. Hypotension, unspecified hypotension type     3. Acute kidney injury     4. Dehydration     5. Metabolic acidosis     6. Other elevated white blood cell (WBC) count       Mobility: 2 person assist  Isolation: Contact Special  Patient comes from: home  Neuro: A&Ox3  Special needs: HOH   Drips:   Current Facility-Administered Medications   Medication Dose Route Frequency Last Rate   . 0.9 % NaCl with KCl 20 mEq  150 mL/hr Intravenous Continuous 150 mL/hr (08/14/18 0028)   . insulin regular  0.1 Units/kg/hr Intravenous Continuous 6.5 Units/hr (08/14/18 0038)

## 2018-08-14 NOTE — Progress Notes (Signed)
08/14/18 0615   Provider Notification   Reason for Communication Critical lab value   Time of Critical Value Notification 0611   Critical Lab Chemistry   Critical Lab Value  Glucose 591   Total Notification Time to LIP (minutes) 4   Provider Name Dr. Lesle Reek   Provider Role Intensivist   Readback Results Yes   Response No new orders   Notification Time 0615

## 2018-08-14 NOTE — UM Notes (Shared)
Pnt presented to ED on 08/13/18 and was transferred to ICU as inpatient on 08/14/18    62 yo male presents with nausea vomiting diarrhea since Friday.  Symptoms were slow onset, initially mild no moderate, continues to be mild to moderate, constant, nothing makes it better or worse, no associated pain.  Patient has not been eating or drinking much fluid over the past few days but taking medications intermittently only.  Patient supposedly fell yesterday and hit his head.    VS: T 96.6, p 84 SATS 93%, resp 30, bp 88/59    Abnormal Labs: wbc 17.34,  Glucose 968, bun 103, cr 3.9 up from 1.2, na 133, chl 84, co2 12, alk phos 195, anion gap 37, lactic acid 6.2 and 2.9,   Blood cultures pending     Diagnostics:  CT head: 1. No CT evidence of acute intracranial abnormality.  2. Nonspecific supratentorial white matter hypoattenuation likely  representing chronic small vessel ischemic change.    ED Meds: nacl bolus iv, insulin iv, nacl with kcl iv.     Patient transferred to ICU as inpatient on 08/14/18 for DKA.     Assessment and Plan per MD:    Orders: pepcid iv daily, heparin sc q 12 hrs, insulin cont 10u/hr, clear liquid diet, CXR q am, contact iso, cmp,mag, stool for wbc, [place] cx, [place] parasite stool, rotavirus stool, c diff stool, foley cath,

## 2018-08-14 NOTE — UM Notes (Signed)
[facility] UM Department [phone]  Fax [phone]  INS: Mikael Spray    Per curtis m - ip ref #  [id number] - is already on file  for [facility] admission    Admit Date 08/14/18 Inpatient status to ICU.    Pnt presented to ED on 08/13/18 and was transferred to ICU as inpatient on 08/14/18    62 yo male presents with nausea vomiting diarrhea since Friday.  Symptoms were slow onset, initially mild no moderate, continues to be mild to moderate, constant, nothing makes it better or worse, no associated pain.  Patient has not been eating or drinking much fluid over the past few days but taking medications intermittently only.  Patient supposedly fell yesterday and hit his head.    VS: T 96.6, p 84 SATS 93%, resp 30, bp 88/59    Abnormal Labs: wbc 17.34,  Glucose 968, bun 103, cr 3.9 up from 1.2, na 133, chl 84, co2 12, alk phos 195, anion gap 37, lactic acid 6.2 and 2.9,   Blood cultures pending     Diagnostics:  CT head: 1. No CT evidence of acute intracranial abnormality.  2. Nonspecific supratentorial white matter hypoattenuation likely  representing chronic small vessel ischemic change.    ED Meds: nacl bolus iv, insulin iv, nacl with kcl iv.     Patient transferred to ICU as inpatient on 08/14/18 for DKA.     Assessment and Plan per MD:    Orders: pepcid iv daily, heparin sc q 12 hrs, insulin cont 10u/hr, clear liquid diet, CXR q am, contact iso, cmp,mag, stool for wbc, [place] cx, O[place] nd parasite stool, rotavirus stool, c diff stool, foley cath,     Per H&P:    In April 2019 the patient was hospitalized for non-STEMI.  The patient was transferred to a [place] facility.  He was on heparin, insulin.  According to records, creatinine in August 2019 was 1.0.  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  No further history is available at this time.  Family not present at the time of our examination.    Assessment:   62 year old male with coronary artery disease, diabetes mellitus,  hypercholesterolemia, hypertension, prior non-STEMI, presenting with diabetic ketoacidosis, severe hyperglycemia, acute renal failure.  The latter likely related to volume depletion and dehydration.  Plan:   IV insulin infusion, intravenous fluids for volume resuscitation, electrolyte management, monitor kidney function.  Trend cardiac enzymes.  Contact [facility] for records and arrange for further treatment.    Currently on IVF + KCL at , Insulin Gtt ongoing.     Repeat WBC 12.17, latest BG: > 600, 500, 257.  Repeat BUN 86.4, creat 2.7. Mg 2.8.     Plan to continue current treatment, communicate with Mikael Spray MD.  CM has communicated MD contact number to [facility] CM.    Clinicals faxed to [facility].

## 2018-08-14 NOTE — Progress Notes (Signed)
Patient was found to have removed monitoring equipment and 2 PIVs.  Dr. Lesle Reek aware.  Restraints ordered. IVF's adjusted.  Wife at bedside was updated.  Report given to oncoming RN.

## 2018-08-14 NOTE — Plan of Care (Signed)
Problem: Fluid and Electrolyte Imbalance/ Endocrine  Goal: Fluid and electrolyte balance are achieved/maintained  Outcome: Progressing  Flowsheets (Taken 08/14/2018 1521)  Fluid and electrolyte balance are achieved/maintained: Monitor intake and output every shift; Provide adequate hydration; Monitor daily weight; Observe for seizure activity and initiate seizure precautions if indicated; Observe for cardiac arrhythmias; Assess and reassess fluid and electrolyte status; Monitor for muscle weakness; Assess for confusion/personality changes; Monitor/assess lab values and report abnormal values

## 2018-08-14 NOTE — H&P (Signed)
ADMISSION HISTORY AND PHYSICAL EXAM    Date Time: 08/14/18 2:46 AM  Patient Name: Keith Gates  Attending Physician: Janese Banks, MD    Assessment:   62 year old male with coronary artery disease, diabetes mellitus, hypercholesterolemia, hypertension, prior non-STEMI, presenting with diabetic ketoacidosis, severe hyperglycemia, acute renal failure.  The latter likely related to volume depletion and dehydration.  Plan:   IV insulin infusion, intravenous fluids for volume resuscitation, electrolyte management, monitor kidney function.  Trend cardiac enzymes.  Contact [facility] for records and arrange for further treatment.    History of Present Illness:   Keith Gates is a 62 y.o. male with history of diabetes mellitus hypercholesterolemia hypertension prior stroke and a nonSTEMI in April 2019, CABG 2 months ago.  The patient presented to the emergency department on the night of admission with anorexia for 3 days and generalized weakness.  The patient had nausea and vomiting.  There was also loose stools.  The patient denied acute abdominal pain or chest pain.  Reportedly the patient fell around 1:30 in the afternoon and hit his head.  There was no loss of consciousness.  The patient and his wife were poor historians and could not describe adequately the maintenance medication treatment.  The patient was found to have diabetic ketoacidosis with severe hyperglycemia greater than 900 and acute kidney injury with creatinine greater than 3.  He is admitted to intensive care unit for further treatment.  CT of the head was unremarkable  In April 2019 the patient was hospitalized for non-STEMI.  The patient was transferred to a [place] facility.  He was on heparin, insulin.  According to records, creatinine in August 2019 was 1.0.  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  No further history is available at this time.  Family not present at the time of our examination.  Past  Medical History:     Past Medical History:   Diagnosis Date   . Diabetes mellitus    . Elevated cholesterol    . Hypertension    . Stroke    Non STEMI April 2019.  Ischemic cardiomyopathy, echocardiogram with global hypokinesis and left ventricular ejection fraction 20 to 25%.  Echodensity in the apex felt to be thrombus.    Past Surgical History:   History reviewed. No pertinent surgical history.    Family History:   History reviewed. No pertinent family history.    Social History:     Social History     Socioeconomic History   . Marital status: Married     Spouse name: Not on file   . Number of children: Not on file   . Years of education: Not on file   . Highest education level: Not on file   Occupational History   . Not on file   Social Needs   . Financial resource strain: Not on file   . Food insecurity:     Worry: Not on file     Inability: Not on file   . Transportation needs:     Medical: Not on file     Non-medical: Not on file   Tobacco Use   . Smoking status: Former Smoker     Years: 30.00     Last attempt to quit: 10/17/2002     Years since quitting: 15.8   . Smokeless tobacco: Never Used   Substance and Sexual Activity   . Alcohol use: Yes   . Drug use: No   .  Sexual activity: Not on file   Lifestyle   . Physical activity:     Days per week: Not on file     Minutes per session: Not on file   . Stress: Not on file   Relationships   . Social connections:     Talks on phone: Not on file     Gets together: Not on file     Attends religious service: Not on file     Active member of club or organization: Not on file     Attends meetings of clubs or organizations: Not on file     Relationship status: Not on file   . Intimate partner violence:     Fear of current or ex partner: Not on file     Emotionally abused: Not on file     Physically abused: Not on file     Forced sexual activity: Not on file   Other Topics Concern   . Not on file   Social History Narrative   . Not on file       Allergies:   No Known  Allergies    Medications:   (Not in a hospital admission)    Scheduled Meds:  Current Facility-Administered Medications   Medication Dose Route Frequency     Continuous Infusions:  . 0.9 % NaCl with KCl 20 mEq 150 mL/hr (08/14/18 0028)   . insulin regular 6.5 Units/hr (08/14/18 0038)     PRN Meds:.    Review of Systems:   A comprehensive review of systems was: Unable to assess in detail due to lethargy and confusion.  ENT ROS: negative for - epistaxis, headaches, nasal congestion, nasal polyps, oral lesions, sore throat, vertigo, visual changes or vocal changes  Hematological and Lymphatic ROS: negative for - bleeding problems, blood clots, jaundice, swollen lymph nodes or weight loss  Respiratory ROS: negative for - cough, hemoptysis, orthopnea, shortness of breath, stridor or wheezing.  Cardiovascular ROS: negative for - chest pain, irregular heartbeat, murmur or palpitations.  Prior history of non-STEMI.  Gastrointestinal ROS: Positive nausea and vomiting see history of present illness.  Positive diarrhea, no hematemesis no blood per rectum   genito-Urinary ROS: negative for - dysuria, hematuria or urinary frequency/urgency  Musculoskeletal ROS: negative for - joint pain, joint swelling or muscular weakness  Neurological ROS: negative for - confusion, gait disturbance, seizures or weakness  Dermatological ROS: negative for rash and skin lesion changes    Physical Exam:     Vitals:    08/14/18 0220   BP: 120/78   Pulse: 90   Resp: 18   Temp:    SpO2: 95%       Intake and Output Summary (Last 24 hours) at Date Time  No intake or output data in the 24 hours ending 08/14/18 0246    General appearance -somnolent arousable confused.  No acute respiratory distress  Mental status - arousable and responsive moves all extremities.  Somnolent confused.  Eyes - pupils equal and reactive, extraocular eye movements intact, sclera anicteric  Ears - bilateral TM's and external ear canals normal  Nose - normal and patent, no  erythema, discharge or polyps  Mouth - mucous membranes dry mouth, pharynx normal without lesions.  No thrush  Neck - supple, no significant adenopathy, no  Jugular venous distension  Lymphatics - no palpable lymphadenopathy, no hepatosplenomegaly  Chest - clear to auscultation, no wheezes, rales or rhonchi, symmetric air entry  Heart - normal rate, regular rhythm,  normal S1, S2, no murmurs, rubs, clicks or gallops  Abdomen - soft, nontender, nondistended, no masses or organomegaly  Rectal - deferred, not clinically indicated  Back exam -  no tenderness, palpable spasm or pain on motion  Neurological -no focal.  Mumbling speech.  No obvious dysarthria.  Extraocular movements intact.  Moves all extremities.  Sensory appears grossly normal  Musculoskeletal - no joint tenderness, deformity or swelling  Extremities - peripheral pulses normal, no pedal edema, no clubbing or cyanosis  Skin - normal coloration and turgor, no rashes, no suspicious skin lesions noted    Labs:   Recent CBC   Recent Labs   Lab 08/13/18  2247   WBC 17.34*   RBC 5.45   Hgb 15.8   Hematocrit 49.4   MCV 90.6   Platelets 272       Recent Labs   Lab 08/14/18  0147 08/13/18  2247   Sodium 136 133*   Potassium 4.6 4.9   Chloride 94* 84*   CO2 14* 12*   Glucose 932* 968*   BUN 103.4* 103.0*   Creatinine 3.6* 3.9*   AST (SGOT)  --  7   ALT  --  7   Alkaline Phosphatase  --  195*       Rads:     Radiology Results (24 Hour)     Procedure Component Value Units Date/Time    CT Head without Contrast [[id number]] Collected:  08/13/18 2307    Order Status:  Completed Updated:  08/13/18 2315    Narrative:       HISTORY: Fall. Head injury.    COMPARISON: None.    TECHNIQUE: A non-contrast CT of the head was performed.     This CT study was performed using radiation dose reduction techniques  including one or more of the following: automated exposure control,  adjustment of the mA and/or kV according to patient size, and the use of  iterative reconstruction  technique.    FINDINGS: No intracranial mass, hemorrhage, or hydrocephalus is  detected. There is generalized cerebral volume loss. There is  periventricular and subcortical white matter hypoattenuation that is  nonspecific but likely representing chronic small vessel ischemic  changes.  The basilar cisterns are clear. Intracranial vascular  calcifications are noted.    The calvarium and the skull base are intact.  The paranasal sinuses are  clear. The mastoid air cells are well aerated.      Impression:         1.  No CT evidence of acute intracranial abnormality.  2.  Nonspecific supratentorial white matter hypoattenuation likely  representing chronic small vessel ischemic change.    Demetrios Isaacs, MD   08/13/2018 11:10 PM      Urinalysis specific gravity 1.024, pH 6, glucose greater than 500, ketones 20  EKG sinus tachycardia, right bundle branch block, inferior Q waves    I have personally reviewed the patient's history and 24 hour interval events, along with vitals, labs, radiology images and  02 settings and additional findings found in detail within ICU team notes, with their care plans developed with and reviewed by me.    Time spent in patient evaluation and treatment in critical care excluding procedures, and not overlapping any other providers:  60   minutes.    Signed by: Durward Fortes, MD

## 2018-08-14 NOTE — Progress Notes (Signed)
Nutritional Support Services  Nutrition Screening    Keith Gates 62 y.o. male                                 MRN: [id number]    Referral Source: RN Admission SCRN  Reason for Referral: weight loss/decreased appetite pta    62 yo admitted on 10/28 with N/V for a few days pta, found with DKA, severe hyperglycemia, acute renal failure; patient underwent CABG surgery 2 months ago    Orders Placed This Encounter   Procedures   . Diet NPO effective now        Chart review completed.   Labs and meds noted.    Patient interviewed.    Patient reports he does not follow any diet restrictions for his DM.  Patient states he does what his doctor tells him to do and takes his medicine (no DM meds noted on home med review).  Last HgbA1C 7.0 (01/23/18).    Patient states he is 25" tall.  Bed Scale weight on admission was 65 kg.  Using admission weight, patient 83% IBW with BMI - 20.  Patient says he normally weighs 75 kg.  Patient currently 87% UBW.  However, patient feels he looks the same in the mirror, his clothes fit fine and he doesn't notice that he has lost weight.    Patient to possibly be transferred to a [facility] facility.  Recommend advancing diet to Carbohydrate Consistent and beginning Ensure High Protein supplements BID with meals.  Patient would also benefit from DM education when appropriate prior to discharge.       Jordan Hawks, RD, CNSC  Clinical Dietitian Specialist

## 2018-08-14 NOTE — Progress Notes (Addendum)
CM talked to Darl Pikes with Mikael Spray at [phone] and notified her that Dr. Nedra Hai is covering ICU today and her contact number is [phone] or [phone].  Darl Pikes is aware that patient is medically stable as per Dr. Nedra Hai to be transferred to the [facility] facility.  CM will follow up as needed.     Jonetta Osgood, RN MSN ACM  Clinical Case Manager II  [phone]

## 2018-08-14 NOTE — Progress Notes (Signed)
08/14/18 0530   Provider Notification   Reason for Communication Critical lab value   Time of Critical Value Notification 0520   Critical Lab Chemistry   Critical Lab Value 693 glucose   Total Notification Time to LIP (minutes) 10   Provider Name Dr. Lesle Reek   Provider Role Intensivist   Method of Communication Call   Readback Results Yes   Response No new orders   Notification Time 0530

## 2018-08-14 NOTE — Progress Notes (Signed)
Dr Nedra Hai aware of blood sugar results, new orders placed.   Orders instituted.

## 2018-08-14 NOTE — Discharge Instr - AVS First Page (Signed)
Reason for your Hospital Admission:  Diabetic ketoacidosis  GNR bacteremia  UTI      Instructions for after your discharge:  You will be transferred to [facility] to continue your medical treatment.

## 2018-08-14 NOTE — Final Progress Note (DC Note for stay less than 48 (Addendum)
Date:08/14/2018   Patient Name: Keith Gates  Attending Physician: Kirstie Mirza I, MD  Today:   BP 111/59   Pulse (!) 102   Temp 99 F (37.2 C) (Temporal)   Resp (!) 31   Ht 1.803 m (5\' 11" )   Wt 63.4 kg (139 lb 12.4 oz)   SpO2 96%   BMI 19.49 kg/m   Ranges for the last 24 hours:  Temp:  [95.9 F (35.5 C)-99 F (37.2 C)] 99 F (37.2 C)  Heart Rate:  [84-104] 102  Resp Rate:  [13-33] 31  BP: (88-154)/(52-84) 111/59    General:  Alert, more interactive  CV: ns1, ns2, 2/6 SEM at RSB  Pulm: mild bibasilar crackles  Abd; NT, ND +BS  Extremities: no lower extremity edema    Date of Admission:   08/13/2018    Date of Discharge:   08/14/2018    Outcome of Hospitalization:   Active Problems:    DKA (diabetic ketoacidoses)  Resolved Problems:    * No resolved hospital problems. *     Significant Events:  Patient had altered mental status on admission. He improved with alertness but is still confused.  He will try to remove his IV's so mitts used to help prevent removal of equipment.  He also was fed green jello and aspirated.  He was immediately suctioned but had increase in oxygen usage.  Patient states he feels "ok".     Discussed with Dr. Dimitri Ped triage physician regarding pt's medical condition and therapy. Answered her questions.      4:53pm  Re-evaluation, patient had GNR growing on blood culture,  Patient received Rocephin for positive UTI. Will upgrade to Zosyn until final results.  oxgyen still ok on 6 liter nasal cannula.     Repeat BMP originally was not accurate b/c it was drawn on fluid line.  Repeated bmp indicated anion gap is still closed. Elevated glucose 387, given more insulin and additional lantus.     Vital sign stable.     6pm  Updated Dr. Pearlie Oyster, [facility] triage physician regarding new labs and above information including repeat CXR.  Continue with transfer.       Lab Results last 48 Hours     Procedure Component Value Units Date/Time    Basic Metabolic Panel [[id number]]  Collected:  08/14/18 1549    Specimen:  Blood Updated:  08/14/18 1557    Glucose Whole Blood - POCT [[id number]]  (Abnormal) Collected:  08/14/18 1552     Updated:  08/14/18 1554     POCT - Glucose Whole blood 359 mg/dL     Blood Culture Aerobic and Anaerobic (age 60 or older) [[id number]] Collected:  08/13/18 2247    Specimen:  Blood, Venipuncture Updated:  08/14/18 1551    Narrative:       [id number] called Micro Results of Pos Bld. Results read back [id number], by [id number]  on 08/14/2018 at 15:51  ORDER#: [id number]                                    ORDERED BY: Reine Just  SOURCE: Blood, Venipuncture R AC                     COLLECTED:  08/13/18 22:47  ANTIBIOTICS AT COLL.:  RECEIVED :  08/14/18 02:18  [id number] called Micro Results of Pos Bld. Results read back [id number], by [id number] on 08/14/2018 at 15:51  Culture Blood Aerobic and Anaerobic        PRELIM      08/14/18 15:51   +  08/14/18   Anaerobic Blood Culture Positive in less than 24 hrs             Gram Stain Shows: Gram negative rods             Further workup to follow including susceptibility testing      Basic Metabolic Panel [[id number]]  (Abnormal) Collected:  08/14/18 1502    Specimen:  Blood Updated:  08/14/18 1541     Glucose 844 mg/dL      BUN 25.9 mg/dL      Creatinine 2.3 mg/dL      Calcium 6.8 mg/dL      Sodium 563 mEq/L      Potassium 3.4 mEq/L      Chloride 119 mEq/L      CO2 21 mEq/L      Anion Gap 8.0    GFR [[id number]] Collected:  08/14/18 1502     Updated:  08/14/18 1541     EGFR 29.1    Lactic acid, plasma [[id number]] Collected:  08/14/18 1502    Specimen:  Blood Updated:  08/14/18 1522     Lactic acid 1.4 mmol/L     Glucose Whole Blood - POCT [[id number]]  (Abnormal) Collected:  08/14/18 1309     Updated:  08/14/18 1314     POCT - Glucose Whole blood 257 mg/dL     Glucose Whole Blood - POCT [[id number]]  (Abnormal) Collected:  08/14/18 1216     Updated:  08/14/18 1255     POCT - Glucose Whole blood 217 mg/dL     Troponin  I [[id number]]  (Abnormal) Collected:  08/14/18 1030    Specimen:  Blood Updated:  08/14/18 1121     Troponin I 0.26 ng/mL     Glucose Whole Blood - POCT [[id number]]  (Abnormal) Collected:  08/14/18 1107     Updated:  08/14/18 1112     POCT - Glucose Whole blood 167 mg/dL     Manual Differential [[id number]]  (Abnormal) Collected:  08/14/18 1014     Updated:  08/14/18 1112     Segmented Neutrophils 72 %      Band Neutrophils 7 %      Lymphocytes Manual 15 %      Monocytes Manual 6 %      Eosinophils Manual 0 %      Basophils Manual 0 %      Abs Seg Manual 9.15 x10 3/uL      Bands Absolute 0.89 x10 3/uL      Absolute Lymph Manual 1.91 x10 3/uL      Monocytes Absolute 0.76 x10 3/uL      Absolute Eos Manual 0.00 x10 3/uL      Absolute Baso Manual 0.00 x10 3/uL     Cell MorpHology [[id number]] Collected:  08/14/18 1014     Updated:  08/14/18 1112     Cell Morphology: Normal     Platelet Estimate Normal    CBC and differential [[id number]]  (Abnormal) Collected:  08/14/18 1014    Specimen:  Blood Updated:  08/14/18 1112     WBC 12.71 x10 3/uL      Hgb 13.6 g/dL      Hematocrit 38.1 %  Platelets 199 x10 3/uL      RBC 4.75 x10 6/uL      MCV 84.0 fL      MCH 28.6 pg      MCHC 34.1 g/dL      RDW 13 %      MPV 12.7 fL      Nucleated RBC 0.0 /100 WBC      Absolute NRBC 0.00 x10 3/uL     UA Reflex to Micro - Reflex to Culture [[id number]]  (Abnormal) Collected:  08/14/18 1023     Updated:  08/14/18 1059     Urine Type Urine, Clean Ca     Color, UA Yellow     Clarity, UA Turbid     Specific Gravity UA 1.022     Urine pH 6.0     Leukocyte Esterase, UA Large     Nitrite, UA Negative     Protein, UR 100     Glucose, UA >=500     Ketones UA 5     Urobilinogen, UA Normal mg/dL      Bilirubin, UA Negative     Blood, UA Moderate     RBC, UA TNTC /hpf      WBC, UA TNTC /hpf      Squamous Epithelial Cells, Urine 0-5 /hpf      Urine Mucus Present    Narrative:       Replace urinary catheter prior to obtaining the urine culture  if it has  been in place for greater than or equal to 14  days:->N/A No Foley  Indications for U/A Reflex to Micro - Reflex to  Culture:->Suprapubic Pain/Tenderness or Dysuria    GFR [[id number]] Collected:  08/14/18 1014     Updated:  08/14/18 1057     EGFR 24.2    Magnesium [[id number]]  (Abnormal) Collected:  08/14/18 1014    Specimen:  Blood Updated:  08/14/18 1057     Magnesium 2.8 mg/dL     Phosphorus [[id number]] Collected:  08/14/18 1014    Specimen:  Blood Updated:  08/14/18 1057     Phosphorus 2.5 mg/dL     Comprehensive metabolic panel [[id number]]  (Abnormal) Collected:  08/14/18 1014    Specimen:  Blood Updated:  08/14/18 1057     Glucose 242 mg/dL      BUN 53.6 mg/dL      Creatinine 2.7 mg/dL      Sodium 644 mEq/L      Potassium 3.8 mEq/L      Chloride 110 mEq/L      CO2 26 mEq/L      Calcium 8.8 mg/dL      Protein, Total 6.1 g/dL      Albumin 2.9 g/dL      AST (SGOT) 7 U/L      ALT 7 U/L      Alkaline Phosphatase 148 U/L      Bilirubin, Total 0.3 mg/dL      Globulin 3.2 g/dL      Albumin/Globulin Ratio 0.9     Anion Gap 10.0    Creatine Kinase (CK) [[id number]] Collected:  08/14/18 1014    Specimen:  Blood Updated:  08/14/18 1057     Creatine Kinase (CK) 54 U/L     Lactic acid, plasma [[id number]]  (Abnormal) Collected:  08/14/18 1014    Specimen:  Blood Updated:  08/14/18 1047     Lactic acid 3.2 mmol/L     Glucose Whole Blood - POCT [[id number]]  (Abnormal)  Collected:  08/14/18 1011     Updated:  08/14/18 1031     POCT - Glucose Whole blood 225 mg/dL     Glucose Whole Blood - POCT [[id number]]  (Abnormal) Collected:  08/14/18 0904     Updated:  08/14/18 0922     POCT - Glucose Whole blood 336 mg/dL     Glucose Whole Blood - POCT [[id number]]  (Abnormal) Collected:  08/14/18 0834     Updated:  08/14/18 0837     POCT - Glucose Whole blood 355 mg/dL     MRSA culture - Nares (If not done in triage) [[id number]] Collected:  08/14/18 0326    Specimen:  Body Fluid from Nares Updated:  08/14/18 0813    MRSA culture - Throat (If  not done in triage) [[id number]] Collected:  08/14/18 0326    Specimen:  Body Fluid from Throat Updated:  08/14/18 0813    Glucose Whole Blood - POCT [[id number]]  (Abnormal) Collected:  08/14/18 0724     Updated:  08/14/18 0731     POCT - Glucose Whole blood 398 mg/dL     Glucose Whole Blood - POCT [[id number]]  (Abnormal) Collected:  08/14/18 0641     Updated:  08/14/18 0652     POCT - Glucose Whole blood 500 mg/dL     Glucose Whole Blood - POCT [[id number]]  (Abnormal) Collected:  08/14/18 0532     Updated:  08/14/18 0652     POCT - Glucose Whole blood >600 mg/dL     Basic Metabolic Panel [[id number]]  (Abnormal) Collected:  08/14/18 0539    Specimen:  Blood Updated:  08/14/18 0611     Glucose 591 mg/dL      BUN 75.6 mg/dL      Creatinine 3.2 mg/dL      Calcium 8.6 mg/dL      Sodium 433 mEq/L      Potassium 3.8 mEq/L      Chloride 103 mEq/L      CO2 21 mEq/L      Anion Gap 18.0    GFR [[id number]] Collected:  08/14/18 0539     Updated:  08/14/18 0611     EGFR 19.9    Comprehensive metabolic panel [[id number]]  (Abnormal) Collected:  08/14/18 0429    Specimen:  Blood Updated:  08/14/18 0520     Glucose 693 mg/dL      BUN 09.3 mg/dL      Creatinine 3.3 mg/dL      Sodium 235 mEq/L      Potassium 3.8 mEq/L      Chloride 101 mEq/L      CO2 20 mEq/L      Calcium 8.4 mg/dL      Protein, Total 6.0 g/dL      Albumin 2.9 g/dL      AST (SGOT) 6 U/L      ALT 7 U/L      Alkaline Phosphatase 157 U/L      Bilirubin, Total 0.3 mg/dL      Globulin 3.1 g/dL      Albumin/Globulin Ratio 0.9     Anion Gap 20.0    Magnesium [[id number]]  (Abnormal) Collected:  08/14/18 0429    Specimen:  Blood Updated:  08/14/18 0520     Magnesium 3.0 mg/dL     Phosphorus [[id number]] Collected:  08/14/18 0429    Specimen:  Blood Updated:  08/14/18 0520     Phosphorus 3.8 mg/dL     GFR [[id number]] Collected:  08/14/18 0429     Updated:  08/14/18 0520     EGFR 19.2    Lactic acid, plasma - Sepsis specimen #2, follow-up to positive initial lactate value.  [[id number]]  (Abnormal) Collected:  08/14/18 0429    Specimen:  Blood Updated:  08/14/18 0448     Lactic acid 2.5 mmol/L     PT/APTT [[id number]] Collected:  08/14/18 0429     Updated:  08/14/18 0447     PT 14.4 sec      PT INR 1.1     PTT 24 sec     ADULT Urinalysis Reflex to Microscopic Exam - Reflex to Culture [[id number]]  (Abnormal) Collected:  08/14/18 0326     Updated:  08/14/18 0407     Urine Type Urine, Clean Ca     Color, UA Straw     Clarity, UA Sl Cloudy     Specific Gravity UA 1.024     Urine pH 6.0     Leukocyte Esterase, UA Negative     Nitrite, UA Negative     Protein, UR Negative     Glucose, UA >=500     Ketones UA 20     Urobilinogen, UA Normal mg/dL      Bilirubin, UA Negative     Blood, UA Small     RBC, UA 0-2 /hpf      WBC, UA 0-5 /hpf      Hyaline Casts, UA 0-3 /lpf     Narrative:       Replace urinary catheter prior to obtaining the urine culture  if it has been in place for greater than or equal to 14  days:->No  Indications for U/A Reflex to Micro - Reflex to  Culture:->Acute neurologic change without other likely cause    Cell MorpHology [[id number]] Collected:  08/13/18 2247     Updated:  08/14/18 0242     Cell Morphology: Normal     Platelet Estimate Normal    Narrative:       Replace urinary catheter prior to obtaining the urine culture  if it has been in place for greater than or equal to 14  days:->N/A No Foley  Indications for U/A Reflex to Micro - Reflex to  Culture:->Suprapubic Pain/Tenderness or Dysuria    CBC with differential [[id number]]  (Abnormal) Collected:  08/13/18 2247    Specimen:  Blood Updated:  08/14/18 0242     WBC 17.34 x10 3/uL      Hgb 15.8 g/dL      Hematocrit 24.4 %      Platelets 272 x10 3/uL      RBC 5.45 x10 6/uL      MCV 90.6 fL      MCH 29.0 pg      MCHC 32.0 g/dL      RDW 13 %      MPV 14.0 fL      Nucleated RBC 0.0 /100 WBC      Absolute NRBC 0.00 x10 3/uL     Narrative:       Replace urinary catheter prior to obtaining the urine culture  if it has been in  place for greater than or equal to 14  days:->N/A No Foley  Indications for U/A Reflex to Micro - Reflex to  Culture:->Suprapubic Pain/Tenderness or Dysuria    Manual Differential [[id number]]  (Abnormal) Collected:  08/13/18 2247     Updated:  08/14/18 0242     Segmented Neutrophils 85 %  Band Neutrophils 7 %      Lymphocytes Manual 5 %      Monocytes Manual 3 %      Eosinophils Manual 0 %      Basophils Manual 0 %      Abs Seg Manual 14.74 x10 3/uL      Bands Absolute 1.21 x10 3/uL      Absolute Lymph Manual 0.87 x10 3/uL      Monocytes Absolute 0.52 x10 3/uL      Absolute Eos Manual 0.00 x10 3/uL      Absolute Baso Manual 0.00 x10 3/uL     Narrative:       Replace urinary catheter prior to obtaining the urine culture  if it has been in place for greater than or equal to 14  days:->N/A No Foley  Indications for U/A Reflex to Micro - Reflex to  Culture:->Suprapubic Pain/Tenderness or Dysuria    Basic Metabolic Panel (BMP) [[id number]]  (Abnormal) Collected:  08/14/18 0147    Specimen:  Blood Updated:  08/14/18 0231     Glucose 932 mg/dL      BUN 409.8 mg/dL      Creatinine 3.6 mg/dL      Calcium 8.3 mg/dL      Sodium 119 mEq/L      Potassium 4.6 mEq/L      Chloride 94 mEq/L      CO2 14 mEq/L      Anion Gap 28.0    GFR [[id number]] Collected:  08/14/18 0147     Updated:  08/14/18 0213     EGFR 17.4    Glucose Whole Blood - POCT [[id number]]  (Abnormal) Collected:  08/14/18 0133     Updated:  08/14/18 0137     POCT - Glucose Whole blood >600 mg/dL     Lactic acid, plasma [[id number]]  (Abnormal) Collected:  08/14/18 0105    Specimen:  Blood Updated:  08/14/18 0125     Lactic acid 2.9 mmol/L     Comprehensive metabolic panel [[id number]]  (Abnormal) Collected:  08/13/18 2247    Specimen:  Blood Updated:  08/14/18 0030     Glucose 968 mg/dL      BUN 725.3 mg/dL      Creatinine 3.9 mg/dL      Sodium 664 mEq/L      Potassium 4.9 mEq/L      Chloride 84 mEq/L      CO2 12 mEq/L      Calcium 9.7 mg/dL      Protein, Total 7.5  g/dL      Albumin 3.6 g/dL      AST (SGOT) 7 U/L      ALT 7 U/L      Alkaline Phosphatase 195 U/L      Bilirubin, Total 0.4 mg/dL      Globulin 3.9 g/dL      Albumin/Globulin Ratio 0.9     Anion Gap 37.0    Narrative:       Replace urinary catheter prior to obtaining the urine culture  if it has been in place for greater than or equal to 14  days:->N/A No Foley  Indications for U/A Reflex to Micro - Reflex to  Culture:->Suprapubic Pain/Tenderness or Dysuria    Lactic acid, plasma [[id number]]  (Abnormal) Collected:  08/13/18 2247    Specimen:  Blood Updated:  08/13/18 2353     Lactic acid 6.2 mmol/L     Narrative:       Cancel second  order for specimen if the initial level is less  than 2.30mEq/L    GFR [[id number]] Collected:  08/13/18 2247     Updated:  08/13/18 2335     EGFR 15.8    Narrative:       Replace urinary catheter prior to obtaining the urine culture  if it has been in place for greater than or equal to 14  days:->N/A No Foley  Indications for U/A Reflex to Micro - Reflex to  Culture:->Suprapubic Pain/Tenderness or Dysuria    Glucose Whole Blood - POCT [[id number]]  (Abnormal) Collected:  08/13/18 2233     Updated:  08/13/18 2316     POCT - Glucose Whole blood >600 mg/dL           Procedures performed:       Treatment Team:   Attending Provider: Durward Fortes, MD    Disposition:   Disposition: Short Term Hospital    Condition at Discharge:   fair     Follow up Recommendations for Receiving Provider   #Neuro: metabolic encephalopathy, improved alertness as DKA improved. Still has some residual confusion. Of note, patient had prior stroke. Of note patient and his wife are both very poor historians.     #pulm: aspiration, increase oxygenation.  Doing ok on nasal cannula.     #cardiac: h/o NSTEMI 01/2018, with CABG 2 months prior. Ischemic cardiomyopathy, EF 20-25%. HLD    #DM, uncertain if pt takes insulin, (pt's wife does not know and pt too confused), tried to obtain information from Dr. Pearlie Oyster,  [facility] system is down at this time.  Pt went into DKA. Now anion gap is closed. Transitioned to lantus and SSI    #nutrition: npo, patient had aspirated.     # renal: acute renal failure, improved creatinine after iv hydration.  Hyperkalemia, repleted.      #ID: GNR bacteremia, +UTI, changed ceftriaxone to Zosyn.  Currently hemodynamically stable.      #patient stable to transfer to floor for continue medical management of diabetes and his other co-morbidities.     Unresulted Labs     Procedure . . . Date/Time    Basic Metabolic Panel [[id number]] Collected:  08/14/18 1549    Specimen:  Blood Updated:  08/14/18 1557    MRSA culture - Nares (If not done in triage) [[id number]] Collected:  08/14/18 0326    Specimen:  Body Fluid from Nares Updated:  08/14/18 0813    MRSA culture - Throat (If not done in triage) [[id number]] Collected:  08/14/18 0326    Specimen:  Body Fluid from Throat Updated:  08/14/18 0813          Discharge Instructions:   per inpatient medication list     Discharge Medication List         midodrine 10 MG tablet  Dose:  10 mg  Commonly known as:  PROAMATINE  Take 10 mg by mouth 3 (three) times daily     mirtazapine 15 MG disintegrating tablet  Dose:  15 mg  Commonly known as:  REMERON SOL-TAB  Take 15 mg by mouth nightly            Signed by: Lawernce Ion, MD

## 2018-08-14 NOTE — Plan of Care (Signed)
Problem: Safety  Goal: Patient will be free from injury during hospitalization  Outcome: Progressing  Flowsheets (Taken 08/14/2018 [phone])  Patient will be free from injury during hospitalization : Assess patient's risk for falls and implement fall prevention plan of care per policy; Provide and maintain safe environment; Use appropriate transfer methods; Include patient/ family/ care giver in decisions related to safety; Hourly rounding; Assess for patients risk for elopement and implement Elopement Risk Plan per policy     Problem: Moderate/High Fall Risk Score >5  Goal: Patient will remain free of falls  Outcome: Progressing  Flowsheets (Taken 08/14/2018 0616)  High (Greater than 13): HIGH-Apply yellow "Fall Risk" arm band     Problem: Compromised Tissue integrity  Goal: Nutritional status is improving  Outcome: Progressing  Flowsheets (Taken 08/14/2018 0616)  Nutritional status is improving: Assist patient with eating; Allow adequate time for meals; Include patient/patient care companion in decisions related to nutrition     Problem: Diabetes: Glucose Imbalance  Goal: Blood glucose stable at established goal  Outcome: Progressing  Flowsheets (Taken 08/14/2018 0616)  Blood glucose stable at established goal : Monitor lab values; Follow fluid restrictions/IV/PO parameters; Assess for hypoglycemia /hyperglycemia; Monitor/assess vital signs; Ensure adequate hydration     Patient admitted from ER.  Patient with insulin gtt titrated per MD order.  Patient's wife at bedside.

## 2018-08-15 LAB — MRSA CULTURE
Culture MRSA Surveillance: NEGATIVE
Culture MRSA Surveillance: NEGATIVE

## 2018-08-15 NOTE — Progress Notes (Signed)
08/14/18 1610   Discharge Disposition   Patient preference/choice provided? Yes   Physical Discharge Disposition Another Acute Hospital   [facility] [place] [facility]   Mode of Transportation Ambulance  [facility] to arrange ambulance transport )   Pick up time not sure   Patient/Family/POA notified of transfer plan Yes  (MD talked to the family )   Patient agreeable to discharge plan/expected d/c date? Yes   Family/POA agreeable to discharge plan/expected d/c date? Yes   Bedside nurse notified of transport plan? Yes   Hard copy of narcotic RX sent with patient? N/A   Hard copy of DNR/Advance Directive sent with patient? N/A   IV antibiotics post discharge? N/A   Wound care post discharge? N/A   Medicare Checklist   Is this a Medicare patient? Yes   Patient received 1st IMM Letter? n/a

## 2018-11-10 ENCOUNTER — Emergency Department: Payer: PRIVATE HEALTH INSURANCE

## 2018-11-10 ENCOUNTER — Emergency Department
Admission: EM | Admit: 2018-11-10 | Discharge: 2018-11-10 | Disposition: A | Discharge status: Home / Self Care | Payer: PRIVATE HEALTH INSURANCE | Attending: Emergency Medicine | Admitting: Emergency Medicine

## 2018-11-10 DIAGNOSIS — I10 Essential (primary) hypertension: Secondary | ICD-10-CM | POA: Insufficient documentation

## 2018-11-10 DIAGNOSIS — J189 Pneumonia, unspecified organism: Secondary | ICD-10-CM | POA: Insufficient documentation

## 2018-11-10 DIAGNOSIS — R112 Nausea with vomiting, unspecified: Secondary | ICD-10-CM

## 2018-11-10 DIAGNOSIS — Z7984 Long term (current) use of oral hypoglycemic drugs: Secondary | ICD-10-CM | POA: Insufficient documentation

## 2018-11-10 DIAGNOSIS — E7800 Pure hypercholesterolemia, unspecified: Secondary | ICD-10-CM | POA: Insufficient documentation

## 2018-11-10 DIAGNOSIS — D72829 Elevated white blood cell count, unspecified: Secondary | ICD-10-CM

## 2018-11-10 DIAGNOSIS — A419 Sepsis, unspecified organism: Secondary | ICD-10-CM | POA: Insufficient documentation

## 2018-11-10 DIAGNOSIS — E119 Type 2 diabetes mellitus without complications: Secondary | ICD-10-CM | POA: Insufficient documentation

## 2018-11-10 DIAGNOSIS — R7989 Other specified abnormal findings of blood chemistry: Secondary | ICD-10-CM

## 2018-11-10 DIAGNOSIS — Y95 Nosocomial condition: Secondary | ICD-10-CM | POA: Insufficient documentation

## 2018-11-10 LAB — ECG 12-LEAD
Atrial Rate: 122 {beats}/min
Q-T Interval: 340 ms
QRS Duration: 96 ms
QTC Calculation (Bezet): 503 ms
R Axis: -72 degrees
T Axis: 79 degrees
Ventricular Rate: 132 {beats}/min

## 2018-11-10 LAB — CBC AND DIFFERENTIAL
Absolute NRBC: 0 10*3/uL (ref 0.00–0.00)
Basophils Absolute Automated: 0.08 10*3/uL (ref 0.00–0.08)
Basophils Automated: 0.3 %
Eosinophils Absolute Automated: 0.24 10*3/uL (ref 0.00–0.44)
Eosinophils Automated: 1 %
Hematocrit: 36.1 % — ABNORMAL LOW (ref 37.6–49.6)
Hgb: 11.1 g/dL — ABNORMAL LOW (ref 12.5–17.1)
Immature Granulocytes Absolute: 0.13 10*3/uL — ABNORMAL HIGH (ref 0.00–0.07)
Immature Granulocytes: 0.5 %
Lymphocytes Absolute Automated: 1.76 10*3/uL (ref 0.42–3.22)
Lymphocytes Automated: 7.1 %
MCH: 27.9 pg (ref 25.1–33.5)
MCHC: 30.7 g/dL — ABNORMAL LOW (ref 31.5–35.8)
MCV: 90.7 fL (ref 78.0–96.0)
MPV: 11.5 fL (ref 8.9–12.5)
Monocytes Absolute Automated: 1.07 10*3/uL — ABNORMAL HIGH (ref 0.21–0.85)
Monocytes: 4.3 %
Neutrophils Absolute: 21.53 10*3/uL — ABNORMAL HIGH (ref 1.10–6.33)
Neutrophils: 86.8 %
Nucleated RBC: 0 /100 WBC (ref 0.0–0.0)
Platelets: 418 10*3/uL — ABNORMAL HIGH (ref 142–346)
RBC: 3.98 10*6/uL — ABNORMAL LOW (ref 4.20–5.90)
RDW: 16 % — ABNORMAL HIGH (ref 11–15)
WBC: 24.81 10*3/uL — ABNORMAL HIGH (ref 3.10–9.50)

## 2018-11-10 LAB — COMPREHENSIVE METABOLIC PANEL
ALT: 28 U/L (ref 0–55)
AST (SGOT): 21 U/L (ref 5–34)
Albumin/Globulin Ratio: 0.8 — ABNORMAL LOW (ref 0.9–2.2)
Albumin: 3.7 g/dL (ref 3.5–5.0)
Alkaline Phosphatase: 141 U/L — ABNORMAL HIGH (ref 38–106)
Anion Gap: 16 — ABNORMAL HIGH (ref 5.0–15.0)
BUN: 21 mg/dL (ref 9.0–28.0)
Bilirubin, Total: 0.2 mg/dL (ref 0.2–1.2)
CO2: 23 mEq/L (ref 22–29)
Calcium: 10.4 mg/dL (ref 8.5–10.5)
Chloride: 104 mEq/L (ref 100–111)
Creatinine: 1.1 mg/dL (ref 0.7–1.3)
Globulin: 4.6 g/dL — ABNORMAL HIGH (ref 2.0–3.6)
Glucose: 40 mg/dL — CL (ref 70–100)
Potassium: 4.6 mEq/L (ref 3.5–5.1)
Protein, Total: 8.3 g/dL (ref 6.0–8.3)
Sodium: 143 mEq/L (ref 136–145)

## 2018-11-10 LAB — LACTIC ACID, PLASMA
Lactic Acid: 3.8 mmol/L — ABNORMAL HIGH (ref 0.2–2.0)
Lactic Acid: 2.7 mmol/L — ABNORMAL HIGH (ref 0.2–2.0)

## 2018-11-10 LAB — BASIC METABOLIC PANEL
Anion Gap: 7 (ref 5.0–15.0)
BUN: 19 mg/dL (ref 9.0–28.0)
CO2: 21 mEq/L — ABNORMAL LOW (ref 22–29)
Calcium: 7.7 mg/dL — ABNORMAL LOW (ref 8.5–10.5)
Chloride: 113 mEq/L — ABNORMAL HIGH (ref 100–111)
Creatinine: 0.9 mg/dL (ref 0.7–1.3)
Glucose: 101 mg/dL — ABNORMAL HIGH (ref 70–100)
Potassium: 4.1 mEq/L (ref 3.5–5.1)
Sodium: 141 mEq/L (ref 136–145)

## 2018-11-10 LAB — LIPASE: Lipase: 38 U/L (ref 8–78)

## 2018-11-10 LAB — ETHANOL: Alcohol: NOT DETECTED mg/dL

## 2018-11-10 LAB — GFR
EGFR: >60
EGFR: >60

## 2018-11-10 LAB — PROCALCITONIN: Procalcitonin: 0.06 (ref 0.00–0.10)

## 2018-11-10 LAB — B-TYPE NATRIURETIC PEPTIDE: B-Natriuretic Peptide: 294 pg/mL — ABNORMAL HIGH (ref 0–100)

## 2018-11-10 LAB — GLUCOSE WHOLE BLOOD - POCT: Whole Blood Glucose POCT: 91 mg/dL (ref 70–100)

## 2018-11-10 LAB — TROPONIN I: Troponin I: 0.03 ng/mL (ref 0.00–0.05)

## 2018-11-10 MED ORDER — SODIUM CHLORIDE 0.9 % IV BOLUS
30.00 mL/kg | Freq: Once | INTRAVENOUS | Status: AC
Start: 2018-11-10 — End: 2018-11-10
  Administered 2018-11-10: 03:00:00 1950 mL via INTRAVENOUS

## 2018-11-10 MED ORDER — SODIUM CHLORIDE 0.9 % IV BOLUS
1000.00 mL | Freq: Once | INTRAVENOUS | Status: AC
Start: 2018-11-10 — End: 2018-11-10
  Administered 2018-11-10: 03:00:00 1000 mL via INTRAVENOUS

## 2018-11-10 MED ORDER — SODIUM CHLORIDE 0.9 % IV SOLN
INTRAVENOUS | Status: DC
Start: 2018-11-10 — End: 2018-11-10

## 2018-11-10 MED ORDER — SODIUM CHLORIDE 0.9 % IV BOLUS
1000.00 mL | Freq: Once | INTRAVENOUS | Status: AC
Start: 2018-11-10 — End: 2018-11-10
  Administered 2018-11-10: 07:00:00 1000 mL via INTRAVENOUS

## 2018-11-10 MED ORDER — SODIUM CHLORIDE 0.9 % IV BOLUS
1000.00 mL | Freq: Once | INTRAVENOUS | Status: AC
Start: 2018-11-10 — End: 2018-11-10
  Administered 2018-11-10: 06:00:00 1000 mL via INTRAVENOUS

## 2018-11-10 MED ORDER — DEXTROSE 50 % IV SOLN
50.00 mL | Freq: Once | INTRAVENOUS | Status: AC
Start: 2018-11-10 — End: 2018-11-10
  Administered 2018-11-10: 07:00:00 50 mL via INTRAVENOUS
  Filled 2018-11-10: qty 50

## 2018-11-10 MED ORDER — DEXTROSE 50 % IV SOLN
50.00 mL | Freq: Once | INTRAVENOUS | Status: AC
Start: 2018-11-10 — End: 2018-11-10
  Administered 2018-11-10: 03:00:00 50 mL via INTRAVENOUS
  Filled 2018-11-10: qty 50

## 2018-11-10 MED ORDER — ONDANSETRON HCL 4 MG/2ML IJ SOLN
4.00 mg | Freq: Once | INTRAMUSCULAR | Status: AC
Start: 2018-11-10 — End: 2018-11-10
  Administered 2018-11-10: 03:00:00 4 mg via INTRAVENOUS
  Filled 2018-11-10: qty 2

## 2018-11-10 MED ORDER — LEVOFLOXACIN IN D5W 750 MG/150ML IV SOLN
750.00 mg | Freq: Once | INTRAVENOUS | Status: AC
Start: 2018-11-10 — End: 2018-11-10
  Administered 2018-11-10: 04:00:00 750 mg via INTRAVENOUS
  Filled 2018-11-10: qty 150

## 2018-11-10 MED ORDER — PIPERACILLIN SOD-TAZOBACTAM SO 4.5 (4-0.5) G IV SOLR
4.50 g | Freq: Once | INTRAVENOUS | Status: AC
Start: 2018-11-10 — End: 2018-11-10
  Administered 2018-11-10: 05:00:00 4.5 g via INTRAVENOUS
  Filled 2018-11-10: qty 20

## 2018-11-10 MED ORDER — ACETAMINOPHEN 500 MG PO TABS
1000.00 mg | ORAL_TABLET | Freq: Once | ORAL | Status: AC
Start: 2018-11-10 — End: 2018-11-10
  Administered 2018-11-10: 05:00:00 1000 mg via ORAL
  Filled 2018-11-10: qty 2

## 2018-11-10 NOTE — ED Triage Notes (Signed)
Pt BIBA from home, wife called EMS for profuse N/V/D.  Pt was [facility] for [facility] hypot[facility], pt reports being hospitalized multiple times over the past 2 months.  Hx of DMII, EMS was called to residence earlier tonight for hypoglycemia (52), but pt refused transport at that time.

## 2018-11-10 NOTE — ED Provider Notes (Signed)
Physician/Midlevel provider first contact with patient: 11/10/18 0231       EMERGENCY DEPARTMENT HISTORY AND PHYSICAL EXAM    Patient Name: Keith Gates, Keith Gates, 63 y.o., male  ED Provider: Louis Matte, M.D., FACEP    History of Presenting Illness:     Chief Complaint / History/ Onset/Duration / Quality / Severity / Aggravating Factors/Alleviating Factors/ Associated Symptoms  Narrative/Additional Historical Findings:Noal Rico Massar is a 63 y.o. male  With a history of diabetes, hypercholesterolemia, hypertension, prior stroke, C. Difficile colitis who is brought here by ambulance for ongoing nausea, vomiting and diarrhea tonight, ongoing cough.  Admission was just released from [facility] days ago after admission for orthostatic hypotension, pneumonia.  EMS had been to his house earlier in the night for hypoglycemia with a blood sugar of 52 and at that point.  Patient had refused transport to the hospital.  Admission.  Denies any chest pain, shortness of breath, abdominal pain or fever. On arrival to the ED he complains of chills and weakness.     Nursing notes from this date of service were reviewed.    Past Medical History:     Past Medical History:   Diagnosis Date    Diabetes mellitus     Elevated cholesterol     Hypertension     Stroke        Past Surgical History:   History reviewed. No pertinent surgical history.    Family History:   History reviewed. No pertinent family history.    Social History:     Social History     Socioeconomic History    Marital status: Married     Spouse name: Not on file    Number of children: Not on file    Years of education: Not on file    Highest education level: Not on file   Occupational History    Not on file   Social Needs    Financial resource strain: Not on file    Food insecurity:     Worry: Not on file     Inability: Not on file    Transportation needs:     Medical: Not on file     Non-medical: Not on file   Tobacco Use    Smoking  status: Former Smoker     Years: 30.00     Last attempt to quit: 10/17/2002     Years since quitting: 16.0    Smokeless tobacco: Never Used   Substance and Sexual Activity    Alcohol use: Yes    Drug use: No    Sexual activity: Not on file   Lifestyle    Physical activity:     Days per week: Not on file     Minutes per session: Not on file    Stress: Not on file   Relationships    Social connections:     Talks on phone: Not on file     Gets together: Not on file     Attends religious service: Not on file     Active member of club or organization: Not on file     Attends meetings of clubs or organizations: Not on file     Relationship status: Not on file    Intimate partner violence:     Fear of current or ex partner: Not on file     Emotionally abused: Not on file     Physically abused: Not on file     Forced sexual  activity: Not on file   Other Topics Concern    Not on file   Social History Narrative    Not on file       Allergies:   No Known Allergies    Medications:     Current Facility-Administered Medications:     0.9%  NaCl infusion, , Intravenous, Continuous, Maisie Fus, Velvet Bathe, MD    sodium chloride 0.9 % bolus 1,000 mL, 1,000 mL, Intravenous, Once, Oliver Barre, MD, Last Rate: 1,000 mL/hr at 11/10/18 0246, 1,000 mL at 11/10/18 0246    sodium chloride 0.9 % bolus 1,950 mL, 30 mL/kg, Intravenous, Once, Oliver Barre, MD    Current Outpatient Medications:     atorvastatin (LIPITOR) 40 MG tablet, Take 40 mg by mouth daily, Disp: , Rfl:     metFORMIN (GLUCOPHAGE) 1000 MG tablet, Take 1,000 mg by mouth 2 (two) times daily with meals, Disp: , Rfl:     midodrine (PROAMATINE) 5 MG tablet, Take 5 mg by mouth 2 (two) times daily with meals, Disp: , Rfl:     SITagliptin (JANUVIA) 100 MG tablet, Take 100 mg by mouth daily, Disp: , Rfl:     Review of Systems:     Review of Systems   Constitutional: Positive for chills and fatigue. Negative for fever.   HENT: Positive for  congestion. Negative for ear pain and sore throat.    Eyes: Negative for pain and redness.   Respiratory: Positive for cough. Negative for shortness of breath.    Cardiovascular: Negative for chest pain and palpitations.   Gastrointestinal: Positive for diarrhea, nausea and vomiting. Negative for abdominal pain.   Genitourinary: Negative for dysuria and frequency.   Musculoskeletal: Negative for back pain, myalgias and neck pain.   Skin: Negative for rash.   Neurological: Positive for weakness. Negative for dizziness and headaches.   Psychiatric/Behavioral: Negative for hallucinations and suicidal ideas.   All other systems reviewed and are negative.    Physical Exam:     ED Triage Vitals [11/10/18 0235]   Enc Vitals Group      BP 135/79      Heart Rate (!) 131      Resp Rate 20      Temp 99 F (37.2 C)      Temp Source Oral      SpO2 96 %      Weight 65 kg      Height       Head Circumference       Peak Flow       Pain Score       Pain Loc       Pain Edu?       Excl. in GC?      Physical Exam   Constitutional: He is oriented to person, place, and time. He appears well-nourished. He appears cachectic. He has a sickly appearance. He appears ill.   HENT:   Head: Normocephalic and atraumatic.   Right Ear: Tympanic membrane normal.   Left Ear: Tympanic membrane normal.   Eyes: Pupils are equal, round, and reactive to light.   Neck: Normal range of motion. Neck supple.   Cardiovascular: Normal rate and regular rhythm.   Pulmonary/Chest: Effort normal. No accessory muscle usage. No tachypnea. No respiratory distress. He has decreased breath sounds. He has rales in the left upper field and the left middle field.   Abdominal: Soft. Bowel sounds are normal. There is no abdominal tenderness. There is no CVA  tenderness.   Musculoskeletal: Normal range of motion.   Neurological: He is alert and oriented to person, place, and time. He has normal strength. No cranial nerve deficit or sensory deficit. GCS eye subscore is 4. GCS  verbal subscore is 5. GCS motor subscore is 6.   Skin: Skin is warm. He is not diaphoretic. There is pallor.   Psychiatric: He has a normal mood and affect. His behavior is normal.     Labs:     Labs Reviewed   CBC AND DIFFERENTIAL - Abnormal; Notable for the following components:       Result Value    WBC 24.81 (*)     Hgb 11.1 (*)     Hematocrit 36.1 (*)     Platelets 418 (*)     RBC 3.98 (*)     MCHC 30.7 (*)     RDW 16 (*)     Neutrophils Absolute 21.53 (*)     Abs Mono Automated 1.07 (*)     Absolute Immature Granulocyte 0.13 (*)     All other components within normal limits   COMPREHENSIVE METABOLIC PANEL - Abnormal; Notable for the following components:    Glucose 40 (*)     Alkaline Phosphatase 141 (*)     Globulin 4.6 (*)     Albumin/Globulin Ratio 0.8 (*)     Anion Gap 16.0 (*)     All other components within normal limits   LACTIC ACID, PLASMA - Abnormal; Notable for the following components:    Lactic acid 3.8 (*)     All other components within normal limits    Narrative:     Cancel second specimen if the initial lactate level is less  than 2.0 mEq/L.   LACTIC ACID, PLASMA - Abnormal; Notable for the following components:    Lactic acid 2.7 (*)     All other components within normal limits    Narrative:     Cancel second specimen if the initial lactate level is less  than 2.0 mEq/L.   B-TYPE NATRIURETIC PEPTIDE - Abnormal; Notable for the following components:    B-Natriuretic Peptide 294 (*)     All other components within normal limits   RAPID INFLUENZA A/B ANTIGENS    Narrative:     ORDER#: [id number]                                    ORDERED BY: Shanta Dorvil  SOURCE: Nasal Aspirate                               COLLECTED:  11/10/18 02:43  ANTIBIOTICS AT COLL.:                                RECEIVED :  11/10/18 02:45  Influenza Rapid Antigen A&B                FINAL       11/10/18 03:11  11/10/18   Negative for Influenza A and B             Reference Range: Negative     URINE CULTURE   CULTURE  BLOOD AEROBIC AND ANAEROBIC    Narrative:     1 BLUE+1 PURPLE   CULTURE BLOOD AEROBIC AND ANAEROBIC  Narrative:     1 BLUE+1 PURPLE   CLOSTRIDIUM DIFFICILE TOXIN B PCR   ETHANOL   LIPASE   GFR   TROPONIN I   PROCALCITONIN         Rads:     Radiology Results (24 Hour)     Procedure Component Value Units Date/Time    XR Chest AP Portable [[id number]] Collected:  11/10/18 0332    Order Status:  Completed Updated:  11/10/18 0339    Narrative:       Reason for exam: 63 year old male with cough.    FINDINGS:  Portable AP view. 4098.  Subtle infiltrate is suggested in the left lung especially in the left  lower chest. The right lung is clear. The heart is normal size with  previous midline sternotomy. There is no pneumothorax.      Impression:        Subtle left lung pneumonia suggested. Follow-up advised.    Wilmon Pali, MD   11/10/2018 3:34 AM          MDM and ED Course   Louis Matte, M.D., FACEP is the primary attending for this patient and has obtained and performed the history, PE, and medical decision making for this patient.    MDM:    DDX to include but not exhaustive: Sepsis, pneumonia, dehydration, colitis with N/V/D, electrolyte abnomalities, dehydration, UTI  Plan:  Sepsis protocol, IV fluids, labs, CXR, IV atb, UA, re-eval    Patient Vitals for the past 24 hrs:   BP Temp Temp src Pulse Resp SpO2 Weight   11/10/18 0600 96/60 99.2 F (37.3 C) Oral (!) 111 22 93 %    11/10/18 0530 94/62   (!) 117 (!) 25 95 %    11/10/18 0445 95/68 (!) 102.2 F (39 C)  (!) 121 18 95 %    11/10/18 0235 135/79 99 F (37.2 C) Oral (!) 131 20 96 % 65 kg       EKG interpreted by ED physician    Rate: Tachycardic  Rhythm: sinus tachycardia  Axis: Left  ST Segments: Normal ST segments  T waves: Normal T Waves  Conduction: No blocks  Impression: Non-specific EKG  No change from previous EKG.01/22/18    3:56 AM Discussed with Dr. Crissie Sickles at [facility], and he will arrange admission to [facility]    4:34 AM Rediscussed with  Dr.  Crissie Sickles.  Pt being admitted to [facility], Dr. Quentin Ore accepting physician.  Pt.  And wife aware of sepsis and transfer to [facility].  Pt now febrile to 102 and BP 95/65.  Getting IV sepsis boluses and maintenance.    Critical Care  Performed by: Oliver Barre, MD  Authorized by: Oliver Barre, MD     Critical care provider statement:     Critical care time (minutes):  55    Critical care was necessary to treat or prevent imminent or life-threatening deterioration of the following conditions:  Sepsis    Critical care was time spent personally by me on the following activities:  Blood draw for specimens, development of treatment plan with patient or surrogate, discussions with consultants, discussions with primary provider, evaluation of patient's response to treatment, examination of patient, review of old charts, re-evaluation of patient's condition, pulse oximetry, ordering and review of laboratory studies, ordering and review of radiographic studies and ordering and performing treatments and interventions        Nursing Notes: Reviewed and utilized available nursing notes.  Medical  Records Reviewed: Reviewed available past medical records.  Counseling: The emergency provider has spoken with the patient and discussed today?s findings, in addition to providing specific details for the plan of care.  Questions are answered and there is agreement with the plan.          Assessment/Plan:   Results and instructions reviewed at the bedside with patient and family.    Clinical Impression  Final diagnoses:   HCAP (healthcare-associated pneumonia)   Nausea vomiting and diarrhea   Sepsis due to pneumonia   Leukocytosis, unspecified type   Elevated lactic acid level       Disposition  ED Disposition     ED Disposition Condition Date/Time Comment    Transfer to Another Facility  Sat Nov 10, 2018  3:58 AM [name] should be transferred out to [facility].          Prescriptions  New Prescriptions    No medications  on file       Amount and/or Complexity of Data Reviewed  Clinical lab tests: ordered and reviewed  Tests in the radiology section of CPT: ordered and reviewed  Review and summarize past medical records: yes        Signed by: Oliver Barre, MD, FACEP  [facility]  [facility]    This note was generated by the Epic EMR/DRAGON speech recognition dictation system and may contain errors not intended by the user.  Random grammatical errors, pronoun errors and omissions may be a consequences of this technology due to software limitations.  Not all errors are caught or corrected.  If there are questions or concerns about the contents of this note, they should be addressed directly to the author of the note for clarification.             Oliver Barre, MD  11/12/18 [phone]

## 2018-11-10 NOTE — EDIE (Signed)
COLLECTIVE?NOTIFICATION?11/10/2018 02:29?Keith Gates, Keith Gates Gates?MRN: [id number]    Criteria Met      3 Different Facilities in 90 Days    5 ED Visits in 12 Months    Security and Safety  No recent Security Events currently on file    ED Gates Guidelines  There are currently no ED Gates Guidelines for this patient. Please check your facility's medical records system.    Flags      History of Sepsis - Patient has received a diagnosis of Sepsis from an acute or post-acute setting. Apply appropriate clinical planning practices; to learn more visit [email or web address].wolf.info/ / Attributed By: [employer] / Attributed On: 10/18/2018       Prescription Monitoring Program  040??- Narcotic Use Score  020??- Sedative Use Score  000??- Stimulant Use Score  190??- Overdose Risk Score  - All Scores range from 000-999 with 75% of the population scoring < 200 and on 1% scoring above 650  - The last digit of the narcotic, sedative, and stimulant score indicates the number of active prescriptions of that type  - Higher Use scores correlate with increased prescribers, pharmacies, mg equiv, and overlapping prescriptions  - Higher Overdose Risk Scores correlate with increased risk of unintentional overdose death   Concerning or unexpectedly high scores should prompt a review of the PMP record; this does not constitute checking PMP for prescribing purposes.      E.D. Visit Count (12 mo.)  Facility Visits   [place] [facility] 2   [facility] 2   [facility]) 1   Total 5   Note: Visits indicate total known visits.      Recent Emergency Gates Visit Summary  Date Facility [facility] Type Diagnoses or Chief Complaint   Nov 10, 2018 [facility] [place]      flu liek symptoms      Sep 20, 2018 [facility] [place]      Abnormal labs      Abnormal Lab      Aug 13, 2018 [place] [facility] [place]       sever weakness, lack of appetite      Generalized weakness      Oth diabetes mellitus with ketoacidosis without coma      Hypotension, unspecified      Acute kidney failure, unspecified      Other elevated white blood cell count      Acidosis      Dehydration      Mar 07, 2018 [facility] [place] [place] [facility]      hypotension      Dizziness      Dizziness and giddiness      Nausea with vomiting, unspecified      Dehydration      Orthostatic hypotension      Jan 22, 2018  - [place]. [place]. 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      Shortness of breath      Acute kidney failure, unspecified      Hypomagnesemia  Recent Inpatient Visit Summary  Date Facility [facility] Type Diagnoses or Chief Complaint   Aug 14, 2018 [facility]      Encounter for other general examination      Other disorders of phosphorus metabolism      Metabolic encephalopathy      Hyperosmolality and hypernatremia      Urinary tract infection, site not specified      Acute respiratory failure with hypoxia      Hypertensive heart disease with heart failure      Hyperlipidemia, unspecified      Old myocardial infarction      Personal history of other venous thrombosis and embolism      Aug 13, 2018 [facility]      Acidosis      Dehydration      Acute kidney failure, unspecified      Other elevated white blood cell count      Hypotension, unspecified      Oth diabetes mellitus with ketoacidosis without coma      Jan 22, 2018 [facility]      Oth diabetes mellitus with ketoacidosis without coma      Shortness of breath      Non-ST elevation (NSTEMI) myocardial infarction      Acute combined systolic and diastolic  (congestive) hrt fail      Hypomagnesemia      Acute kidney failure, unspecified      Acidosis          Gates Team  Provider Specialty Phone Fax Service Dates   Keith Gates Sack, MD Student in an [place] [facility] Education/Training Program [phone]  Dec 26, 2015 - Current    Keith Gates Gates (M.D.) Primary Gates   Dec 26, 2015 - Current      [facility] Portal  This patient has registered at the Keith Gates Gates   For more information visit: [email or web address]     PLEASE NOTE:     1.   Any Gates recommendations and other clinical information are provided as guidelines or for historical purposes only, and providers should exercise their own clinical judgment when providing Gates.    2.   You may only use this information for purposes of treatment, payment or health Gates operations activities, and subject to the limitations of applicable [employer] Policies.    3.   You should consult directly with the organization that provided a Gates guideline or other clinical history with any questions about additional information or accuracy or completeness of information provided.    ? 2020 [place], [employer]. - [email or web address]

## 2018-11-12 NOTE — Progress Notes (Signed)
bc : positive : gram + rods bacillus, : probably contaminant, pt septic, admitted at [facility]: [phone]. [facility] pt: recommend contact [facility] and Aeronautical engineer. Marland Kitchen

## 2018-11-14 NOTE — Progress Notes (Signed)
 Occupational Therapy Treatment Note     Patient Name: Keith Gates  Unijb'd Date: 11/14/2018    OT Summary:   OT Discharge Recommendations: Home with family or caregiver, Home OT, Home PT  OT Adaptive Equipment: Tub transfer bench  Admission summary:  63 y.o.  male who presents on day 4 of admission due to Sepsis (CMS/HCC) [A41.9], Pneumonia due to infectious organism, unspecified laterality, unspecified part of lung [J18.9]  Plan:  Therapy Rehab Potential: Good for stated goals  OT Assessment: Pt seen this PM for OTx treatment session on day 4 of admission for pneumonia and sepsis. Pt demonstrating progress towards OTx goals, meeting 2/8 goals this session. Pt completing functional transfers and short household distance ambulation at grossly supervision-level with use of rolling walker; sinkside g/h with supervision however continues to require mod assist for LB dressing and max assist for LB bathing/hygiene due to incontinence. Pt with 1 LOB this session requiring min assist possibly related to orthostatic hypotension (see activity tolerance) despite patient denial of symptoms. Will continue to benefit from skilled OTx and recommend Home PT/OTx upon discharge to maximize independence and safety in self-care tasks.    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    Patient  has a past medical history of Diabetes mellitus (CMS/HCC), HLD (hyperlipidemia)  (01/23/2018), Hypertension, and Stroke (CMS/HCC).     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    Intake:  Patient seen for OTx treatment session. Cleared by RN to participate in therapy on this date; patient agreeable to session.    Subjective/Patient Comment: Thanks for helping.   Treatment Diagnosis: Decreased activities of daily living, Impaired mobility and ADLs, Requires assistance with activities of daily living   Admission Date: 11/10/2018 Start of Service: 11/12/18   Recent Hospitalization: Yes Hospitalized From: 09/20/18 Hospitalized To: 10/07/18   Family/Caregiver Present: No  Employment Status: Retired  Type of Occupation: Tax inspector Language: English                     Treatment Provided:  Received supine in bed, agreeable to session  Supine>sit with mod I  In sitting, demo B/L sock mgmt with supervision  Sit>stand with supervision, RW  Amb to/from bathroom with supervision, RW  Transferred to/from toilet with supervision, verbal cues for RW safety and bedside commode over toilet to facil transfer  Required max assist to remove briefs over hips, pt able to remove in sitting with increased time  Completing anterior upper B/LE bathing with set-up and verbal cues at seated-level  Sit>stand with supervision, RW for total assist posterior pericare in standing at RW x60min  Amb to sink with supervision, RW yet 1 LOB requiring min assist to correct  Hand hygiene standing sinkside with supervision  Returned to supine with mod I  Verbal cuing for safety during transfers, with safe placement of hands on RW    General Appearance:  Appearance: Well Developed/Well Nourished    Precautions:  Precautions: Fall risk, Aspiration  General Assessment and Performance Skills:  Functional Mobility: Current  Weight Bearing Status: Full  Assistive Devices: Rolling walker  Assistance : Supervision(1 LOB requiring min assist  to correct)  Household Distance (FT): Yes; to/from bathroom    Bed/Mobility/Transfers  Supine to Sit: Modified independence  Sit to Supine: Modified independence  Sit to Stand: Supervision  Stand to Sit: Supervision  Bed to Chair: Supervision  Chair to bed: Supervision  Toilet Transfer: Supervision(+bedside commode to elevate surface)       Grooming: Current  Grooming Level of Assistance: Supervision  Grooming Deficit: Supervision/safety  Grooming Where Assessed: Standing sinkside  Grooming Comments: stand hand hygiene standing sinkside s/p toileting ADL    Bathing: Current  Bathing Level of Assistance: Maximal assistance  Bathing Deficit: Setup, Steadying, Verbal cueing, Supervision/safety, Perineal area, Buttocks  LE Bathing Where Assessed: (Toilet)  Bathing Comments: Pt able to complete anterior upper LE bathing with set-up in sitting; however max assist for posterior pericare as pt maintaining standing balance x62min with supervision and B/UE support on RW       LE Dressing: Current  Sock Level of Assistance: Supervision  Adult Briefs Level of Assistance: Moderate assistance  LE Dressing Deficit: Steadying, Requires assistive device for steadying, Supervision/safety, Increased time to complete  LE Dressing Where Assessed: Edge of bed(Toilet)  LE Dressing Comments: Max assist to initiate soiled briefs over hips as pt standing at RW toilet-side, in sitting on toilet, pt able to remove briefs/pad over knees/heels/feet with increased time and verbal cues; demo's B/L socks with supervision in sitting    Toileting: Current  Toileting Comments: Pt tx to/from toilet with bedside commode with supervision, however incontinent of BM    Cognition  Overall Cognitive Status: Within Functional Limits  Arousal/Alertness: Cooperative  Attention Span: Appears intact  Memory: Appears intact  Orientation Level: Oriented X4  Following Commands: Follows all commands and directions without difficulty  Safety Judgment: Good awareness of  safety precautions  Awareness of Errors: Good awareness of errors made  Deficits: Fully aware of deficits  Problem Solving: Able to problem solve independently    Pain Assessment  Pain Assessment: No/denies pain    Sensation  Light Touch: No apparent deficits       Coordination  Movements are Fluid and Coordinated: Yes  Grasp/Release: Intact    Hand Function  Gross Grasp: Functional  Coordination: Functional    Perception  Inattention/Neglect: Appears intact  Initiation: Appears intact  Motor Planning: Intact  Perseveration: Not present    Static Sitting Balance  Static Sitting-Balance Support: No upper extremity supported  Static Sitting-Level of Assistance: Independent  Static Sitting-Comment/# of Minutes: sitting EOB    Dynamic Sitting Balance  Dynamic Sitting-Balance Support: No upper extremity supported  Dynamic Sitting-Level of Assistance: Market researcher Standing-Balance Support: No upper extremity supported  Static Standing-Level of Assistance: Supervision  Static Standing-Comment/# of Minutes: hand hygiene standing sinkside    Dynamic Standing Balance  Dynamic Standing-Balance Support: Bilateral upper extremity supported  Dynamic Standing-Level of Assistance : Supervision  Dynamic Standing-Comments: amb short household distance with RW, 1 LOB requiring min assist to correct    Postural Control  Sitting Posture Comment: Forward head, rounded shoulders  Standing Posture Comment: As above, forward flexion    Activity Tolerance  Endurance: Tolerates 10 - 20 min exercise with multiple rests  Activity Tolerance Comments: Fair; pt c/o mild dizziness, positive for orthostatic hypotension:  Supine BP: 140/72  Sitting BP: 113/59  Standing  BP: 98/57  Supine BP at end of session: 142/65  RN aware    Functional Outcome Measures:     How much help from another person does the patient currently need...  Putting on and taking off regular lower body clothing?: A lot  Bathing (including  washing, rinsing, drying)?: A lot  Toileting, which includes using toilet, bedpan or urial?: A little  PUtting on and taking off regular upper body clothing?: A little  Taking care of personal grooming such as brushing teeth?: A little  Eating meals?: None  Total Raw Score: 17    Patient Education:  Relationship to Patient: Patient  Readiness to Learn: Acceptance  Learning Preference: Listening  Teaching Method: Explanation  Education Provided: POC, safety precautions  Follow-up: Reinforce content  Response to Education: [employer] Understanding  Learning Barriers: None    Short Term Goals:  Short term goal(s) established: Yes  Short term goal(s) to be met within: 2-3 sessions  STG 1: Pt will transfer to/from toilet with supervision and RW  STG 1 status: Met  STG 2: Pt will complete 1-2 grooming/hygiene ADL standing sinkside with supervision/set-up  STG 2 status: Met  STG 3: Pt will complete LB dressing with min assist and ADLAE prn  STG 3 status: Not met  STG 4: Pt will verbalize 2 energy conservation strategies in prep for ADL routine  STG 4 status: Not met    Long Term Goals:  Long term goal(s) established: Yes  Long term goal(s) to be met within: d/c from [facility]  LTG 1: Pt will transfer to/from toilet with mod I and RW  LTG 1 status: Not met  LTG 2: Pt will complete 1-2 grooming/hygiene ADL standing sinkside with mod I  LTG 2 status: Not met  LTG 3: Pt will complete LB dressing with supervision and ADLAE prn  LTG 3 status: Not Met  LTG 4: Pt will improve AM PAC Self Care score to 22/24  LTG 4 status: Not met    Patient Goal: To go home  Number of OT goals set: 8  Number of OT goals met: 2  Number of OT treatments: 2    Patient agreeable to goals and plan of care.    OT Assessment:  Therapy Rehab Potential: Good for stated goals  OT Assessment: Pt seen this PM for OTx treatment session on day 4 of admission for pneumonia and sepsis. Pt demonstrating progress towards OTx goals, meeting 2/8 goals this session. Pt  completing functional transfers and short household distance ambulation at grossly supervision-level with use of rolling walker; sinkside g/h with supervision however continues to require mod assist for LB dressing and max assist for LB bathing/hygiene due to incontinence. Pt with 1 LOB this session requiring min assist possibly related to orthostatic hypotension (see activity tolerance) despite patient denial of symptoms. Will continue to benefit from skilled OTx and recommend Home PT/OTx upon discharge to maximize independence and safety in self-care tasks.    Treatment Plan:  Therapy Rehab Potential: Good for stated goals  OT Frequency: 2-3x/wk  OT Treatment Duration: until d/c from [facility]  OT Days seen: Treatment     OT Treatment Plan: ADL routine       Fall protocol implemented and gait belt utilized as indicated throughout session.    Time Calculation:  Start Time: 1511  Stop Time: 1536  Unbillable Time (min): 0  OT Treatment Duration: until d/c from [facility]  Total Treatment Time: 25 min  Units of Service: 2 Self  Care  Time-based Minutes: SC    LORANE LOISE BUDGE, OTR/L  Pager [phone]    [facility]  [address]  [place], [place] [address]

## 2019-09-30 ENCOUNTER — Encounter: Payer: Self-pay | Admitting: Critical Care Medicine

## 2019-09-30 ENCOUNTER — Inpatient Hospital Stay: Payer: PRIVATE HEALTH INSURANCE

## 2019-09-30 ENCOUNTER — Emergency Department: Payer: PRIVATE HEALTH INSURANCE

## 2019-09-30 ENCOUNTER — Encounter
Admission: EM | Disposition: A | Discharge status: Other Acute Inpatient Hospital | Payer: Self-pay | Source: Home / Self Care | Attending: Critical Care Medicine

## 2019-09-30 ENCOUNTER — Inpatient Hospital Stay
Admission: EM | Admit: 2019-09-30 | Discharge: 2019-10-10 | DRG: 870 | Disposition: A | Discharge status: Other Acute Inpatient Hospital | Payer: PRIVATE HEALTH INSURANCE | Attending: Pulmonary Disease | Admitting: Pulmonary Disease

## 2019-09-30 DIAGNOSIS — J690 Pneumonitis due to inhalation of food and vomit: Secondary | ICD-10-CM | POA: Diagnosis present

## 2019-09-30 DIAGNOSIS — J156 Pneumonia due to other aerobic Gram-negative bacteria: Secondary | ICD-10-CM | POA: Diagnosis present

## 2019-09-30 DIAGNOSIS — N170 Acute kidney failure with tubular necrosis: Secondary | ICD-10-CM | POA: Diagnosis present

## 2019-09-30 DIAGNOSIS — I255 Ischemic cardiomyopathy: Secondary | ICD-10-CM | POA: Diagnosis present

## 2019-09-30 DIAGNOSIS — I513 Intracardiac thrombosis, not elsewhere classified: Secondary | ICD-10-CM | POA: Diagnosis present

## 2019-09-30 DIAGNOSIS — E875 Hyperkalemia: Secondary | ICD-10-CM | POA: Diagnosis not present

## 2019-09-30 DIAGNOSIS — R569 Unspecified convulsions: Secondary | ICD-10-CM | POA: Diagnosis not present

## 2019-09-30 DIAGNOSIS — E1110 Type 2 diabetes mellitus with ketoacidosis without coma: Secondary | ICD-10-CM | POA: Diagnosis present

## 2019-09-30 DIAGNOSIS — R6521 Severe sepsis with septic shock: Secondary | ICD-10-CM | POA: Diagnosis present

## 2019-09-30 DIAGNOSIS — I252 Old myocardial infarction: Secondary | ICD-10-CM

## 2019-09-30 DIAGNOSIS — I459 Conduction disorder, unspecified: Secondary | ICD-10-CM | POA: Insufficient documentation

## 2019-09-30 DIAGNOSIS — G931 Anoxic brain damage, not elsewhere classified: Secondary | ICD-10-CM | POA: Diagnosis present

## 2019-09-30 DIAGNOSIS — I272 Pulmonary hypertension, unspecified: Secondary | ICD-10-CM | POA: Diagnosis present

## 2019-09-30 DIAGNOSIS — I129 Hypertensive chronic kidney disease with stage 1 through stage 4 chronic kidney disease, or unspecified chronic kidney disease: Secondary | ICD-10-CM | POA: Diagnosis present

## 2019-09-30 DIAGNOSIS — N390 Urinary tract infection, site not specified: Secondary | ICD-10-CM | POA: Diagnosis present

## 2019-09-30 DIAGNOSIS — Z8673 Personal history of transient ischemic attack (TIA), and cerebral infarction without residual deficits: Secondary | ICD-10-CM

## 2019-09-30 DIAGNOSIS — E7800 Pure hypercholesterolemia, unspecified: Secondary | ICD-10-CM | POA: Diagnosis present

## 2019-09-30 DIAGNOSIS — N182 Chronic kidney disease, stage 2 (mild): Secondary | ICD-10-CM | POA: Diagnosis present

## 2019-09-30 DIAGNOSIS — G253 Myoclonus: Secondary | ICD-10-CM | POA: Diagnosis not present

## 2019-09-30 DIAGNOSIS — Z20828 Contact with and (suspected) exposure to other viral communicable diseases: Secondary | ICD-10-CM | POA: Diagnosis present

## 2019-09-30 DIAGNOSIS — E11649 Type 2 diabetes mellitus with hypoglycemia without coma: Secondary | ICD-10-CM | POA: Diagnosis not present

## 2019-09-30 DIAGNOSIS — D509 Iron deficiency anemia, unspecified: Secondary | ICD-10-CM | POA: Diagnosis present

## 2019-09-30 DIAGNOSIS — Z7984 Long term (current) use of oral hypoglycemic drugs: Secondary | ICD-10-CM

## 2019-09-30 DIAGNOSIS — Z79899 Other long term (current) drug therapy: Secondary | ICD-10-CM

## 2019-09-30 DIAGNOSIS — E785 Hyperlipidemia, unspecified: Secondary | ICD-10-CM | POA: Diagnosis present

## 2019-09-30 DIAGNOSIS — I442 Atrioventricular block, complete: Secondary | ICD-10-CM | POA: Diagnosis present

## 2019-09-30 DIAGNOSIS — I2510 Atherosclerotic heart disease of native coronary artery without angina pectoris: Secondary | ICD-10-CM | POA: Diagnosis present

## 2019-09-30 DIAGNOSIS — Z87891 Personal history of nicotine dependence: Secondary | ICD-10-CM

## 2019-09-30 DIAGNOSIS — D6959 Other secondary thrombocytopenia: Secondary | ICD-10-CM | POA: Diagnosis present

## 2019-09-30 DIAGNOSIS — Z781 Physical restraint status: Secondary | ICD-10-CM

## 2019-09-30 DIAGNOSIS — I82611 Acute embolism and thrombosis of superficial veins of right upper extremity: Secondary | ICD-10-CM | POA: Diagnosis not present

## 2019-09-30 DIAGNOSIS — I469 Cardiac arrest, cause unspecified: Secondary | ICD-10-CM | POA: Diagnosis present

## 2019-09-30 DIAGNOSIS — E8770 Fluid overload, unspecified: Secondary | ICD-10-CM | POA: Diagnosis not present

## 2019-09-30 DIAGNOSIS — A419 Sepsis, unspecified organism: Principal | ICD-10-CM | POA: Diagnosis present

## 2019-09-30 DIAGNOSIS — I4510 Unspecified right bundle-branch block: Secondary | ICD-10-CM | POA: Diagnosis present

## 2019-09-30 DIAGNOSIS — E1122 Type 2 diabetes mellitus with diabetic chronic kidney disease: Secondary | ICD-10-CM | POA: Diagnosis present

## 2019-09-30 DIAGNOSIS — Z951 Presence of aortocoronary bypass graft: Secondary | ICD-10-CM

## 2019-09-30 DIAGNOSIS — J80 Acute respiratory distress syndrome: Secondary | ICD-10-CM | POA: Diagnosis present

## 2019-09-30 HISTORY — PX: TEMPORARY PACER INSERTION: CATH35

## 2019-09-30 LAB — URINALYSIS, REFLEX TO MICROSCOPIC EXAM IF INDICATED
Bilirubin, UA: NEGATIVE
Blood, UA: NEGATIVE
Glucose, UA: NEGATIVE
Ketones UA: 5 — AB
Nitrite, UA: NEGATIVE
Protein, UR: 100 — AB
Specific Gravity UA: 1.018 (ref 1.001–1.035)
Urine pH: 5 (ref 5.0–8.0)
Urobilinogen, UA: NORMAL mg/dL (ref 0.2–2.0)

## 2019-09-30 LAB — CBC
Absolute NRBC: 0 10*3/uL (ref 0.00–0.00)
Hematocrit: 35.7 % — ABNORMAL LOW (ref 37.6–49.6)
Hgb: 11.5 g/dL — ABNORMAL LOW (ref 12.5–17.1)
MCH: 29.3 pg (ref 25.1–33.5)
MCHC: 32.2 g/dL (ref 31.5–35.8)
MCV: 90.8 fL (ref 78.0–96.0)
MPV: 12.5 fL (ref 8.9–12.5)
Nucleated RBC: 0 /100 WBC (ref 0.0–0.0)
Platelets: 112 10*3/uL — ABNORMAL LOW (ref 142–346)
RBC: 3.93 10*6/uL — ABNORMAL LOW (ref 4.20–5.90)
RDW: 13 % (ref 11–15)
WBC: 16.7 10*3/uL — ABNORMAL HIGH (ref 3.10–9.50)

## 2019-09-30 LAB — MAN DIFF ONLY
Band Neutrophils Absolute: 0 10*3/uL (ref 0.00–1.00)
Band Neutrophils: 0 %
Basophils Absolute Manual: 0 10*3/uL (ref 0.00–0.08)
Basophils Manual: 0 %
Eosinophils Absolute Manual: 0.39 10*3/uL (ref 0.00–0.44)
Eosinophils Manual: 2 %
Lymphocytes Absolute Manual: 12.54 10*3/uL — ABNORMAL HIGH (ref 0.42–3.22)
Lymphocytes Manual: 65 %
Monocytes Absolute: 0.77 10*3/uL (ref 0.21–0.85)
Monocytes Manual: 4 %
Neutrophils Absolute Manual: 5.59 10*3/uL (ref 1.10–6.33)
Segmented Neutrophils: 29 %

## 2019-09-30 LAB — ARTERIAL BLOOD GAS (~~LOC~~)
ARTERIAL PCO2 CORRECTED: 28 mmHg — ABNORMAL LOW (ref 35.0–45.0)
ARTERIAL PH CORRECTED: 7.28 — ABNORMAL LOW (ref 7.35–7.45)
Arterial pO2 Corrected: 228 mmHg — ABNORMAL HIGH (ref 80.0–100.0)
Base Excess, Arterial: -12.2 mEq/L — ABNORMAL LOW (ref <=2.0)
FIO2: 70 %
HCO3, Arterial: 13.5 mEq/L — ABNORMAL LOW (ref 22.0–28.0)
O2 Sat, Arterial: 99.8 % (ref 95.0–100.0)
PEEP: 5
Rate: 18 {beats}/min
Temperature: 96.4
Tidal vol.: 450
pCO2, Arterial: 30 mmHg — ABNORMAL LOW (ref 35.0–45.0)
pH, Arterial: 7.26 — ABNORMAL LOW (ref 7.35–7.45)
pO2, Arterial: 234 mmHg — ABNORMAL HIGH (ref 80.0–100.0)

## 2019-09-30 LAB — COMPREHENSIVE METABOLIC PANEL
ALT: 65 U/L — ABNORMAL HIGH (ref 0–55)
ALT: 81 U/L — ABNORMAL HIGH (ref 0–55)
AST (SGOT): 53 U/L — ABNORMAL HIGH (ref 5–34)
AST (SGOT): 63 U/L — ABNORMAL HIGH (ref 5–34)
Albumin/Globulin Ratio: 1.1 (ref 0.9–2.2)
Albumin/Globulin Ratio: 1.2 (ref 0.9–2.2)
Albumin: 3.7 g/dL (ref 3.5–5.0)
Albumin: 3.4 g/dL — ABNORMAL LOW (ref 3.5–5.0)
Alkaline Phosphatase: 97 U/L (ref 38–106)
Alkaline Phosphatase: 94 U/L (ref 38–106)
Anion Gap: 20 — ABNORMAL HIGH (ref 5.0–15.0)
BUN: 78.3 mg/dL — ABNORMAL HIGH (ref 9.0–28.0)
BUN: 80.4 mg/dL — ABNORMAL HIGH (ref 9.0–28.0)
Bilirubin, Total: 0.4 mg/dL (ref 0.2–1.2)
Bilirubin, Total: 0.4 mg/dL (ref 0.2–1.2)
CO2: <6 mEq/L — CL (ref 22–29)
CO2: 11 mEq/L — ABNORMAL LOW (ref 22–29)
Calcium: 9.1 mg/dL (ref 8.5–10.5)
Calcium: 9.7 mg/dL (ref 8.5–10.5)
Chloride: 114 mEq/L — ABNORMAL HIGH (ref 100–111)
Chloride: 110 mEq/L (ref 100–111)
Creatinine: 4.1 mg/dL — ABNORMAL HIGH (ref 0.7–1.3)
Creatinine: 3.9 mg/dL — ABNORMAL HIGH (ref 0.7–1.3)
Globulin: 3.3 g/dL (ref 2.0–3.6)
Globulin: 2.9 g/dL (ref 2.0–3.6)
Glucose: 225 mg/dL — ABNORMAL HIGH (ref 70–100)
Glucose: 332 mg/dL — ABNORMAL HIGH (ref 70–100)
Potassium: 4.2 mEq/L (ref 3.5–5.1)
Potassium: 5.4 mEq/L — ABNORMAL HIGH (ref 3.5–5.1)
Protein, Total: 7 g/dL (ref 6.0–8.3)
Protein, Total: 6.3 g/dL (ref 6.0–8.3)
Sodium: 141 mEq/L (ref 136–145)
Sodium: 141 mEq/L (ref 136–145)

## 2019-09-30 LAB — CBC AND DIFFERENTIAL
Absolute NRBC: 0 10*3/uL (ref 0.00–0.00)
Hematocrit: 35.5 % — ABNORMAL LOW (ref 37.6–49.6)
Hgb: 10.8 g/dL — ABNORMAL LOW (ref 12.5–17.1)
MCH: 29.2 pg (ref 25.1–33.5)
MCHC: 30.4 g/dL — ABNORMAL LOW (ref 31.5–35.8)
MCV: 95.9 fL (ref 78.0–96.0)
MPV: 12.2 fL (ref 8.9–12.5)
Nucleated RBC: 0 /100 WBC (ref 0.0–0.0)
Platelets: 186 10*3/uL (ref 142–346)
RBC: 3.7 10*6/uL — ABNORMAL LOW (ref 4.20–5.90)
RDW: 13 % (ref 11–15)
WBC: 19.29 10*3/uL — ABNORMAL HIGH (ref 3.10–9.50)

## 2019-09-30 LAB — APTT: PTT: 29 s (ref 23–37)

## 2019-09-30 LAB — LACTIC ACID, PLASMA
Lactic Acid: 6.1 mmol/L (ref 0.2–2.0)
Lactic Acid: 5.5 mmol/L (ref 0.2–2.0)

## 2019-09-30 LAB — PT/INR
PT INR: 1.1 (ref 0.9–1.1)
PT INR: 1.1 (ref 0.9–1.1)
PT: 14.3 s (ref 12.6–15.0)
PT: 14.1 s (ref 12.6–15.0)

## 2019-09-30 LAB — COVID-19 (SARS-COV-2): SARS CoV 2 Overall Result: NEGATIVE

## 2019-09-30 LAB — MAGNESIUM
Magnesium: 2.6 mg/dL (ref 1.6–2.6)
Magnesium: 2.3 mg/dL (ref 1.6–2.6)

## 2019-09-30 LAB — CELL MORPHOLOGY
Cell Morphology: ABNORMAL — AB
Platelet Estimate: NORMAL

## 2019-09-30 LAB — GLUCOSE WHOLE BLOOD - POCT
Whole Blood Glucose POCT: 199 mg/dL — ABNORMAL HIGH (ref 70–100)
Whole Blood Glucose POCT: 330 mg/dL — ABNORMAL HIGH (ref 70–100)

## 2019-09-30 LAB — PHOSPHORUS: Phosphorus: 5.6 mg/dL — ABNORMAL HIGH (ref 2.3–4.7)

## 2019-09-30 LAB — CALCIUM, IONIZED: Calcium, Ionized: 2.49 mEq/L (ref 2.20–2.60)

## 2019-09-30 LAB — GFR
EGFR: 14.9
EGFR: 15.8

## 2019-09-30 LAB — TROPONIN I: Troponin I: 0.03 ng/mL (ref 0.00–0.05)

## 2019-09-30 SURGERY — TEMPORARY PACER INSERTION
Anesthesia: Conscious Sedation | Site: Chest

## 2019-09-30 MED ORDER — INSULIN REGULAR 100 UNITS IN 100 ML NS (PREMIX)
3.00 [IU]/h | INTRAVENOUS | Status: DC
Start: 2019-09-30 — End: 2019-10-01
  Administered 2019-09-30: 23:00:00 4 [IU]/h via INTRAVENOUS
  Filled 2019-09-30: qty 100

## 2019-09-30 MED ORDER — EPINEPHRINE HCL 1 MG/ML IJ SOLN (WRAP)
Status: AC | PRN
Start: 2019-09-30 — End: 2019-09-30
  Administered 2019-09-30: 14:00:00 5 ug/min via INTRAVENOUS

## 2019-09-30 MED ORDER — HEPARIN SODIUM (PORCINE) 1000 UNIT/ML IJ SOLN
INTRAMUSCULAR | Status: AC | PRN
Start: 2019-09-30 — End: 2019-09-30
  Administered 2019-09-30: 2500 [IU] via INTRAVENOUS

## 2019-09-30 MED ORDER — FENTANYL CITRATE-NACL 1-0.9 MG/100ML-% IV SOLN
50.00 ug/h | INTRAVENOUS | Status: DC
Start: 2019-09-30 — End: 2019-10-02
  Administered 2019-09-30 – 2019-10-01 (×3): 50 ug/h via INTRAVENOUS
  Administered 2019-10-01: 19:00:00 75 ug/h via INTRAVENOUS
  Administered 2019-10-02: 05:00:00 100 ug/h via INTRAVENOUS
  Filled 2019-09-30 (×4): qty 100

## 2019-09-30 MED ORDER — FENTANYL CITRATE (PF) 50 MCG/ML IJ SOLN (WRAP)
50.00 ug | INTRAMUSCULAR | Status: DC | PRN
Start: 2019-09-30 — End: 2019-10-03
  Administered 2019-10-01: 50 ug via INTRAVENOUS

## 2019-09-30 MED ORDER — ATROPINE SULFATE 0.1 MG/ML IJ SOLN (WRAP)
INTRAMUSCULAR | Status: AC | PRN
Start: 2019-09-30 — End: 2019-09-30
  Administered 2019-09-30: 1 mg via INTRAVENOUS

## 2019-09-30 MED ORDER — EPINEPHRINE HCL 1 MG/ML IJ SOLN (WRAP)
0.00 ug/min | Status: DC
Start: 2019-09-30 — End: 2019-09-30
  Filled 2019-09-30: qty 4

## 2019-09-30 MED ORDER — ATROPINE SULFATE 0.1 MG/ML IJ SOLN (WRAP)
INTRAMUSCULAR | Status: AC
Start: 2019-09-30 — End: 2019-10-01
  Filled 2019-09-30: qty 10

## 2019-09-30 MED ORDER — SODIUM CHLORIDE 0.9 % IV MBP
500.00 mg | Freq: Three times a day (TID) | INTRAVENOUS | Status: DC
Start: 2019-09-30 — End: 2019-10-01
  Administered 2019-09-30 – 2019-10-01 (×2): 500 mg via INTRAVENOUS
  Filled 2019-09-30 (×4): qty 0.5

## 2019-09-30 MED ORDER — SODIUM BICARBONATE 8.4 % IV SOLN
100.00 meq | Freq: Once | INTRAVENOUS | Status: DC
Start: 2019-09-30 — End: 2019-10-01

## 2019-09-30 MED ORDER — VECURONIUM BROMIDE 10 MG IV SOLR
10.00 mg | Freq: Once | INTRAVENOUS | Status: AC
Start: 2019-09-30 — End: 2019-09-30
  Administered 2019-09-30: 22:00:00 10 mg via INTRAVENOUS
  Filled 2019-09-30 (×2): qty 10

## 2019-09-30 MED ORDER — LIDOCAINE HCL 1 % IJ SOLN
INTRAMUSCULAR | Status: AC | PRN
Start: 2019-09-30 — End: 2019-09-30
  Administered 2019-09-30: 5 mL via SUBCUTANEOUS

## 2019-09-30 MED ORDER — CALCIUM CHLORIDE 10 % IV SOLN
INTRAVENOUS | Status: AC | PRN
Start: 2019-09-30 — End: 2019-09-30
  Administered 2019-09-30: 1 g via INTRAVENOUS

## 2019-09-30 MED ORDER — NOREPINEPHRINE 8 MG/100 ML (SIMPLE)
0.00 ug/min | Status: DC
Start: 2019-09-30 — End: 2019-10-07
  Administered 2019-09-30: 23:00:00 15 ug/min via INTRAVENOUS
  Administered 2019-09-30: 19:00:00 30 ug/min via INTRAVENOUS
  Administered 2019-09-30: 18:00:00 5 ug/min via INTRAVENOUS
  Administered 2019-10-01: 07:00:00 20 ug/min via INTRAVENOUS
  Administered 2019-10-01 (×2): 22 ug/min via INTRAVENOUS
  Administered 2019-10-02: 05:00:00 15 ug/min via INTRAVENOUS
  Administered 2019-10-02: 17:00:00 10 ug/min via INTRAVENOUS
  Administered 2019-10-03: 15:00:00 5 ug/min via INTRAVENOUS
  Administered 2019-10-05 (×2): 3 ug/min via INTRAVENOUS
  Filled 2019-09-30 (×9): qty 100

## 2019-09-30 MED ORDER — SODIUM BICARBONATE 8.4 % IV SOLN
INTRAVENOUS | Status: AC
Start: 2019-09-30 — End: ?
  Filled 2019-09-30: qty 50

## 2019-09-30 MED ORDER — DOBUTAMINE IN D5W 2 MG/ML IV SOLN
INTRAVENOUS | Status: AC | PRN
Start: 2019-09-30 — End: 2019-09-30
  Administered 2019-09-30: 2 ug/kg/min via INTRAVENOUS

## 2019-09-30 MED ORDER — HEPARIN SODIUM (PORCINE) 5000 UNIT/ML IJ SOLN
5000.00 [IU] | Freq: Two times a day (BID) | INTRAMUSCULAR | Status: DC
Start: 2019-09-30 — End: 2019-10-03
  Administered 2019-09-30 – 2019-10-03 (×5): 5000 [IU] via SUBCUTANEOUS
  Filled 2019-09-30 (×6): qty 1

## 2019-09-30 MED ORDER — DEXTROSE 5 % IV SOLN
2.00 ug/kg/min | INTRAVENOUS | Status: DC
Start: 2019-09-30 — End: 2019-10-02
  Administered 2019-09-30: 20:00:00 20 ug/kg/min via INTRAVENOUS
  Administered 2019-10-01: 15:00:00 8 ug/kg/min via INTRAVENOUS
  Administered 2019-10-01: 01:00:00 20 ug/kg/min via INTRAVENOUS
  Filled 2019-09-30 (×6): qty 40

## 2019-09-30 MED ORDER — SODIUM CHLORIDE 0.9 % IV BOLUS
1000.00 mL | Freq: Once | INTRAVENOUS | Status: DC
Start: 2019-09-30 — End: 2019-10-07

## 2019-09-30 MED ORDER — SODIUM CHLORIDE 0.9 % IV BOLUS
1000.00 mL | Freq: Once | INTRAVENOUS | Status: AC
Start: 2019-09-30 — End: 2019-09-30
  Administered 2019-09-30: 19:00:00 1000 mL via INTRAVENOUS

## 2019-09-30 MED ORDER — SODIUM CHLORIDE 0.9 % IV BOLUS
INTRAVENOUS | Status: AC | PRN
Start: 2019-09-30 — End: 2019-09-30
  Administered 2019-09-30: 1000 mL via INTRAVENOUS

## 2019-09-30 MED ORDER — MIDAZOLAM 100 MG/100 ML IN NACL 0.9% IV SOLN
2.00 mg/h | INTRAVENOUS | Status: DC
Start: 2019-09-30 — End: 2019-10-03
  Administered 2019-09-30: 19:00:00 2 mg/h via INTRAVENOUS
  Administered 2019-10-01: 07:00:00 4 mg/h via INTRAVENOUS
  Administered 2019-10-02: 20:00:00 8 mg/h via INTRAVENOUS
  Administered 2019-10-02: 04:00:00 6 mg/h via INTRAVENOUS
  Administered 2019-10-03: 06:00:00 10 mg/h via INTRAVENOUS
  Filled 2019-09-30 (×4): qty 100

## 2019-09-30 MED ORDER — SODIUM BICARBONATE 8.4 % IV SOLN
INTRAVENOUS | Status: AC | PRN
Start: 2019-09-30 — End: 2019-09-30
  Administered 2019-09-30: 50 meq via INTRAVENOUS

## 2019-09-30 MED ORDER — LIDOCAINE HCL (PF) 1 % IJ SOLN
INTRAMUSCULAR | Status: AC
Start: 2019-09-30 — End: ?
  Filled 2019-09-30: qty 30

## 2019-09-30 MED ORDER — MIDAZOLAM HCL 1 MG/ML IJ SOLN (WRAP)
2.50 mg | INTRAMUSCULAR | Status: AC | PRN
Start: 2019-09-30 — End: 2019-09-30

## 2019-09-30 MED ORDER — FENTANYL CITRATE (PF) 50 MCG/ML IJ SOLN (WRAP)
50.00 ug | INTRAMUSCULAR | Status: DC | PRN
Start: 2019-09-30 — End: 2019-10-03
  Filled 2019-09-30: qty 2

## 2019-09-30 MED ORDER — FAMOTIDINE 10 MG/ML IV SOLN (WRAP)
20.00 mg | Freq: Two times a day (BID) | INTRAVENOUS | Status: DC
Start: 2019-09-30 — End: 2019-10-01
  Administered 2019-09-30 – 2019-10-01 (×2): 20 mg via INTRAVENOUS
  Filled 2019-09-30 (×2): qty 2

## 2019-09-30 MED ORDER — EPINEPHRINE HCL 0.1 MG/ML IJ/IV SOSY (WRAP)
PREFILLED_SYRINGE | Status: AC | PRN
Start: 2019-09-30 — End: 2019-09-30
  Administered 2019-09-30: 1 mg via INTRAVENOUS

## 2019-09-30 MED ORDER — MIDAZOLAM HCL 1 MG/ML IJ SOLN (WRAP)
2.50 mg | INTRAMUSCULAR | Status: DC | PRN
Start: 2019-09-30 — End: 2019-10-01

## 2019-09-30 MED ORDER — MIDAZOLAM 100 MG/100 ML IN NACL 0.9% IV SOLN
3.00 mg/h | INTRAVENOUS | Status: DC
Start: 2019-09-30 — End: 2019-09-30
  Administered 2019-09-30: 15:00:00 10 mg/h via INTRAVENOUS
  Filled 2019-09-30: qty 100

## 2019-09-30 MED ORDER — CALCIUM CHLORIDE 10 % IV SOLN
1.00 g | Freq: Once | INTRAVENOUS | Status: DC
Start: 2019-09-30 — End: 2019-10-01

## 2019-09-30 MED ORDER — EPINEPHRINE HCL 1 MG/ML IJ SOLN (WRAP)
0.05 ug/kg/min | Status: DC
Start: 2019-09-30 — End: 2019-09-30
  Filled 2019-09-30: qty 2.5

## 2019-09-30 MED ORDER — SODIUM BICARBONATE 8.4 % IV SOLN
100.00 mL/h | INTRAVENOUS | Status: DC
Start: 2019-09-30 — End: 2019-10-02
  Administered 2019-09-30 – 2019-10-01 (×3): 100 mL/h via INTRAVENOUS
  Filled 2019-09-30 (×4): qty 1000

## 2019-09-30 MED ORDER — DEXTROSE 50 % IV SOLN
INTRAVENOUS | Status: AC
Start: 2019-09-30 — End: 2019-10-01
  Filled 2019-09-30: qty 50

## 2019-09-30 MED ORDER — HEPARIN SODIUM (PORCINE) 1000 UNIT/ML IJ SOLN
INTRAMUSCULAR | Status: AC
Start: 2019-09-30 — End: ?
  Filled 2019-09-30: qty 10

## 2019-09-30 MED ORDER — SODIUM CHLORIDE 0.9 % IV BOLUS
1000.00 mL | Freq: Once | INTRAVENOUS | Status: AC
Start: 2019-09-30 — End: 2019-09-30
  Administered 2019-09-30: 22:00:00 1000 mL via INTRAVENOUS

## 2019-09-30 MED ORDER — SODIUM BICARBONATE 8.4 % IV SOLN
50.00 meq | Freq: Once | INTRAVENOUS | Status: AC
Start: 2019-09-30 — End: 2019-09-30
  Administered 2019-09-30: 18:00:00 50 meq via INTRAVENOUS
  Filled 2019-09-30: qty 50

## 2019-09-30 NOTE — Progress Notes (Signed)
09/30/19 2130   Provider Notification   Reason for Communication Critical lab value   Provider Name Dr. Lesle Reek   Provider Role Attending physician   Method of Communication Face to face   Response No new orders   Notification Time 2125   Lactic 5.5, starting bicarb gtt and meropenem

## 2019-09-30 NOTE — Procedures (Signed)
Left internal jugular vein triple-lumen catheter placement.    Anesthesia: 1% Xylocaine local instillation    Technique: The area of the left sternocleidomastoid triangle was prepped and draped in usual sterile fashion.  Internal jugular vein was identified using sonogram.  After local anesthesia, a 19-gauge catheter was introduced and advanced into the internal jugular vein.  Guidewire was advanced, subcutaneous tissue was dilated, then triple-lumen catheter was placed via the guidewire.  Good blood return 3 ports.  Tolerated well.  Chest x-ray shows good position.    Signed: Kirstie Mirza MD

## 2019-09-30 NOTE — Brief Op Note (Signed)
BRIEF OP NOTE    Date Time: 09/30/19 3:42 PM    Patient Name:   Keith Gates    Date of Operation:   09/30/2019    Providers Performing:   Surgeon(s):  Josph Macho I, MD    Assistant (s):   * No surgical staff found *    Operative Procedure:   Procedure(s):  TEMPORARY PACER INSERTION    Preoperative Diagnosis:   Pre-Op Diagnosis Codes:     * Heart block [I45.9]    Postoperative Diagnosis:   Post-Op Diagnosis Codes:     * Heart block [I45.9]    Anesthesia:   Conscious Sedation    Estimated Blood Loss:    * No values recorded between 09/30/2019 12:00 AM and 09/30/2019  3:42 PM *    Implants:   * No implants in log *      Specimens:   * No specimens in log *      Findings:   - 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

## 2019-09-30 NOTE — Progress Notes (Signed)
1603 patient arrived in ICU; report received from [facility] with [facility].  Patient intubated and sedated; V paced with HR 70, BP 116/66, SpO2 100%.      1630 informed Dr. Lesle Reek zero NPI for both left and right pupils; also informed of absent gag/cough; absent corneals; no movement to painful stimulus, no purposeful movement.  Notified Dr. Lesle Reek of no current head CT.     1700 RN inquired Dr Lesle Reek about pancultures. Orders received.    1704 started TTM per Dr. Lesle Reek.      1800 notified Dr. Lesle Reek of NPI 0.5 R eye, flicker of movement RUE.  Still no purposeful movement.      1830 inquired Dr. Lesle Reek about bicarb drip. No orders received.

## 2019-09-30 NOTE — Progress Notes (Signed)
09/30/19 1900   Provider Notification   Reason for Communication Critical lab value   Time of Critical Value Notification 1857   Critical Lab Microbiology   Critical Lab Value Lactic Acid 6.1   Total Notification Time to LIP (minutes) 4   Provider Name Dr. [name]   Provider Role Intensivist   Method of Communication Face to face   Readback Results Yes   Response In department   Notification Time 1901

## 2019-09-30 NOTE — Op Note (Signed)
Informed consent was not obtained due to the emergent nature of the procedure. The patient was prepped in the usual fashion. The underlying rhythm was complete heart block with no ventricular escape, and the patient was being transcutaneously paced. Using ultrasound guidance, a 6 F sheath was placed in the right femoral vein and through that sheath a quadripolar catheter was advanced under fluoroscopic guidance into the right ventricular apex in a stable position. The pacing threshold was 0.2 ma.  It was set at a rate of 70 bpm and 10 mA. The sheath was then sutured to the skin and the pacing wire was affixed to the skin using a tegaderm.    Krystal Clark, MD  Cardiac Electrophysiology  [facility]

## 2019-09-30 NOTE — Progress Notes (Signed)
Called wife Nelwyn Salisbury about making the daughter the point of contact for the patient. Used the translation services, [employer] [phone] translated. Patient wife said that would be fine, her daughter can be the point of contact. Also, gave the pin number to wife and daughter if they need to call in for updates.

## 2019-09-30 NOTE — H&P (Signed)
ADMISSION HISTORY AND PHYSICAL EXAM    Date Time: 09/30/19 11:30 PM  Patient Name: Keith Gates  Attending Physician: Durward Fortes, MD    Assessment:   This patient with diabetes, hypertension, hypercholesterolemia, prior CVA, prior CABG earlier this year, presents with cardiorespiratory arrest.  He has leukocytosis, pyuria, acute renal failure, hyperglycemia and severe metabolic acidosis.  Troponin was initially normal.  Clinical picture may be 1 of cardiorespiratory arrest due to septic shock.  Differential diagnosis also includes cardiac arrhythmia.    Plan:   Ventilatory support, hemodynamic support, fluid electrolyte management.  Intravenous insulin infusion, broad-spectrum antibiotics.  Trend cardiac enzymes.  Given neurologic unresponsiveness and presentation with cardiorespiratory arrest well hydrated, no rash therapeutic hypothermia.  Giving present need for hemodynamic inotropic support, will aim at a temperature of 36 degrees to avoid further hemodynamic instability.  I discussed the case with the patient's wife.  Prognosis is guarded.    History of Present Illness:   Keith Gates is a 63 y.o. 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  The patient presented to the hospital after cardiac arrest.  History was obtained from the wife over the telephone using translator.  Reportedly the patient had shortness of breath today.  No complaints of chest pain.  When she checked on him around midday he was weak and unarousable in his bed.  There was no trauma.  EMS was called.  They found the patient on well minimally responsive, bradycardic, then asystolic.  CPR/ACLS was done for about 6 minutes.  ROSC was obtained.  Patient was intubated.  In the emergency department the patient again had cardiac arrest with PEA and bradycardia.  Complete heart block was noted.  2 bouts of CPR were done.  Epinephrine was  started.  Subsequently patient was taken to the vascular lab where temporary intravenous pacemaker was placed with good capture.  He is now on dobutamine and epinephrine infusion admitted to intensive care unit.  He is unresponsive.  Reportedly patient has been compliant with his medications.  Past Medical History:     Past Medical History:   Diagnosis Date    Diabetes mellitus     Elevated cholesterol     Hypertension     Stroke        Past Surgical History:     Past Surgical History:   Procedure Laterality Date    CORONARY ARTERY BYPASS GRAFT  01/2018       Family History:   History reviewed. No pertinent family history.    Social History:     Social History     Socioeconomic History    Marital status: Married     Spouse name: Not on file    Number of children: Not on file    Years of education: Not on file    Highest education level: Not on file   Occupational History    Not on file   Social Needs    Financial resource strain: Not on file    Food insecurity     Worry: Not on file     Inability: Not on file    Transportation needs     Medical: Not on file     Non-medical: Not on file   Tobacco Use    Smoking status: Former Smoker     Years: 30.00     Quit date: 10/17/2002     Years since quitting: 16.9  Smokeless tobacco: Never Used   Substance and Sexual Activity    Alcohol use: Yes    Drug use: No    Sexual activity: Not on file   Lifestyle    Physical activity     Days per week: Not on file     Minutes per session: Not on file    Stress: Not on file   Relationships    Social connections     Talks on phone: Not on file     Gets together: Not on file     Attends religious service: Not on file     Active member of club or organization: Not on file     Attends meetings of clubs or organizations: Not on file     Relationship status: Not on file    Intimate partner violence     Fear of current or ex partner: Not on file     Emotionally abused: Not on file     Physically abused: Not on file      Forced sexual activity: Not on file   Other Topics Concern    Not on file   Social History Narrative    Not on file       Allergies:   No Known Allergies    Medications:     Medications Prior to Admission   Medication Sig    atorvastatin (LIPITOR) 40 MG tablet Take 40 mg by mouth daily    metFORMIN (GLUCOPHAGE) 1000 MG tablet Take 1,000 mg by mouth 2 (two) times daily with meals    midodrine (PROAMATINE) 5 MG tablet Take 5 mg by mouth 2 (two) times daily with meals    SITagliptin (JANUVIA) 100 MG tablet Take 100 mg by mouth daily     Scheduled Meds:  Current Facility-Administered Medications   Medication Dose Route Frequency    atropine        calcium chloride  1 g Intravenous Once    dextrose        famotidine  20 mg Intravenous Q12H SCH    heparin (porcine)  5,000 Units Subcutaneous Q12H SCH    meropenem  500 mg Intravenous Q8H    sodium bicarbonate  100 mEq Intravenous Once    sodium chloride  1,000 mL Intravenous Once     Continuous Infusions:   IV fluids with sodium bicarbonate 100 mL/hr (09/30/19 2314)    DOBUTamine (DOBUTREX) 2 mg/mL IVPB 250 mL 20 mcg/kg/min (09/30/19 1959)    EPINEPHrine Stopped (09/30/19 1858)    fentaNYL 100 mcg/hr (09/30/19 2140)    insulin regular 4 Units/hr (09/30/19 2305)    midazolam 4 mg/hr (09/30/19 2140)    norepinephrine (LEVOPHED) infusion 15 mcg/min (09/30/19 2327)     PRN Meds:.fentaNYL (PF), fentaNYL (PF), midazolam    Review of Systems:   A comprehensive review of systems was: Not to assess from patient due to unresponsiveness.  From history, no GI bleed, positive shortness of breath, no chest pain, no diarrhea.  No fever.  No Covid exposures.    Physical Exam:     Vitals:    09/30/19 2315   BP: 110/61   Pulse: 70   Resp: 18   Temp:    SpO2: 100%   On IV epinephrine and dobutamine infusions.    Intake and Output Summary (Last 24 hours) at Date Time    Intake/Output Summary (Last 24 hours) at 09/30/2019 2330  Last data filed at 09/30/2019 2326  Gross per  24 hour  Intake 712.4 ml   Output 305 ml   Net 407.4 ml       General appearance -unresponsive.  Orally intubated.  Pale.  Mental status -unresponsive to verbal or noxious stimuli.  Eyes - pupils 4 mm, sluggish, sclera anicteric  Ears - bilateral TM's and external ear canals normal  Nose - normal and patent, no erythema, discharge or polyps  Mouth - mucous membranes moist, pharynx normal without lesions.  ET tube orally placed  Neck - supple, no significant adenopathy, no  Jugular venous distension  Lymphatics - no palpable lymphadenopathy, no hepatosplenomegaly  Chest -bilateral rhonchi.    Sternotomy scar healed.  Small crusted area in the skin, in the medial left subcostal area in the epigastrium.  No erythema no drainage.  Heart -paced rhythm.  Normal rate, regular rhythm, normal S1, S2, no murmurs, rubs, clicks or gallops  Abdomen - soft,  nondistended, no masses or organomegaly.  Bowel sounds hypoactive  Rectal - deferred, not clinically indicated  Back exam -no palpable spasm or pain on motion, no ecchymosis  Neurological -slight flexion of upper extremities with stimuli.  No purposeful response.  Sluggish pupils 4 mm.  Presently no gag, no cough.  Musculoskeletal - no joint  deformity or swelling  Extremities - peripheral pulses diminished, no pedal edema, no clubbing or cyanosis  Skin -pale coloration and decreased turgor, no rashes, no suspicious skin lesions noted    Labs:   Recent CBC   Recent Labs   Lab 09/30/19  1809 09/30/19  1411   WBC 16.70* 19.29*   RBC 3.93* 3.70*   Hgb 11.5* 10.8*   Hematocrit 35.7* 35.5*   MCV 90.8 95.9   Platelets 112* 186       Recent Labs   Lab 09/30/19  1809 09/30/19  1412 09/30/19  1411   Sodium 141  --  141   Potassium 5.4*  --  4.2   Chloride 110  --  114*   CO2 11*  --  <6*   Glucose 332*  --  225*   BUN 80.4*  --  78.3*   Creatinine 3.9*  --  4.1*   Magnesium 2.3  --  2.6   Phosphorus 5.6*  --   --    AST (SGOT) 63*  --  53*   ALT 81*  --  65*   Alkaline Phosphatase 94   --  97   PT INR 1.1  --  1.1   PT 14.1  --  14.3   PTT  --   --  29   Troponin I  --  0.03  --        Rads:     Radiology Results (24 Hour)     Procedure Component Value Units Date/Time    XR Chest AP Portable [[id number]] Collected: 09/30/19 2254    Order Status: Completed Updated: 09/30/19 2258    Narrative:      HISTORY: Central line placement verification    COMPARISON: 10/30/2018    TECHNIQUE: Portable AP view of the chest    FINDINGS: A left IJ terminates within the central SVC. A pacing lead has  been placed by femoral approach overlapping the left heart. Enteric tube  tip terminates within the distal stomach region or proximal duodenum.  Linear atelectasis is noted at the left lung base. Long bones are low.  No effusion or pneumothorax. The endotracheal tube is in good position  tip terminating 3.5 cm above the carina.  Impression:          Lines in good position. Hypoventilation with left basilar atelectasis.  No significant consolidation or pneumothorax.    Charlott Rakes, MD   09/30/2019 10:56 PM    Chest AP Portable [[id number]] Collected: 09/30/19 1523    Order Status: Completed Updated: 09/30/19 1529    Narrative:      HISTORY: chest pain, cardiac arrest.     COMPARISON: 11/10/2018    TECHNIQUE: Portable AP supine chest obtained    FINDINGS: Sternal wires are redemonstrated. Exam is limited by artifact  from support device. Enteric tube traverses the diaphragm with the tip  in the left upper quadrant in the region of the stomach. Endotracheal  tube tip is at about T2 with the Carina difficult to clearly see. Heart  size is more prominent than the prior study probably within normal  limits for technique and level of inspiration. Bilateral infiltrates are  identified. Thickening is seen in the minor fissure suspicious for  effusion..      Impression:       Bilateral infiltrates suspicious for pulmonary edema.              Will Bonnet, MD   09/30/2019 3:26 PM      EKG paced rhythm  Urinalysis turbid,  specific gravity 1.018, 100 protein, 5 ketones, too numerous to count white cells, many white blood cell clumps.      I have personally reviewed the patients history and 24 hour interval events, along with vitals, labs, radiology images and  ventilator settings and additional findings found in detail within ICU team notes, with their care plans developed with and reviewed by me.    Time spent in patient evaluation and treatment in critical care excluding procedures, and not overlapping any other providers:  60  minutes.    Signed by: Durward Fortes, MD

## 2019-09-30 NOTE — ED Notes (Addendum)
Keith Gates (wife) # [phone] speaks [place]

## 2019-09-30 NOTE — ED Provider Notes (Signed)
History     Chief Complaint   Patient presents with    Cardiac Arrest     63 yo male with cardiac arrest, ROSC per EMS.  EMS states pt appeared unwell was bradycardic and then quickly went to asystole on their arrival. Describe about 6 minutes of ACLS care until ROSC.  Nothing known about pre-event symptoms.  Chest compressions delivered on scene.    The history is provided by the patient. No language interpreter was used.   Cardiac Arrest  Witnessed by:  Healthcare provider  Incident location:  Home  Time before ALS initiated:  Immediate  Condition upon EMS arrival:  Unresponsive  Pulse:  Present  Initial cardiac rhythm per EMS:  Sinus bradycardia  Treatments prior to arrival:  ACLS protocol       Past Medical History:   Diagnosis Date    Diabetes mellitus     Elevated cholesterol     Hypertension     Stroke        No past surgical history on file.    No family history on file.    Social  Social History     Tobacco Use    Smoking status: Former Smoker     Years: 30.00     Quit date: 10/17/2002     Years since quitting: 16.9    Smokeless tobacco: Never Used   Substance Use Topics    Alcohol use: Yes    Drug use: No       .     No Known Allergies    Home Medications             atorvastatin (LIPITOR) 40 MG tablet     Take 40 mg by mouth daily     metFORMIN (GLUCOPHAGE) 1000 MG tablet     Take 1,000 mg by mouth 2 (two) times daily with meals     midodrine (PROAMATINE) 5 MG tablet     Take 5 mg by mouth 2 (two) times daily with meals     SITagliptin (JANUVIA) 100 MG tablet     Take 100 mg by mouth daily           Review of Systems    Physical Exam    BP: (!) 142/96, Heart Rate: (!) 54, Temp: (!) 96.4 F (35.8 C), Resp Rate: 20, SpO2: 100 %, Weight: 80.6 kg    Physical Exam      MDM and ED Course     ED Medication Orders (From admission, onward)    Start Ordered     Status Ordering Provider    09/30/19 1500 09/30/19 1421    Continuous     Route: Intravenous  Ordered Dose: 0.05 mcg/kg/min     Discontinued  Claire Bridge J    09/30/19 1449 09/30/19 1450  DOBUTamine (DOBUTREX) 500 mg/250 mL in dextrose 5% infusion (premix)  Code/trauma/sedation continuous med     Route: Intravenous     Last MAR action: Handoff/Dual RN Verify Darnelle Bos, RICHARD V    09/30/19 1439 09/30/19 1439  DOBUTamine (DOBUTREX) 2 mg/mL in dextrose 5 % 250 mL infusion  Continuous     Route: Intravenous  Ordered Dose: 2-20 mcg/kg/min     Last MAR action: Handoff/Dual RN Verify Cassandria Santee, Clyde Upshaw J    09/30/19 1434 09/30/19 1435  calcium chloride 10 % injection  Code/trauma/sedation medication      Last MAR action: Given Lorre Nick V    09/30/19 1432 09/30/19 1431  midazolam (VERSED)  100 mg in sodium chloride 0.9% 100 mL infusion SOLN  Continuous     Route: Intravenous  Ordered Dose: 3-10 mg/hr     Last MAR action: No Dual Sign - Rate/Dose Change Sergei Delo J    09/30/19 1432 09/30/19 1431  fentaNYL 1000 mcg in sodium chloride 0.9% infusion 100 mL  Continuous     Route: Intravenous  Ordered Dose: 50 mcg/hr     Last MAR action: Handoff/Dual RN Verify Roselee Culver J    09/30/19 1430 09/30/19 1429  sodium chloride 0.9 % bolus 1,000 mL  Once     Route: Intravenous  Ordered Dose: 1,000 mL     Acknowledged Maxxon Schwanke J    09/30/19 1429 09/30/19 1428  calcium chloride 10 % injection 1 g  Once     Route: Intravenous  Ordered Dose: 1 g     Acknowledged Ronit Cranfield J    09/30/19 1429 09/30/19 1428  sodium bicarbonate 8.4 % injection 100 mEq  Once     Route: Intravenous  Ordered Dose: 100 mEq     Acknowledged Roselee Culver J    09/30/19 1427 09/30/19 1427  sodium bicarbonate 8.4 % injection  Code/trauma/sedation medication      Last MAR action: Given Campbell Lerner    09/30/19 1426 09/30/19 1427  calcium chloride 10 % injection  Code/trauma/sedation medication      Last MAR action: Given Lorre Nick V    09/30/19 1425 09/30/19 1425  EPINEPHrine (ADRENALIN) 4,000 mcg in sodium chloride 0.9 % 100 mL infusion  Code/trauma/sedation  continuous med     Route: Intravenous     Last MAR action: Handoff/Dual RN Verify Darnelle Bos, RICHARD V    09/30/19 1422 09/30/19 1422  atropine injection  Code/trauma/sedation medication      Last MAR action: Given Lorre Nick V    09/30/19 1422 09/30/19 1422  atropine 0.1 MG/ML injection     Note to Pharmacy: Jamse Arn A: cabinet override    Ordered     09/30/19 1421 09/30/19 1421  dextrose 50 % bolus     Note to Pharmacy: Jamse Arn A: cabinet override    Ordered     09/30/19 1420 09/30/19 1422  atropine injection  Code/trauma/sedation medication      Last MAR action: Given Lorre Nick V    09/30/19 1420 09/30/19 1439  sodium chloride 0.9 % bolus  Code/trauma/sedation continuous med     Route: Intravenous     Last MAR action: [employer], RICHARD V    09/30/19 1406 09/30/19 1406  atropine injection  Code/trauma/sedation medication      Last MAR action: Given Lorre Nick V    09/30/19 1403 09/30/19 1404  EPINEPHrine (ADRENALIN) injection  Code/trauma/sedation medication      Last MAR action: Given BRENNER, RICHARD V             MDM  Number of Diagnoses or Management Options  Cardiac arrest:   Heart block:   Diagnosis management comments: I, Roselee Culver, DO (emergency medicine physician), have been the primary provider for this patient during this Emergency Dept visit.     I have reviewed nursing notes on family and social history, past medical issues, and recorded vital signs.    I have reviewed all laboratory tests and radiographic studies (final reports only if available) and have explained these results to the patient and/or family bedside.    Oxygen saturation by pulse oximetry is 95%-100%, Normal.  Interventions: Oxygen  Administration.    EKG interpretation by Dr. Cassandria Santee, emergency medicine physician.  EKG is Abnormal and nonspecific, heart block  ST segments show nonspecific abnormalities but there is no acute st elevation.  Rate is 52  No significant widened intervals.    Pt  arrives unresponsive, in heart block.  Pt became bradycardic and went asystolic, chest compressions resumed, epinephrine given, 1 mg atropine given.    At 2 minute rhythm check, pulse restored.    D/w Dr Julius Bowels, interventional cardiology.  Advised to d/w Dr Marnette Burgess who is here assessing pt now.    D/w Dr Marnette Burgess and holding anticoagulation.  Due to bradycardia, holding cooling blanket.             Amount and/or Complexity of Data Reviewed  Clinical lab tests: ordered and reviewed  Tests in the radiology section of CPT: ordered  Tests in the medicine section of CPT: ordered and reviewed  Decide to obtain previous medical records or to obtain history from someone other than the patient: yes  Obtain history from someone other than the patient: yes  Review and summarize past medical records: yes  Discuss the patient with other providers: yes (Interventional cardiology  General cardiology)  Independent visualization of images, tracings, or specimens: yes                     Critical Care  Performed by: Toni Arthurs, DO  Authorized by: Toni Arthurs, DO     Critical care provider statement:     Critical care time (minutes):  65    Critical care was necessary to treat or prevent imminent or life-threatening deterioration of the following conditions:  Cardiac failure and circulatory failure    Critical care was time spent personally by me on the following activities:  Development of treatment plan with patient or surrogate, discussions with consultants, evaluation of patient's response to treatment, examination of patient, ordering and performing treatments and interventions, ordering and review of laboratory studies, ordering and review of radiographic studies, pulse oximetry, re-evaluation of patient's condition, transcutaneous pacing, obtaining history from patient or surrogate, interpretation of cardiac output measurements and ventilator management    I assumed direction of critical care for this patient from  another provider in my specialty: no          Clinical Impression & Disposition     Clinical Impression  Final diagnoses:   Cardiac arrest   Heart block        ED Disposition     ED Disposition Condition Date/Time Comment    Send to Cath Lab  Mon Sep 30, 2019  2:41 PM            Current Discharge Medication List                    Toni Arthurs, DO  09/30/19 1628

## 2019-09-30 NOTE — Consults (Signed)
[place] HEART CARDIOLOGY CONSULTATION REPORT  [facility]    Date Time: 09/30/19 2:30 PM  Patient Name: Keith Gates  Requesting Physician: Toni Arthurs, DO       Reason for Consultation:   Cardiac arrest      History:   Keith Gates is a 63 y.o. male admitted on 09/30/2019.  We have been asked by Toni Arthurs, DO,  to provide cardiac consultation, regarding cardiac arrest.  Patient is followed by [facility] cardiology.  He was in the hospital in April and had a non-ST elevation myocardial infarction.  Echo showed an ejection fraction 20%.  He was stable and transferred to [facility] where a left heart catheterization was apparently done and subsequent coronary bypass grafting based on his surgical scars.  The patient was last seen by his wife around lunchtime.  He was not complaining of any cardiac symptoms but did feel fatigued.  She went to get him an orange juice and when she returned he was less responsive so 911 was called.  When EMS arrived, he was minimally responsive and quickly lost consciousness.  CPR was initiated.  On route to ER, there is some report of ST elevation although we could not find an EKG to corroborate this.  In the emergency room, he has had CPR and rhythm shows complete heart block.  The emergency room contacted Dr. Julius Bowels and initially called a STEMI.  General cardiology was asked to evaluate the patient first.    Past Medical History:     Past Medical History:   Diagnosis Date    Diabetes mellitus     Elevated cholesterol     Hypertension     Stroke        Past Surgical History:   No past surgical history on file.    Family History:   No family history on file.    Social History:     Social History     Socioeconomic History    Marital status: Married     Spouse name: Not on file    Number of children: Not on file    Years of education: Not on file    Highest education level: Not on file   Occupational History    Not on file   Social Needs     Financial resource strain: Not on file    Food insecurity     Worry: Not on file     Inability: Not on file    Transportation needs     Medical: Not on file     Non-medical: Not on file   Tobacco Use    Smoking status: Former Smoker     Years: 30.00     Quit date: 10/17/2002     Years since quitting: 16.9    Smokeless tobacco: Never Used   Substance and Sexual Activity    Alcohol use: Yes    Drug use: No    Sexual activity: Not on file   Lifestyle    Physical activity     Days per week: Not on file     Minutes per session: Not on file    Stress: Not on file   Relationships    Social connections     Talks on phone: Not on file     Gets together: Not on file     Attends religious service: Not on file     Active member of club or organization: Not on file  Attends meetings of clubs or organizations: Not on file     Relationship status: Not on file    Intimate partner violence     Fear of current or ex partner: Not on file     Emotionally abused: Not on file     Physically abused: Not on file     Forced sexual activity: Not on file   Other Topics Concern    Not on file   Social History Narrative    Not on file       Allergies:   No Known Allergies    Medications:   (Not in a hospital admission)        Current Facility-Administered Medications   Medication Dose Route Frequency Provider Last Rate Last Admin    atropine 0.1 MG/ML injection             calcium chloride 10 % injection 1 g  1 g Intravenous Once Bernier, Dennis J, DO        dextrose 50 % bolus             EPINEPHrine (ADRENALIN) 2,500 mcg in dextrose 5 % 50 mL infusion  0.05 mcg/kg/min Intravenous Continuous Toni Arthurs, DO        EPINEPHrine (ADRENALIN) 4,000 mcg in sodium chloride 0.9 % 100 mL infusion   Intravenous Code/Trauma Continuous Med Campbell Lerner, MD 7.5 mL/hr at 09/30/19 1425 5 mcg/min at 09/30/19 1425    sodium bicarbonate 4.2 % injection (infant) 100 mEq  100 mEq Intravenous Once Toni Arthurs, DO         sodium chloride 0.9 % bolus 1,000 mL  1,000 mL Intravenous Once Toni Arthurs, DO         Current Outpatient Medications   Medication Sig Dispense Refill    atorvastatin (LIPITOR) 40 MG tablet Take 40 mg by mouth daily      metFORMIN (GLUCOPHAGE) 1000 MG tablet Take 1,000 mg by mouth 2 (two) times daily with meals      midodrine (PROAMATINE) 5 MG tablet Take 5 mg by mouth 2 (two) times daily with meals      SITagliptin (JANUVIA) 100 MG tablet Take 100 mg by mouth daily           Review of Systems:    Comprehensive review of systems including constitutional, eyes, ears, nose, mouth, throat, cardiovascular, GI, GU, musculoskeletal, integumentary, respiratory, neurologic, psychiatric, and endocrine is negative other than what is mentioned already in the history of present illness    Physical Exam:     Vitals:    09/30/19 1425   BP: (!) 43/30   Pulse: (!) 28   Resp: 18   Temp:    SpO2:      Temp (24hrs), Avg:96.4 F (35.8 C), Min:96.4 F (35.8 C), Max:96.4 F (35.8 C)      Intake and Output Summary (Last 24 hours) at Date Time  No intake or output data in the 24 hours ending 09/30/19 1430    General: Unresponsive, intubated        Labs Reviewed:                             Recent Labs   Lab 09/30/19  1411   WBC 19.29*   Hgb 10.8*   Hematocrit 35.5*   Platelets 186             Radiology   Radiological Procedure reviewed.  chest X-ray  Assessment:    Cardiac arrest with complete heart block   CAD status post CABG   LVEF 20% by echo April 2019    Recommendations:    Case was discussed with Dr. Julius Bowels and electrophysiologist Dr. Nedra Hai.   Recommendation at this time is for a temporary pacemaker wire which Dr. Nedra Hai will perform.  Eventually he may need a left heart catheterization, but we will plan to stabilize first and reevaluate after the temporary wire is complete.   Due to ongoing efforts for CPR, would avoid IV heparin for now.   Also due to bradycardia, would avoid cooling as he would be excluded  from this protocol with his rhythm instability.   Admit patient to intensive care unit.   Risk of death is high.        Signed by: Sherlean Foot, MD      [facility]  NP Spectralink [phone] (8am-5pm)  MD Spectralink [phone] (8am-5pm)  After hours, non urgent consult line [phone]  After Hours, urgent consults [phone]

## 2019-10-01 ENCOUNTER — Inpatient Hospital Stay: Payer: PRIVATE HEALTH INSURANCE

## 2019-10-01 ENCOUNTER — Ambulatory Visit (INDEPENDENT_AMBULATORY_CARE_PROVIDER_SITE_OTHER): Payer: Self-pay

## 2019-10-01 ENCOUNTER — Encounter: Payer: Self-pay | Admitting: Cardiovascular Disease

## 2019-10-01 LAB — CBC
Absolute NRBC: 0 10*3/uL (ref 0.00–0.00)
Hematocrit: 29.5 % — ABNORMAL LOW (ref 37.6–49.6)
Hgb: 10.2 g/dL — ABNORMAL LOW (ref 12.5–17.1)
MCH: 29.6 pg (ref 25.1–33.5)
MCHC: 34.6 g/dL (ref 31.5–35.8)
MCV: 85.5 fL (ref 78.0–96.0)
MPV: 12.4 fL (ref 8.9–12.5)
Nucleated RBC: 0 /100 WBC (ref 0.0–0.0)
Platelets: 113 10*3/uL — ABNORMAL LOW (ref 142–346)
RBC: 3.45 10*6/uL — ABNORMAL LOW (ref 4.20–5.90)
RDW: 13 % (ref 11–15)
WBC: 15.37 10*3/uL — ABNORMAL HIGH (ref 3.10–9.50)

## 2019-10-01 LAB — BASIC METABOLIC PANEL
Anion Gap: 12 (ref 5.0–15.0)
Anion Gap: 17 — ABNORMAL HIGH (ref 5.0–15.0)
Anion Gap: 13 (ref 5.0–15.0)
Anion Gap: 11 (ref 5.0–15.0)
BUN: 76.4 mg/dL — ABNORMAL HIGH (ref 9.0–28.0)
BUN: 77.5 mg/dL — ABNORMAL HIGH (ref 9.0–28.0)
BUN: 71 mg/dL — ABNORMAL HIGH (ref 9.0–28.0)
BUN: 66.7 mg/dL — ABNORMAL HIGH (ref 9.0–28.0)
CO2: 15 mEq/L — ABNORMAL LOW (ref 22–29)
CO2: 13 mEq/L — ABNORMAL LOW (ref 22–29)
CO2: 18 mEq/L — ABNORMAL LOW (ref 22–29)
CO2: 22 mEq/L (ref 22–29)
Calcium: 8.5 mg/dL (ref 8.5–10.5)
Calcium: 8.9 mg/dL (ref 8.5–10.5)
Calcium: 8.4 mg/dL — ABNORMAL LOW (ref 8.5–10.5)
Calcium: 8.3 mg/dL — ABNORMAL LOW (ref 8.5–10.5)
Chloride: 109 mEq/L (ref 100–111)
Chloride: 109 mEq/L (ref 100–111)
Chloride: 106 mEq/L (ref 100–111)
Chloride: 108 mEq/L (ref 100–111)
Creatinine: 3.4 mg/dL — ABNORMAL HIGH (ref 0.7–1.3)
Creatinine: 3.5 mg/dL — ABNORMAL HIGH (ref 0.7–1.3)
Creatinine: 3.1 mg/dL — ABNORMAL HIGH (ref 0.7–1.3)
Creatinine: 2.8 mg/dL — ABNORMAL HIGH (ref 0.7–1.3)
Glucose: 392 mg/dL — ABNORMAL HIGH (ref 70–100)
Glucose: 349 mg/dL — ABNORMAL HIGH (ref 70–100)
Glucose: 311 mg/dL — ABNORMAL HIGH (ref 70–100)
Glucose: 180 mg/dL — ABNORMAL HIGH (ref 70–100)
Potassium: 7.3 mEq/L (ref 3.5–5.1)
Potassium: 6.9 mEq/L (ref 3.5–5.1)
Potassium: 5.4 mEq/L — ABNORMAL HIGH (ref 3.5–5.1)
Potassium: 4.1 mEq/L (ref 3.5–5.1)
Sodium: 136 mEq/L (ref 136–145)
Sodium: 139 mEq/L (ref 136–145)
Sodium: 137 mEq/L (ref 136–145)
Sodium: 141 mEq/L (ref 136–145)

## 2019-10-01 LAB — COMPREHENSIVE METABOLIC PANEL
ALT: 66 U/L — ABNORMAL HIGH (ref 0–55)
ALT: 62 U/L — ABNORMAL HIGH (ref 0–55)
AST (SGOT): 52 U/L — ABNORMAL HIGH (ref 5–34)
AST (SGOT): 54 U/L — ABNORMAL HIGH (ref 5–34)
Albumin/Globulin Ratio: 1 (ref 0.9–2.2)
Albumin/Globulin Ratio: 1 (ref 0.9–2.2)
Albumin: 3.1 g/dL — ABNORMAL LOW (ref 3.5–5.0)
Albumin: 2.7 g/dL — ABNORMAL LOW (ref 3.5–5.0)
Alkaline Phosphatase: 86 U/L (ref 38–106)
Alkaline Phosphatase: 83 U/L (ref 38–106)
Anion Gap: 15 (ref 5.0–15.0)
Anion Gap: 12 (ref 5.0–15.0)
BUN: 76.9 mg/dL — ABNORMAL HIGH (ref 9.0–28.0)
BUN: 58.5 mg/dL — ABNORMAL HIGH (ref 9.0–28.0)
Bilirubin, Total: 0.6 mg/dL (ref 0.2–1.2)
Bilirubin, Total: 0.6 mg/dL (ref 0.2–1.2)
CO2: 15 mEq/L — ABNORMAL LOW (ref 22–29)
CO2: 24 mEq/L (ref 22–29)
Calcium: 8.4 mg/dL — ABNORMAL LOW (ref 8.5–10.5)
Calcium: 8.1 mg/dL — ABNORMAL LOW (ref 8.5–10.5)
Chloride: 109 mEq/L (ref 100–111)
Chloride: 106 mEq/L (ref 100–111)
Creatinine: 3.4 mg/dL — ABNORMAL HIGH (ref 0.7–1.3)
Creatinine: 2.6 mg/dL — ABNORMAL HIGH (ref 0.7–1.3)
Globulin: 3.1 g/dL (ref 2.0–3.6)
Globulin: 2.6 g/dL (ref 2.0–3.6)
Glucose: 385 mg/dL — ABNORMAL HIGH (ref 70–100)
Glucose: 170 mg/dL — ABNORMAL HIGH (ref 70–100)
Potassium: 6.5 mEq/L (ref 3.5–5.1)
Potassium: 4.3 mEq/L (ref 3.5–5.1)
Protein, Total: 6.2 g/dL (ref 6.0–8.3)
Protein, Total: 5.3 g/dL — ABNORMAL LOW (ref 6.0–8.3)
Sodium: 139 mEq/L (ref 136–145)
Sodium: 142 mEq/L (ref 136–145)

## 2019-10-01 LAB — LACTIC ACID, PLASMA: Lactic Acid: 3.5 mmol/L — ABNORMAL HIGH (ref 0.2–2.0)

## 2019-10-01 LAB — PT/INR
PT INR: 1.1 (ref 0.9–1.1)
PT INR: 1.2 — ABNORMAL HIGH (ref 0.9–1.1)
PT INR: 1.2 — ABNORMAL HIGH (ref 0.9–1.1)
PT INR: 1.2 — ABNORMAL HIGH (ref 0.9–1.1)
PT INR: 1.2 — ABNORMAL HIGH (ref 0.9–1.1)
PT: 14.2 s (ref 12.6–15.0)
PT: 15 s (ref 12.6–15.0)
PT: 15 s (ref 12.6–15.0)
PT: 15.2 s — ABNORMAL HIGH (ref 12.6–15.0)
PT: 15.3 s — ABNORMAL HIGH (ref 12.6–15.0)

## 2019-10-01 LAB — ECG 12-LEAD
Atrial Rate: 113 {beats}/min
Atrial Rate: 107 {beats}/min
P Axis: 59 degrees
Q-T Interval: 480 ms
Q-T Interval: 384 ms
QRS Duration: 178 ms
QRS Duration: 128 ms
QTC Calculation (Bezet): 518 ms
QTC Calculation (Bezet): 522 ms
R Axis: -58 degrees
R Axis: -75 degrees
T Axis: 81 degrees
T Axis: 76 degrees
Ventricular Rate: 70 {beats}/min
Ventricular Rate: 111 {beats}/min

## 2019-10-01 LAB — GLUCOSE WHOLE BLOOD - POCT
Whole Blood Glucose POCT: 115 mg/dL — ABNORMAL HIGH (ref 70–100)
Whole Blood Glucose POCT: 115 mg/dL — ABNORMAL HIGH (ref 70–100)
Whole Blood Glucose POCT: 121 mg/dL — ABNORMAL HIGH (ref 70–100)
Whole Blood Glucose POCT: 122 mg/dL — ABNORMAL HIGH (ref 70–100)
Whole Blood Glucose POCT: 137 mg/dL — ABNORMAL HIGH (ref 70–100)
Whole Blood Glucose POCT: 151 mg/dL — ABNORMAL HIGH (ref 70–100)
Whole Blood Glucose POCT: 161 mg/dL — ABNORMAL HIGH (ref 70–100)
Whole Blood Glucose POCT: 164 mg/dL — ABNORMAL HIGH (ref 70–100)
Whole Blood Glucose POCT: 178 mg/dL — ABNORMAL HIGH (ref 70–100)
Whole Blood Glucose POCT: 195 mg/dL — ABNORMAL HIGH (ref 70–100)
Whole Blood Glucose POCT: 197 mg/dL — ABNORMAL HIGH (ref 70–100)
Whole Blood Glucose POCT: 211 mg/dL — ABNORMAL HIGH (ref 70–100)
Whole Blood Glucose POCT: 227 mg/dL — ABNORMAL HIGH (ref 70–100)
Whole Blood Glucose POCT: 248 mg/dL — ABNORMAL HIGH (ref 70–100)
Whole Blood Glucose POCT: 280 mg/dL — ABNORMAL HIGH (ref 70–100)
Whole Blood Glucose POCT: 283 mg/dL — ABNORMAL HIGH (ref 70–100)
Whole Blood Glucose POCT: 289 mg/dL — ABNORMAL HIGH (ref 70–100)
Whole Blood Glucose POCT: 293 mg/dL — ABNORMAL HIGH (ref 70–100)
Whole Blood Glucose POCT: 302 mg/dL — ABNORMAL HIGH (ref 70–100)
Whole Blood Glucose POCT: 321 mg/dL — ABNORMAL HIGH (ref 70–100)
Whole Blood Glucose POCT: 325 mg/dL — ABNORMAL HIGH (ref 70–100)
Whole Blood Glucose POCT: 328 mg/dL — ABNORMAL HIGH (ref 70–100)
Whole Blood Glucose POCT: 337 mg/dL — ABNORMAL HIGH (ref 70–100)
Whole Blood Glucose POCT: 351 mg/dL — ABNORMAL HIGH (ref 70–100)
Whole Blood Glucose POCT: 361 mg/dL — ABNORMAL HIGH (ref 70–100)

## 2019-10-01 LAB — TROPONIN I
Troponin I: 1.09 ng/mL (ref 0.00–0.05)
Troponin I: 0.93 ng/mL (ref 0.00–0.05)

## 2019-10-01 LAB — ARTERIAL BLOOD GAS (~~LOC~~)
ARTERIAL PCO2 CORRECTED: 28 mmHg — ABNORMAL LOW (ref 35.0–45.0)
ARTERIAL PCO2 CORRECTED: 30 mmHg — ABNORMAL LOW (ref 35.0–45.0)
ARTERIAL PH CORRECTED: 7.36 (ref 7.35–7.45)
ARTERIAL PH CORRECTED: 7.46 — ABNORMAL HIGH (ref 7.35–7.45)
Arterial pO2 Corrected: 184 mmHg — ABNORMAL HIGH (ref 80.0–100.0)
Arterial pO2 Corrected: 189 mmHg — ABNORMAL HIGH (ref 80.0–100.0)
Base Excess, Arterial: -8.2 mEq/L — ABNORMAL LOW (ref <=2.0)
Base Excess, Arterial: -1.6 mEq/L (ref <=2.0)
FIO2: 50 %
FIO2: 50 %
HCO3, Arterial: 16 mEq/L — ABNORMAL LOW (ref 22.0–28.0)
HCO3, Arterial: 21.5 mEq/L — ABNORMAL LOW (ref 22.0–28.0)
O2 Sat, Arterial: 99.6 % (ref 95.0–100.0)
O2 Sat, Arterial: 99.7 % (ref 95.0–100.0)
PEEP: 5
PEEP: 5
Rate: 18 {beats}/min
Rate: 18 {beats}/min
Temperature: 96.8
Temperature: 97
Tidal vol.: 450
Tidal vol.: 450
pCO2, Arterial: 29 mmHg — ABNORMAL LOW (ref 35.0–45.0)
pCO2, Arterial: 31 mmHg — ABNORMAL LOW (ref 35.0–45.0)
pH, Arterial: 7.35 (ref 7.35–7.45)
pH, Arterial: 7.45 (ref 7.35–7.45)
pO2, Arterial: 189 mmHg — ABNORMAL HIGH (ref 80.0–100.0)
pO2, Arterial: 194 mmHg — ABNORMAL HIGH (ref 80.0–100.0)

## 2019-10-01 LAB — CBC AND DIFFERENTIAL
Absolute NRBC: 0 10*3/uL (ref 0.00–0.00)
Basophils Absolute Automated: 0.02 10*3/uL (ref 0.00–0.08)
Basophils Automated: 0.1 %
Eosinophils Absolute Automated: 0.01 10*3/uL (ref 0.00–0.44)
Eosinophils Automated: 0.1 %
Hematocrit: 36.2 % — ABNORMAL LOW (ref 37.6–49.6)
Hgb: 11.8 g/dL — ABNORMAL LOW (ref 12.5–17.1)
Immature Granulocytes Absolute: 0.09 10*3/uL — ABNORMAL HIGH (ref 0.00–0.07)
Immature Granulocytes: 0.5 %
Lymphocytes Absolute Automated: 0.74 10*3/uL (ref 0.42–3.22)
Lymphocytes Automated: 4.2 %
MCH: 28.9 pg (ref 25.1–33.5)
MCHC: 32.6 g/dL (ref 31.5–35.8)
MCV: 88.7 fL (ref 78.0–96.0)
MPV: 12.5 fL (ref 8.9–12.5)
Monocytes Absolute Automated: 1.41 10*3/uL — ABNORMAL HIGH (ref 0.21–0.85)
Monocytes: 8 %
Neutrophils Absolute: 15.41 10*3/uL — ABNORMAL HIGH (ref 1.10–6.33)
Neutrophils: 87.1 %
Nucleated RBC: 0 /100 WBC (ref 0.0–0.0)
Platelets: 113 10*3/uL — ABNORMAL LOW (ref 142–346)
RBC: 4.08 10*6/uL — ABNORMAL LOW (ref 4.20–5.90)
RDW: 13 % (ref 11–15)
WBC: 17.68 10*3/uL — ABNORMAL HIGH (ref 3.10–9.50)

## 2019-10-01 LAB — PROTEIN / CREATININE RATIO, URINE
Urine Creatinine, Random: 45.4 mg/dL
Urine Protein Random: 10.7 mg/dL (ref 1.0–14.0)
Urine Protein/Creatinine Ratio: 0.2

## 2019-10-01 LAB — CALCIUM, IONIZED
Calcium, Ionized: 2.35 mEq/L (ref 2.20–2.60)
Calcium, Ionized: 2.36 mEq/L (ref 2.20–2.60)
Calcium, Ionized: 2.39 mEq/L (ref 2.20–2.60)
Calcium, Ionized: 2.4 mEq/L (ref 2.20–2.60)
Calcium, Ionized: 2.46 mEq/L (ref 2.20–2.60)

## 2019-10-01 LAB — PHOSPHORUS
Phosphorus: 1.3 mg/dL — ABNORMAL LOW (ref 2.3–4.7)
Phosphorus: 1.5 mg/dL — ABNORMAL LOW (ref 2.3–4.7)
Phosphorus: 2.1 mg/dL — ABNORMAL LOW (ref 2.3–4.7)
Phosphorus: 2.3 mg/dL (ref 2.3–4.7)
Phosphorus: 2.7 mg/dL (ref 2.3–4.7)
Phosphorus: 2.8 mg/dL (ref 2.3–4.7)
Phosphorus: 2.9 mg/dL (ref 2.3–4.7)

## 2019-10-01 LAB — URINALYSIS WITH MICROSCOPIC
Bilirubin, UA: NEGATIVE
Glucose, UA: NEGATIVE
Ketones UA: NEGATIVE
Nitrite, UA: NEGATIVE
Protein, UR: 30 — AB
Specific Gravity UA: 1.01 (ref 1.001–1.035)
Urine pH: 6 (ref 5.0–8.0)
Urobilinogen, UA: NORMAL mg/dL (ref 0.2–2.0)

## 2019-10-01 LAB — MAGNESIUM
Magnesium: 1.7 mg/dL (ref 1.6–2.6)
Magnesium: 1.7 mg/dL (ref 1.6–2.6)
Magnesium: 1.8 mg/dL (ref 1.6–2.6)
Magnesium: 1.9 mg/dL (ref 1.6–2.6)
Magnesium: 2 mg/dL (ref 1.6–2.6)
Magnesium: 2 mg/dL (ref 1.6–2.6)

## 2019-10-01 LAB — GFR
EGFR: 17.9
EGFR: 18.5
EGFR: 18.5
EGFR: 20.5
EGFR: 23.1
EGFR: 25.2

## 2019-10-01 LAB — COVID-19 (SARS-COV-2)(SOFT): SARS CoV 2 Overall Result: NOT DETECTED

## 2019-10-01 LAB — SODIUM, URINE, RANDOM: Urine Sodium Random: 28 mEq/L

## 2019-10-01 MED ORDER — MAGNESIUM SULFATE IN D5W 1-5 GM/100ML-% IV SOLN
1.00 g | Freq: Once | INTRAVENOUS | Status: AC
Start: 2019-10-01 — End: 2019-10-01
  Administered 2019-10-01: 12:00:00 1 g via INTRAVENOUS
  Filled 2019-10-01: qty 100

## 2019-10-01 MED ORDER — MEROPENEM 500 MG IV SOLR
500.00 mg | Freq: Two times a day (BID) | INTRAVENOUS | Status: DC
Start: 2019-10-01 — End: 2019-10-02
  Administered 2019-10-01 – 2019-10-02 (×2): 500 mg via INTRAVENOUS
  Filled 2019-10-01 (×3): qty 0.5

## 2019-10-01 MED ORDER — FAMOTIDINE 10 MG/ML IV SOLN (WRAP)
20.00 mg | INTRAVENOUS | Status: DC
Start: 2019-10-02 — End: 2019-10-03
  Administered 2019-10-02 – 2019-10-03 (×2): 20 mg via INTRAVENOUS
  Filled 2019-10-01 (×2): qty 2

## 2019-10-01 MED ORDER — DEXTROSE 50 % IV SOLN
12.50 g | INTRAVENOUS | Status: DC | PRN
Start: 2019-10-01 — End: 2019-10-02

## 2019-10-01 MED ORDER — MIDAZOLAM HCL 1 MG/ML IJ SOLN (WRAP)
2.00 mg | INTRAMUSCULAR | Status: DC | PRN
Start: 2019-10-01 — End: 2019-10-03
  Administered 2019-10-01 – 2019-10-02 (×4): 2 mg via INTRAVENOUS
  Filled 2019-10-01 (×4): qty 2

## 2019-10-01 MED ORDER — DEXTROSE 5 % IV SOLN
10.00 mmol | Freq: Once | INTRAVENOUS | Status: AC
Start: 2019-10-01 — End: 2019-10-01
  Administered 2019-10-01: 13:00:00 10 mmol via INTRAVENOUS
  Filled 2019-10-01: qty 3.33

## 2019-10-01 MED ORDER — INSULIN REGULAR 100 UNITS IN 100 ML NS (PREMIX)
0.00 [IU]/h | INTRAVENOUS | Status: DC
Start: 2019-10-01 — End: 2019-10-02
  Administered 2019-10-01: 12:00:00 3 [IU]/h via INTRAVENOUS
  Filled 2019-10-01: qty 100

## 2019-10-01 MED ORDER — INSULIN REGULAR HUMAN 100 UNIT/ML IJ SOLN (IV ONLY)
5.00 [IU] | Freq: Once | Status: AC
Start: 2019-10-01 — End: 2019-10-01
  Administered 2019-10-01: 03:00:00 5 [IU] via INTRAVENOUS
  Filled 2019-10-01: qty 15

## 2019-10-01 MED FILL — Ketamine HCl Inj 100 MG/ML: INTRAMUSCULAR | Qty: 5 | Status: AC

## 2019-10-01 NOTE — UM Notes (Signed)
inp ref# [id number]      [facility] Utilization Review  NPI # [id number]  Tax ID [id number]  Please call Keith Gates at [phone], [phone], or e-mail at [email or web address] with any questions or concerns.   Fax final authorization and request for additional information to [phone].    IP admission 09/30/19 for Cardiac Arrest    Pt to ED male with cardiac arrest, ROSC per EMS.  EMS states pt appeared unwell was bradycardic and then quickly went to asystole on their arrival. Describe about 6 minutes of ACLS care until ROSC    VS: Temp 96.4, HR 54, RR 20, BP 142/96  Became hypotensive 79/61    Abn labs  WBC 19.29  H/H 10.8 & 35.5  Glucose 225  BUN 78.3  Creat 4.1  Chloride 114  CO2 <6  AST 53  ALT 65    CXR: Bilateral infiltrates suspicious for pulmonary edema.     ED meds  Epinephrine IV  Atropine IV x 3  Epinephrine IV gtt  Dobutrex IV gtt    ========================Admit to ICU inpatient  Orders  NPO  Nephrology consult  Rewarming procedure  Neuro checks Q1 hr  NGT  VS Q1 hr    Current medications  Scheduled Meds:  Current Facility-Administered Medications   Medication Dose Route Frequency    [START ON 10/02/2019] famotidine  20 mg Intravenous Q24H SCH    heparin (porcine)  5,000 Units Subcutaneous Q12H [facility]    meropenem  500 mg Intravenous Q8H    sodium chloride  1,000 mL Intravenous Once    sodium phosphate IVPB  10 mmol Intravenous Once     Continuous Infusions:   IV fluids with sodium bicarbonate 100 mL/hr (10/01/19 1010)    DOBUTamine (DOBUTREX) 2 mg/mL IVPB 250 mL 8 mcg/kg/min (10/01/19 9563)    fentaNYL 75 mcg/hr (10/01/19 1034)    insulin regular      midazolam 6 mg/hr (10/01/19 1035)    norepinephrine (LEVOPHED) infusion 22 mcg/min (10/01/19 8756)       Cardiology consult  Assessment:    Cardiac arrest with complete heart block   CAD status post CABG   LVEF 20% by echo April 2019    Recommendations:    Case was discussed with Dr. Julius Gates and electrophysiologist  Dr. Nedra Gates.   Recommendation at this time is for a temporary pacemaker wire which Dr. Nedra Gates will perform.  Eventually he may need a left heart catheterization, but we will plan to stabilize first and reevaluate after the temporary wire is complete.   Due to ongoing efforts for CPR, would avoid IV heparin for now.   Also due to bradycardia, would avoid cooling as he would be excluded from this protocol with his rhythm instability.   Admit patient to intensive care unit.   Risk of death is high.      BRIEF OP NOTE    Date Time: 09/30/19 3:42 PM    Patient Name:   Keith Gates    Date of Operation:   09/30/2019    Providers Performing:   Surgeon(s):  Keith Fredrickson, MD    Assistant (s):   * No surgical staff found *    Operative Procedure:   Procedure(s):  TEMPORARY PACER INSERTION    Preoperative Diagnosis:   Pre-Op Diagnosis Codes:     * Heart block [I45.9]    Postoperative Diagnosis:   Post-Op Diagnosis Codes:     * Heart block [I45.9]    Anesthesia:  Conscious Sedation    Estimated Blood Loss:    * No values recorded between 09/30/2019 12:00 AM and 09/30/2019  3:42 PM *    Implants:   * No implants in log *      Specimens:   * No specimens in log *      Findings:   - Complete heart block with no escape noted  - Placement of temporary transvenous pacer wire in the RV apex from right femoral vein    Complications:   - None      Signed by: Keith Fredrickson, MD                                                                           [facility] CARDIAC CATH/EP      ICU attending  Assessment:   This patient with diabetes, hypertension, hypercholesterolemia, prior CVA, prior CABG earlier this year, presents with cardiorespiratory arrest.  He has leukocytosis, pyuria, acute renal failure, hyperglycemia and severe metabolic acidosis.  Troponin was initially normal.  Clinical picture may be 1 of cardiorespiratory arrest due to septic shock.  Differential diagnosis also includes cardiac arrhythmia.    Plan:    Ventilatory support, hemodynamic support, fluid electrolyte management.  Intravenous insulin infusion, broad-spectrum antibiotics.  Trend cardiac enzymes.  Given neurologic unresponsiveness and presentation with cardiorespiratory arrest well hydrated, no rash therapeutic hypothermia.  Giving present need for hemodynamic inotropic support, will aim at a temperature of 36 degrees to avoid further hemodynamic instability.  I discussed the case with the patient's wife.  Prognosis is guarded.

## 2019-10-01 NOTE — Consults (Signed)
[place] [facility]  EP CONSULTATION REPORT  [facility]        Date Time: 10/01/19 4:54 PM Patient Name: Keith Gates  Requesting Physician: Durward Fortes, MD  Medical Record #:  [id number]  Account#:  [id number]  Admission Date:  09/30/2019        Assessment:    History of CAD status post CABG   Cardiac arrest, with subsequent complete heart block in the setting of metabolic derangements   Ischemic cardiomyopathy with reduced ejection fraction of 20%    Recommendations:    Unclear etiology of arrest, but it is unlikely that complete heart block was the primary cause of the arrest.   Complete heart block is now resolving with supportive care.   Would maintain pacer wire as patient is being rewarmed.    Reason for Consultation:   Complete heart block, now status post transvenous pacer wire    History:   Keith Gates is a 63 y.o. male admitted on 09/30/2019.  We have been asked by Durward Fortes, MD,  to provide EP consultation, regarding complete heart block, status post transvenous pacer wire.    63 year old male with history of diabetes, coronary artery disease status post CABG who had been found down at home, requiring call to EMS resulting in CPR being performed.  There was some concern for possible ST elevation, although this is unclear with no strips noted.  He was brought to the emergency department, and was noted to be in complete heart block.  A transvenous pacer wire was placed through the right femoral vein into the right ventricular apex.  He is being cooled to 36 degrees and is set to start rewarming later tonight.    His pacemaker was set to 70 bpm.  This morning, he has gradually out-competed the set pacemaker rate with presumably a sinus mechanism as he has gradually decreased his pacing requirements.  His neurologic status remains uncertain as he is being rewarmed.  Past Medical History:     Past Medical History:   Diagnosis Date    Diabetes mellitus      Elevated cholesterol     Hypertension     Stroke        Past Surgical History:     Past Surgical History:   Procedure Laterality Date    CORONARY ARTERY BYPASS GRAFT  01/2018    TEMPORARY PACER INSERTION N/A 09/30/2019    Procedure: TEMPORARY PACER INSERTION;  Surgeon: Acie Fredrickson, MD;  Location: [facility] CARDIAC CATH/EP;  Service: Cardiovascular;  Laterality: N/A;       Family History:   History reviewed. No pertinent family history.    Social History:     Social History     Socioeconomic History    Marital status: Married     Spouse name: Not on file    Number of children: Not on file    Years of education: Not on file    Highest education level: Not on file   Occupational History    Not on file   Social Needs    Financial resource strain: Not on file    Food insecurity     Worry: Not on file     Inability: Not on file    Transportation needs     Medical: Not on file     Non-medical: Not on file   Tobacco Use    Smoking status: Former Smoker     Years: 30.00  Quit date: 10/17/2002     Years since quitting: 16.9    Smokeless tobacco: Never Used   Substance and Sexual Activity    Alcohol use: Yes    Drug use: No    Sexual activity: Not on file   Lifestyle    Physical activity     Days per week: Not on file     Minutes per session: Not on file    Stress: Not on file   Relationships    Social connections     Talks on phone: Not on file     Gets together: Not on file     Attends religious service: Not on file     Active member of club or organization: Not on file     Attends meetings of clubs or organizations: Not on file     Relationship status: Not on file    Intimate partner violence     Fear of current or ex partner: Not on file     Emotionally abused: Not on file     Physically abused: Not on file     Forced sexual activity: Not on file   Other Topics Concern    Not on file   Social History Narrative    Not on file       Allergies:   No Known Allergies    Medications:     Medications Prior to  Admission   Medication Sig    atorvastatin (LIPITOR) 40 MG tablet Take 40 mg by mouth daily    metFORMIN (GLUCOPHAGE) 1000 MG tablet Take 1,000 mg by mouth 2 (two) times daily with meals    midodrine (PROAMATINE) 5 MG tablet Take 5 mg by mouth 2 (two) times daily with meals    SITagliptin (JANUVIA) 100 MG tablet Take 100 mg by mouth daily         Current Facility-Administered Medications   Medication Dose Route Frequency Provider Last Rate Last Admin    dextrose 5 % 1,000 mL with sodium bicarbonate 150 mEq infusion  100 mL/hr Intravenous Continuous Mendiguren, Ignacio I, MD 100 mL/hr at 10/01/19 1010 100 mL/hr at 10/01/19 1010    dextrose 50 % bolus 12.5 g  12.5 g Intravenous PRN Lawernce Ion, MD        DOBUTamine (DOBUTREX) 2 mg/mL in dextrose 5 % 250 mL infusion  2-20 mcg/kg/min Intravenous Continuous Toni Arthurs, DO 19.34 mL/hr at 10/01/19 1439 8 mcg/kg/min at 10/01/19 1439    [START ON 10/02/2019] famotidine (PEPCID) injection 20 mg  20 mg Intravenous Q24H [facility] Lawernce Ion, MD        fentaNYL (PF) (SUBLIMAZE) injection 50 mcg  50 mcg Intravenous Q1H PRN Mendiguren, Ignacio I, MD        fentaNYL (PF) (SUBLIMAZE) injection 50 mcg  50 mcg Intravenous Q1H PRN Mendiguren, Ignacio I, MD        fentaNYL 1000 mcg in sodium chloride 0.9% infusion 100 mL  50 mcg/hr Intravenous Continuous Toni Arthurs, DO 7.5 mL/hr at 10/01/19 1034 75 mcg/hr at 10/01/19 1034    heparin (porcine) injection 5,000 Units  5,000 Units Subcutaneous Q12H [facility] Mendiguren, Ignacio I, MD   5,000 Units at 09/30/19 2103    insulin regular 100 units/100 mL in sodium chloride 0.9% infusion (premix)  0-50 Units/hr Intravenous Continuous Lawernce Ion, MD 3 mL/hr at 10/01/19 1515 3 Units/hr at 10/01/19 1515    meropenem (MERREM) 500 mg in sodium chloride 0.9 % 100 mL  IVPB mini-bag plus  500 mg Intravenous Q12H Mendiguren, Ignacio I, MD        midazolam (VERSED) 100 mg in sodium chloride 0.9% 100 mL infusion SOLN  2-10 mg/hr  Intravenous Continuous Mendiguren, Ignacio I, MD 8 mL/hr at 10/01/19 1616 8 mg/hr at 10/01/19 1616    midazolam (VERSED) injection 2 mg  2 mg Intravenous Q1H PRN Lawernce Ion, MD   2 mg at 10/01/19 1631    norepinephrine (LEVOPHED) 8 mg in dextrose 5% 100 mL infusion  0-300 mcg/min Intravenous Continuous Mendiguren, Ignacio I, MD 16.5 mL/hr at 10/01/19 0956 22 mcg/min at 10/01/19 0956    sodium chloride 0.9 % bolus 1,000 mL  1,000 mL Intravenous Once Mendiguren, Ignacio I, MD        sodium phosphates 10 mmol in dextrose 5 % 250 mL IVPB  10 mmol Intravenous Once Lawernce Ion, MD 62.5 mL/hr at 10/01/19 1319 10 mmol at 10/01/19 1319         Review of Systems:    Comprehensive review of systems unable to be obtained due to clinical status  Physical Exam:     VITAL SIGNS PHYSICAL EXAM   Vitals:    10/01/19 1522   BP:    Pulse:    Resp:    Temp:    SpO2: 100%     Temp (24hrs), Avg:95.5 F (35.3 C), Min:93 F (33.9 C), Max:96.8 F (36 C)      Intake and Output Summary (Last 24 hours) at Date Time    Intake/Output Summary (Last 24 hours) at 10/01/2019 1654  Last data filed at 10/01/2019 1637  Gross per 24 hour   Intake 4010.96 ml   Output 1400 ml   Net 2610.96 ml        Physical Exam  General: Intubated, sedated  Head: normocephalic  Eyes: EOM's intact  Cardiovascular: RRR  Neck: no carotid bruits or JVD  Lungs: Intubated, coarse breath sounds  Abdomen: soft, non-tender, non-distended; normoactive bowel sounds  Extremities: no edema  Pulse: Bilateral radial pulses 2+   Neurological: Intubated/sedated  Psych:  Intubated, sedated  Skin: clean, dry, no masses or lesions.       Labs Reviewed:     Recent Labs   Lab 10/01/19  0903 10/01/19  0253 09/30/19  1412   Troponin I 0.93* 1.09* 0.03             Recent Labs   Lab 10/01/19  0208   Bilirubin, Total 0.6   Protein, Total 6.2   Albumin 3.1*   ALT 66*   AST (SGOT) 52*     Recent Labs   Lab 10/01/19  1509   Magnesium 2.0     Recent Labs   Lab 10/01/19  1509  09/30/19  1411    PT 15.2*  More results in Results Review 14.3   PT INR 1.2*  More results in Results Review 1.1   PTT  --   --  29   More results in Results Review = values in this interval not displayed.     Recent Labs   Lab 10/01/19  1509 10/01/19  0253 09/30/19  1809   WBC 15.37* 17.68* 16.70*   Hgb 10.2* 11.8* 11.5*   Hematocrit 29.5* 36.2* 35.7*   Platelets 113* 113* 112*     Recent Labs   Lab 10/01/19  1509 10/01/19  0903 10/01/19  0253   Sodium 141 137 139   Potassium 4.1 5.4* 6.9*  Chloride 108 106 109   CO2 22 18* 13*   BUN 66.7* 71.0* 77.5*   Creatinine 2.8* 3.1* 3.5*   EGFR 23.1 20.5 17.9   Glucose 180* 311* 349*   Calcium 8.3* 8.4* 8.9           Diagnostics   EKG:  Likely sinus tachycardia with prolonged PR interval    Telemetry:  Sinus tachycardia that eventually out-competes transvenous pacer wire    Signed by:     Acie Fredrickson, MD  Cardiac Electrophysiology  [facility]    [facility] Electrophysiology APP Spectralink [phone]   [facility] Cardiology APP Spectralink [phone] (8am-5pm M-F)  [facility] Electrophysiology APP Spectralink [phone] (8am-4:30pm M-F)  After hours, non urgent consult line [phone]  After Hours, urgent consults [phone] (Answering Service, will page on call MD)  You can also reach [place] 24/7 through our main EP office [phone].

## 2019-10-01 NOTE — Progress Notes (Signed)
Patient reached rewarming goal of 36 degrees celsius @ 0011.

## 2019-10-01 NOTE — Consults (Signed)
[facility]  NEPHROLOGY CONSULTATION NOTE    Date Time: 10/01/19 3:43 PM  Patient Name: KAIDON, CASTELLANO  MRN#: [id number]  DOB: [birth year]  Requesting Physician: Durward Fortes, MD      Reason for Consultation:   ARF on CKD II    Assessment:   ARF  CKD II  Hyperkalemia  Acidosis  HypoPO4  Anemia  HTN    Plan:   - Renal: pt with ARF on CKD II. His CKD with proteinuria is due to his underlying DM and HTN. His ARF is due to ATN by his cardiac arrest causing hemodynamic instability. His renal function, sCr and UOP is improving with the current management. I will order some urine labs and a renal US and continue his current IVF regimen for now and follow his renal function closely with you. Please avoid nephrotoxins like ACEI/ARB, NSAIDs and contrast as much as possible.    - Hyperkalemia: severe, due to ARF but improving with conservative management. I will follow his levels closely and continue the current management.     - Acidosis: due to ARF and lactic acidosis. I will continue the iv bicarb drip for one more day and then most likely stop in AM and follow his levels.     - HypoPO4: he was hyperPO4 on admission but she is now hypoPO4. I will follow his levels and continue the sodium phosphate supplementation.     - Anemia: I will check an anemia panel.    - HTN: BP is low requiring pressors and inotropes. Will continue the current management and try to maintain MAP>65.       Thank you very much Dr. Nedra Hai for consulting [place] on the care of this very pleasant patient.       History:   Espen Lorincz is a 63 y.o. male who presents to the hospital on 09/30/2019 after cardiac arrest requiring 6 minutes of resuscitation on site by EMS. The pt has a h/o controlled DM, controlled HTN and CKD stage II with a baseline sCr in the low 1's and proteinuria. He has also a h/o CAD s/p CABG earlier 2020.   He was apparently c/o generalized weakness and SOB for 1-2 days and then was found unresponsive and  bradycardic by his wife on the bed. EMS was called who found him to be bradycardic and he went into asystole. CPR was done for 6 minutes. He regained pulse and was intubated and admitted to the ICU. On admission his sCr was 4.1 and his K peaked at 7.3. His glucose was 392. He was treated medically with with pressors for low BP and dobutamine drip and also iv bicarb for his acidosis and his sCr is now better at 3.1 and his K was 5.4. His CXR shows clear lungs.     When I saw the pt he was intubated and sedated on pressors unable to give any history or ROS.       Past Medical History:     Past Medical History:   Diagnosis Date    Diabetes mellitus     Elevated cholesterol     Hypertension     Stroke        Past Surgical History:     Past Surgical History:   Procedure Laterality Date    CORONARY ARTERY BYPASS GRAFT  01/2018    TEMPORARY PACER INSERTION N/A 09/30/2019    Procedure: TEMPORARY PACER INSERTION;  Surgeon: Acie Fredrickson, MD;  Location: [facility]EP;  Service: Cardiovascular;  Laterality: N/A;       Family History:   History reviewed. No pertinent family history.    Social History:     Social History     Socioeconomic History    Marital status: Married     Spouse name: Not on file    Number of children: Not on file    Years of education: Not on file    Highest education level: Not on file   Occupational History    Not on file   Social Needs    Financial resource strain: Not on file    Food insecurity     Worry: Not on file     Inability: Not on file    Transportation needs     Medical: Not on file     Non-medical: Not on file   Tobacco Use    Smoking status: Former Smoker     Years: 30.00     Quit date: 10/17/2002     Years since quitting: 16.9    Smokeless tobacco: Never Used   Substance and Sexual Activity    Alcohol use: Yes    Drug use: No    Sexual activity: Not on file   Lifestyle    Physical activity     Days per week: Not on file     Minutes per session: Not on file    Stress:  Not on file   Relationships    Social connections     Talks on phone: Not on file     Gets together: Not on file     Attends religious service: Not on file     Active member of club or organization: Not on file     Attends meetings of clubs or organizations: Not on file     Relationship status: Not on file    Intimate partner violence     Fear of current or ex partner: Not on file     Emotionally abused: Not on file     Physically abused: Not on file     Forced sexual activity: Not on file   Other Topics Concern    Not on file   Social History Narrative    Not on file       Allergies:   No Known Allergies    Medications:     Current Facility-Administered Medications   Medication Dose Route Frequency    [START ON 10/02/2019] famotidine  20 mg Intravenous Q24H SCH    heparin (porcine)  5,000 Units Subcutaneous Q12H SCH    meropenem  500 mg Intravenous Q12H    sodium chloride  1,000 mL Intravenous Once    sodium phosphate IVPB  10 mmol Intravenous Once       Review of Systems:   As per HPI, all other systems were reviewed and were negative.    Physical Exam:     Vitals:    10/01/19 1522   BP:    Pulse:    Resp:    Temp:    SpO2: 100%       Intake and Output Summary (Last 24 hours) at Date Time    Intake/Output Summary (Last 24 hours) at 10/01/2019 1543  Last data filed at 10/01/2019 1524  Gross per 24 hour   Intake 3756.76 ml   Output 1275 ml   Net 2481.76 ml       General appearance - alert, well appearing, and in no distress  Mental status - alert, oriented to person, place, and time  Eyes - pupils equal and reactive, extraocular eye movements intact  Mouth - mucous membranes moist, pharynx normal without lesions  Neck - supple, no significant adenopathy  Lymphatics - no palpable lymphadenopathy, no hepatosplenomegaly  Chest - clear to auscultation, no wheezes, rales or rhonchi, symmetric air entry  Heart - normal rate, regular rhythm, normal S1, S2, no murmurs, rubs, clicks or gallops  Abdomen - soft,  nontender, nondistended, no masses or organomegaly  Neurological - alert, oriented, normal speech, no focal findings or movement disorder noted  Musculoskeletal - no joint tenderness, deformity or swelling  Extremities - peripheral pulses normal, no pedal edema, no clubbing or cyanosis  Skin - normal coloration and turgor, no rashes, no suspicious skin lesions noted    Labs Reviewed:     Results     Procedure Component Value Units Date/Time    Calcium, ionized [[id number]] Collected: 10/01/19 1509    Specimen: Blood Updated: 10/01/19 1542     Calcium, Ionized 2.46 mEq/L     CBC without differential [[id number]]  (Abnormal) Collected: 10/01/19 1509    Specimen: Blood Updated: 10/01/19 1542     WBC 15.37 x10 3/uL      Hgb 10.2 g/dL      Hematocrit 78.2 %      Platelets 113 x10 3/uL      RBC 3.45 x10 6/uL      MCV 85.5 fL      MCH 29.6 pg      MCHC 34.6 g/dL      RDW 13 %      MPV 12.4 fL      Nucleated RBC 0.0 /100 WBC      Absolute NRBC 0.00 x10 3/uL     Prothrombin time/INR [[id number]] Collected: 10/01/19 1509    Specimen: Blood Updated: 10/01/19 1539    Basic Metabolic Panel [[id number]] Collected: 10/01/19 1509    Specimen: Blood Updated: 10/01/19 1539    Phosphorus [[id number]] Collected: 10/01/19 1509    Specimen: Blood Updated: 10/01/19 1539    Magnesium [[id number]] Collected: 10/01/19 1509    Specimen: Blood Updated: 10/01/19 1539    Glucose Whole Blood - POCT [[id number]]  (Abnormal) Collected: 10/01/19 1512     Updated: 10/01/19 1537     Whole Blood Glucose POCT 178 mg/dL     Glucose Whole Blood - POCT [[id number]]  (Abnormal) Collected: 10/01/19 1405     Updated: 10/01/19 1453     Whole Blood Glucose POCT 211 mg/dL     EPPIR-51 (SARS-CoV-2) [[id number]] Collected: 09/30/19 1820     Updated: 10/01/19 1336     Purpose of COVID testing Diagnostic -PUI     SARS-CoV-2 Specimen Source Nasopharyngeal     SARS CoV 2 Overall Result Not Detected    Narrative:      o Collect and clearly label specimen type:  o  PREFERRED-Upper respiratory specimen: One Nasopharyngeal  Swab in Transport Media.  o Hand deliver to laboratory ASAP    Glucose Whole Blood - POCT [[id number]]  (Abnormal) Collected: 10/01/19 1310     Updated: 10/01/19 1327     Whole Blood Glucose POCT 195 mg/dL     Glucose Whole Blood - POCT [[id number]]  (Abnormal) Collected: 10/01/19 1214     Updated: 10/01/19 1219     Whole Blood Glucose POCT 227 mg/dL     Glucose Whole Blood - POCT [[id number]]  (Abnormal) Collected: 10/01/19 1210     Updated:  10/01/19 1219     Whole Blood Glucose POCT 197 mg/dL     Glucose Whole Blood - POCT [[id number]]  (Abnormal) Collected: 10/01/19 1133     Updated: 10/01/19 1219     Whole Blood Glucose POCT 248 mg/dL     CULTURE BLOOD AEROBIC AND ANAEROBIC [[id number]] Collected: 10/01/19 0253    Specimen: Blood, Venipuncture Updated: 10/01/19 1212    Narrative:      The order will result in two separate 8-67ml bottles  Please do NOT order repeat blood cultures if one has been  drawn within the last 48 hours  UNLESS concerned for  endocarditis  AVOID BLOOD CULTURE DRAWS FROM CENTRAL LINE IF POSSIBLE  Indications:->Other  Other->Admission  Rescheduled by [id number] at 10/01/2019 02:09 Reason: Printed by   mistake/Printing Issues.  LEFT WRIST  1 BLUE+1 PURPLE    CULTURE BLOOD AEROBIC AND ANAEROBIC [[id number]] Collected: 10/01/19 0254    Specimen: Blood, Venipuncture Updated: 10/01/19 1156    Narrative:      The order will result in two separate 8-105ml bottles  Please do NOT order repeat blood cultures if one has been  drawn within the last 48 hours  UNLESS concerned for  endocarditis  AVOID BLOOD CULTURE DRAWS FROM CENTRAL LINE IF POSSIBLE  Indications:->Other  Other->Admission  Rescheduled by [id number] at 10/01/2019 02:09 Reason: Printed by   mistake/Printing Issues.  LEFT HAND  1 BLUE+1 PURPLE    Troponin I [[id number]]  (Abnormal) Collected: 10/01/19 0903     Updated: 10/01/19 1055     Troponin I 0.93 ng/mL     Glucose Whole Blood - POCT [[id number]]   (Abnormal) Collected: 10/01/19 0854     Updated: 10/01/19 1014     Whole Blood Glucose POCT 283 mg/dL     Prothrombin time/INR [[id number]]  (Abnormal) Collected: 10/01/19 0903    Specimen: Blood Updated: 10/01/19 1002     PT 15.0 sec      PT INR 1.2    Glucose Whole Blood - POCT [[id number]]  (Abnormal) Collected: 10/01/19 0952     Updated: 10/01/19 0955     Whole Blood Glucose POCT 289 mg/dL     GFR [[id number]] Collected: 10/01/19 0903     Updated: 10/01/19 0942     EGFR 20.5    Basic Metabolic Panel [[id number]]  (Abnormal) Collected: 10/01/19 0903    Specimen: Blood Updated: 10/01/19 0942     Glucose 311 mg/dL      BUN 01.7 mg/dL      Creatinine 3.1 mg/dL      Calcium 8.4 mg/dL      Sodium 510 mEq/L      Potassium 5.4 mEq/L      Chloride 106 mEq/L      CO2 18 mEq/L      Anion Gap 13.0    Magnesium [[id number]] Collected: 10/01/19 0903    Specimen: Blood Updated: 10/01/19 0942     Magnesium 1.7 mg/dL     Phosphorus [[id number]]  (Abnormal) Collected: 10/01/19 0903    Specimen: Blood Updated: 10/01/19 0942     Phosphorus 1.3 mg/dL     Calcium, ionized [[id number]] Collected: 10/01/19 0903    Specimen: Blood Updated: 10/01/19 0936     Calcium, Ionized 2.40 mEq/L     Lactic Acid [[id number]]  (Abnormal) Collected: 10/01/19 0903    Specimen: Blood Updated: 10/01/19 0930     Lactic Acid 3.5 mmol/L     Arterial blood gas [[id number]]  (Abnormal) Collected: 10/01/19 0911  Updated: 10/01/19 0921     Status Oxygen     ABG CollectionSite Left Radl     Allen's Test No PT NOT ABLE     Temperature 97.0     ARTERIAL PH CORRECTED 7.46     ARTERIAL PCO2 CORRECTED 30.0 mmHg      Arterial pO2 Corrected 189.0 mmHg      pH, Arterial 7.45     pCO2, Arterial 31.0 mmHg      pO2, Arterial 194.0 mmHg      HCO3, Arterial 21.5 mEq/L      Base Excess, Arterial -1.6 mEq/L      O2 Sat, Arterial 99.7 %      FIO2 50.0 %      Rate 18 BPM      Tidal vol. 450     PEEP 5    Narrative:      Rescheduled by [id number] at 10/01/2019 09:11 Reason: Patient  unavailable    Glucose Whole Blood - POCT [[id number]]  (Abnormal) Collected: 10/01/19 0757     Updated: 10/01/19 0822     Whole Blood Glucose POCT 280 mg/dL     Glucose Whole Blood - POCT [[id number]]  (Abnormal) Collected: 10/01/19 0607     Updated: 10/01/19 0707     Whole Blood Glucose POCT 302 mg/dL     Glucose Whole Blood - POCT [[id number]]  (Abnormal) Collected: 10/01/19 0656     Updated: 10/01/19 0707     Whole Blood Glucose POCT 293 mg/dL     Prothrombin time/INR [[id number]]  (Abnormal) Collected: 10/01/19 0412    Specimen: Blood Updated: 10/01/19 0525     PT 15.0 sec      PT INR 1.2    Glucose Whole Blood - POCT [[id number]]  (Abnormal) Collected: 10/01/19 0452     Updated: 10/01/19 0514     Whole Blood Glucose POCT 325 mg/dL     Calcium, ionized [[id number]] Collected: 10/01/19 0412    Specimen: Blood Updated: 10/01/19 0434     Calcium, Ionized 2.39 mEq/L     Glucose Whole Blood - POCT [[id number]]  (Abnormal) Collected: 10/01/19 0406     Updated: 10/01/19 0426     Whole Blood Glucose POCT 328 mg/dL     Phosphorus [[id number]] Collected: 10/01/19 0253    Specimen: Blood Updated: 10/01/19 0410     Phosphorus 2.8 mg/dL     Narrative:      Rescheduled by [id number] at 10/01/2019 02:09 Reason: Printed by   mistake/Printing Issues.    GFR [[id number]] Collected: 10/01/19 0253     Updated: 10/01/19 0410     EGFR 17.9    Narrative:      Rescheduled by [id number] at 10/01/2019 02:09 Reason: Printed by   mistake/Printing Issues.    Basic Metabolic Panel [[id number]]  (Abnormal) Collected: 10/01/19 0253    Specimen: Blood Updated: 10/01/19 0410     Glucose 349 mg/dL      BUN 35.5 mg/dL      Creatinine 3.5 mg/dL      Calcium 8.9 mg/dL      Sodium 732 mEq/L      Potassium 6.9 mEq/L      Chloride 109 mEq/L      CO2 13 mEq/L      Anion Gap 17.0    Narrative:      Rescheduled by [id number] at 10/01/2019 02:09 Reason: Printed by   mistake/Printing Issues.    Magnesium [[id number]] Collected: 10/01/19 0253    Specimen: Blood Updated: 10/01/19  0410     Magnesium 1.9 mg/dL     Narrative:      Rescheduled by [id number] at 10/01/2019 02:09 Reason: Printed by   mistake/Printing Issues.    Troponin I [[id number]]  (Abnormal) Collected: 10/01/19 0253    Specimen: Blood Updated: 10/01/19 0409     Troponin I 1.09 ng/mL     Narrative:      Rescheduled by [id number] at 10/01/2019 02:09 Reason: Printed by   mistake/Printing Issues.    Glucose Whole Blood - POCT [[id number]]  (Abnormal) Collected: 10/01/19 0259     Updated: 10/01/19 0346     Whole Blood Glucose POCT 321 mg/dL     CBC and differential [[id number]]  (Abnormal) Collected: 10/01/19 0253    Specimen: Blood Updated: 10/01/19 0310     WBC 17.68 x10 3/uL      Hgb 11.8 g/dL      Hematocrit 64.4 %      Platelets 113 x10 3/uL      RBC 4.08 x10 6/uL      MCV 88.7 fL      MCH 28.9 pg      MCHC 32.6 g/dL      RDW 13 %      MPV 12.5 fL      Neutrophils 87.1 %      Lymphocytes Automated 4.2 %      Monocytes 8.0 %      Eosinophils Automated 0.1 %      Basophils Automated 0.1 %      Immature Granulocytes 0.5 %      Nucleated RBC 0.0 /100 WBC      Neutrophils Absolute 15.41 x10 3/uL      Lymphocytes Absolute Automated 0.74 x10 3/uL      Monocytes Absolute Automated 1.41 x10 3/uL      Eosinophils Absolute Automated 0.01 x10 3/uL      Basophils Absolute Automated 0.02 x10 3/uL      Immature Granulocytes Absolute 0.09 x10 3/uL      Absolute NRBC 0.00 x10 3/uL     Narrative:      Rescheduled by [id number] at 10/01/2019 02:09 Reason: Printed by   mistake/Printing Issues.    Phosphorus [[id number]] Collected: 10/01/19 0208    Specimen: Blood Updated: 10/01/19 0242     Phosphorus 2.3 mg/dL     GFR [[id number]] Collected: 10/01/19 0208     Updated: 10/01/19 0242     EGFR 18.5    Comprehensive metabolic panel [[id number]]  (Abnormal) Collected: 10/01/19 0208    Specimen: Blood Updated: 10/01/19 0242     Glucose 385 mg/dL      BUN 16.0 mg/dL      Creatinine 3.4 mg/dL      Sodium 109 mEq/L      Potassium 6.5 mEq/L      Chloride 109 mEq/L      CO2 15  mEq/L      Calcium 8.4 mg/dL      Protein, Total 6.2 g/dL      Albumin 3.1 g/dL      AST (SGOT) 52 U/L      ALT 66 U/L      Alkaline Phosphatase 86 U/L      Bilirubin, Total 0.6 mg/dL      Globulin 3.1 g/dL      Albumin/Globulin Ratio 1.0     Anion Gap 15.0    Magnesium [[id number]] Collected: 10/01/19 0208    Specimen: Blood Updated: 10/01/19 0242     Magnesium  1.7 mg/dL     Glucose Whole Blood - POCT [[id number]]  (Abnormal) Collected: 10/01/19 0202     Updated: 10/01/19 0219     Whole Blood Glucose POCT 337 mg/dL     GFR [[id number]] Collected: 10/01/19 0024     Updated: 10/01/19 0155     EGFR 18.5    Basic Metabolic Panel [[id number]]  (Abnormal) Collected: 10/01/19 0024    Specimen: Blood Updated: 10/01/19 0155     Glucose 392 mg/dL      BUN 08.6 mg/dL      Creatinine 3.4 mg/dL      Calcium 8.5 mg/dL      Sodium 578 mEq/L      Potassium 7.3 mEq/L      Chloride 109 mEq/L      CO2 15 mEq/L      Anion Gap 12.0    Magnesium [[id number]] Collected: 10/01/19 0024    Specimen: Blood Updated: 10/01/19 0155     Magnesium 1.8 mg/dL     Phosphorus [[id number]] Collected: 10/01/19 0024    Specimen: Blood Updated: 10/01/19 0155     Phosphorus 2.9 mg/dL     Glucose Whole Blood - POCT [[id number]]  (Abnormal) Collected: 10/01/19 0112     Updated: 10/01/19 0130     Whole Blood Glucose POCT 351 mg/dL     Arterial blood gas [[id number]]  (Abnormal) Collected: 10/01/19 0104     Updated: 10/01/19 0117     Status Oxygen     ABG CollectionSite Left Radl     Allen's Test No PT NOT ABLE     Temperature 96.8     ARTERIAL PH CORRECTED 7.36     ARTERIAL PCO2 CORRECTED 28.0 mmHg      Arterial pO2 Corrected 184.0 mmHg      pH, Arterial 7.35     pCO2, Arterial 29.0 mmHg      pO2, Arterial 189.0 mmHg      HCO3, Arterial 16.0 mEq/L      Base Excess, Arterial -8.2 mEq/L      O2 Sat, Arterial 99.6 %      FIO2 50.0 %      Rate 18 BPM      Tidal vol. 450     PEEP 5    Calcium, ionized [[id number]] Collected: 10/01/19 0024    Specimen: Blood Updated:  10/01/19 0059     Calcium, Ionized 2.35 mEq/L     Prothrombin time/INR [[id number]] Collected: 10/01/19 0024    Specimen: Blood Updated: 10/01/19 0048     PT 14.2 sec      PT INR 1.1    Glucose Whole Blood - POCT [[id number]]  (Abnormal) Collected: 10/01/19 0021     Updated: 10/01/19 0035     Whole Blood Glucose POCT 361 mg/dL     Glucose Whole Blood - POCT [[id number]]  (Abnormal) Collected: 09/30/19 2240     Updated: 09/30/19 2305     Whole Blood Glucose POCT 330 mg/dL     CULTURE + Dierdre Forth [[id number]] Collected: 09/30/19 1752    Specimen: Sputum, Suctioned Updated: 09/30/19 2237    Narrative:      ORDER#: [id number]                                    ORDERED BY: MENDIGUREN, IGN  SOURCE: Sputum, Suctioned ETT  COLLECTED:  09/30/19 17:52  ANTIBIOTICS AT COLL.:                                RECEIVED :  09/30/19 21:24  Stain, Gram (Respiratory)                  FINAL       09/30/19 22:37  09/30/19   Moderate WBC's             Moderate Mixed Respiratory Flora             Few Squamous epithelial cells  Culture and Gram Stain, Aerobic, RespiratorPENDING      Lactic Acid [[id number]]  (Abnormal) Collected: 09/30/19 2029    Specimen: Blood Updated: 09/30/19 2124     Lactic Acid 5.5 mmol/L     Lactic Acid [[id number]]  (Abnormal) Collected: 09/30/19 1620    Specimen: Blood Updated: 09/30/19 1858     Lactic Acid 6.1 mmol/L     GFR [[id number]] Collected: 09/30/19 1809     Updated: 09/30/19 1855     EGFR 15.8    Comprehensive metabolic panel [[id number]]  (Abnormal) Collected: 09/30/19 1809    Specimen: Blood Updated: 09/30/19 1855     Glucose 332 mg/dL      BUN 62.9 mg/dL      Creatinine 3.9 mg/dL      Sodium 528 mEq/L      Potassium 5.4 mEq/L      Chloride 110 mEq/L      CO2 11 mEq/L      Calcium 9.7 mg/dL      Protein, Total 6.3 g/dL      Albumin 3.4 g/dL      AST (SGOT) 63 U/L      ALT 81 U/L      Alkaline Phosphatase 94 U/L      Bilirubin, Total 0.4 mg/dL      Globulin 2.9 g/dL       Albumin/Globulin Ratio 1.2     Anion Gap 20.0    Magnesium [[id number]] Collected: 09/30/19 1809    Specimen: Blood Updated: 09/30/19 1855     Magnesium 2.3 mg/dL     Phosphorus [[id number]]  (Abnormal) Collected: 09/30/19 1809    Specimen: Blood Updated: 09/30/19 1855     Phosphorus 5.6 mg/dL     Prothrombin time/INR [[id number]] Collected: 09/30/19 1809    Specimen: Blood Updated: 09/30/19 1850     PT 14.1 sec      PT INR 1.1    CBC without differential [[id number]]  (Abnormal) Collected: 09/30/19 1809    Specimen: Blood Updated: 09/30/19 1850     WBC 16.70 x10 3/uL      Hgb 11.5 g/dL      Hematocrit 29.5 %      Platelets 112 x10 3/uL      RBC 3.93 x10 6/uL      MCV 90.8 fL      MCH 29.3 pg      MCHC 32.2 g/dL      RDW 13 %      MPV 12.5 fL      Nucleated RBC 0.0 /100 WBC      Absolute NRBC 0.00 x10 3/uL     Calcium, ionized [[id number]] Collected: 09/30/19 1809    Specimen: Blood Updated: 09/30/19 1832     Calcium, Ionized 2.49 mEq/L     Urinalysis Reflex to Microscopic Exam [[id number]]  (Abnormal) Collected:  09/30/19 1752    Specimen: Urine Updated: 09/30/19 1815     Urine Type Catheterized, F     Color, UA Amber     Clarity, UA Turbid     Specific Gravity UA 1.018     Urine pH 5.0     Leukocyte Esterase, UA Large     Nitrite, UA Negative     Protein, UR 100     Glucose, UA Negative     Ketones UA 5     Urobilinogen, UA Normal mg/dL      Bilirubin, UA Negative     Blood, UA Negative     RBC, UA 0-2 /hpf      WBC, UA TNTC /hpf      Squamous Epithelial Cells, Urine 0-5 /hpf      WBC Clumps, UA Many /hpf     Arterial blood gas [[id number]]  (Abnormal) Collected: 09/30/19 1740     Updated: 09/30/19 1742     Status Oxygen     ABG CollectionSite Left Radl     Allen's Test Yes     Temperature 96.4     ARTERIAL PH CORRECTED 7.28     ARTERIAL PCO2 CORRECTED 28.0 mmHg      Arterial pO2 Corrected 228.0 mmHg      pH, Arterial 7.26     pCO2, Arterial 30.0 mmHg      pO2, Arterial 234.0 mmHg      HCO3, Arterial 13.5 mEq/L      Base  Excess, Arterial -12.2 mEq/L      O2 Sat, Arterial 99.8 %      FIO2 70.0 %      Rate 18 BPM      Tidal vol. 450     PEEP 5              Rads:   Radiological Procedure reviewed.   Xr Chest Ap Portable    Result Date: 09/30/2019  Lines in good position. Hypoventilation with left basilar atelectasis. No significant consolidation or pneumothorax. Charlott Rakes, MD  09/30/2019 10:56 PM

## 2019-10-01 NOTE — Consults (Signed)
INFECTIOUS DISEASE CONSULT  Alfonzo Beers, MD, FACP          Date Time: 10/01/19 5:47 PM  Patient Name: Keith Gates  Referring Physician: Thornton Papas, MD    Reason for consult:     Sepsis, UTI, possible pneumonia    History of present illness:     Keith Gates [id number] is a 63 y.o. male, whose history is obtained from the records.  According to the records he has a history of diabetes mellitus, hypertension, hyperlipidemia, stroke, coronary artery disease status post coronary artery bypass grafting who was admitted to the ICU after cardiac arrest and he underwent on field CPR and intubated and he had a CPR done again in the emergency room.  He was found of a complete heart block being followed by cardiology.  He is currently undergoing Arctic sun cooling protocol.  His urine is grossly infected and his sputum Gram stain is also showing some gram-negative rods.    Review of systems:     He is intubated and sedated, status post cardiac arrest and CPR.  No documentation of any hematemesis, hemoptysis, melena.  No documentation of any vomiting or diarrhea.  Tested negative for COVID-19.Other review of systems are noncontributory.    Allergies:     No Known Allergies    Past medical history:     Past Medical History:   Diagnosis Date    Diabetes mellitus     Elevated cholesterol     Hypertension     Stroke        Past surgical history:     Past Surgical History:   Procedure Laterality Date    CORONARY ARTERY BYPASS GRAFT  01/2018    TEMPORARY PACER INSERTION N/A 09/30/2019    Procedure: TEMPORARY PACER INSERTION;  Surgeon: Acie Fredrickson, MD;  Location: [facility];  Service: Cardiovascular;  Laterality: N/A;       Family history:     History reviewed. No pertinent family history.    Social history:     Social History     Substance and Sexual Activity   Alcohol Use Yes     Social History     Substance and Sexual Activity   Drug Use No     Social History     Tobacco Use   Smoking  Status Former Smoker    Years: 30.00    Quit date: 10/17/2002    Years since quitting: 16.9   Smokeless Tobacco Never Used       Medications:     Current Facility-Administered Medications   Medication Dose Route Frequency    [START ON 10/02/2019] famotidine  20 mg Intravenous Q24H SCH    heparin (porcine)  5,000 Units Subcutaneous Q12H SCH    meropenem  500 mg Intravenous Q12H    sodium chloride  1,000 mL Intravenous Once       Physical Exam:     Blood pressure 103/60, pulse 98, temperature (!) 96.8 F (36 C), temperature source Core, resp. rate (!) 0, height 1.803 m (5\' 11" ), weight 76.4 kg (168 lb 6.9 oz), SpO2 100 %.    General Appearance: Intubated, sedated.   HEENT: Pallor positive, Anicteric sclera.   Neck: Supple  Lungs:Decreased breath sound at bases.   Chest Wall: Symmetric chest wall expansion.   Heart : S1 and S2.   Abdomen: Abdomen is soft, bowel sounds positive.  Neurological: Intubated and sedated    Labs:     Recent Labs  10/01/19  1509 10/01/19  0253   WBC 15.37* 17.68*   Hgb 10.2* 11.8*   Hematocrit 29.5* 36.2*   Platelets 113* 113*   MCV 85.5 88.7       Recent Labs     10/01/19  1509 10/01/19  0903   Sodium 141 137   Potassium 4.1 5.4*   Chloride 108 106   CO2 22 18*   BUN 66.7* 71.0*   Creatinine 2.8* 3.1*   Glucose 180* 311*   Calcium 8.3* 8.4*   Magnesium 2.0 1.7   Phosphorus 1.5* 1.3*       Recent Labs     10/01/19  0208 09/30/19  1809   AST (SGOT) 52* 63*   ALT 66* 81*   Alkaline Phosphatase 86 94   Protein, Total 6.2 6.3   Albumin 3.1* 3.4*   Bilirubin, Total 0.6 0.4       Imaging studies:     Chest x-ray: Bilateral infiltrates suspicious for pulmonary edema.    Assessment :     Keith Gates is a 63 y.o. 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 Nedra Hai   CBC, CMP tomorrow   We'll adjust the antimicrobials according to the cultures and clinical course     I will follow this patient closely with you    Thank you Arline Asp for involving me in care of Keith Gates          Signed by: Alfonzo Beers, MD, FACP  Date Time: 10/01/19 5:47 PM      *This note was generated by the Epic EMR system/ Dragon speech recognition and may contain inherent errors or omissions not intended by the user. Grammatical errors, random word insertions, deletions, pronoun errors and incomplete sentences are occasional consequences of this technology due to software limitations. Not all errors are caught or corrected. If there are questions or concerns about the content of this note or information contained within the body of this dictation they should be addressed directly with the author for clarification

## 2019-10-01 NOTE — Progress Notes (Addendum)
10/01/19 0245   Provider Notification   Reason for Communication Critical lab value   Provider Name Jovita Gamma NP   Provider Role Consulting physician   Method of Communication Face to face   Response See orders   Notification Time 0251   Redraw showed a K+ of 6.5, and mag 1.7. Ordered 5 units regular insulin IV push. Rankin NP doesn't want to replace the Mag because it will increase the K+.  Lab was called during day shift for blood cultures to be drawn and then again at shift change. Blood cultures haven't been done called lab to draw the blood cultures since nursing staff was unsuccessful.

## 2019-10-01 NOTE — Progress Notes (Signed)
[facility]- Critical Care Note     ICU Daily Progress Note        Date Time: 10/01/19 10:59 AM  Patient Name: Keith Gates  Attending Physician: Durward Fortes, MD  Room: IC03/IC03-A   Admit Date: 09/30/2019  LOS: 1 day      Assessment/Plan:     #Neuro: h/o prior stroke, anoxic encephalopathy, undergoing Arctic sun.  Currently in cooling phase.    #pulm: Respiratory failure secondary to cardiac arrest.  Continue mechanical ventilation.  Not a candidate for breathing trial secondary to undergoing Arctic sun.    #cardiac: Cardiac arrest with complete heart block in the setting of sepsis and septic shock.   h/o NSTEMI 01/2018, with CABG 01/2019. Ischemic cardiomyopathy, EF 20-25%. HLD, complete heart block noted on admission.  Continue temporary pacing wire.  Appreciate [facility] evaluation recommendation.    #DM:  diabetes mellitus, check hemoglobin A1c for restratification.   Improved DKA, normalized anion gap.  Since glucose is uncontrolled, will change to critical care infusion protocol    #nutrition: npo, not candidate for tube feeds until patient is rewarmed.  Expected cooling induced ileus    # renal: acute renal failure, improved creatinine after iv hydration.    [facility]  Appreciate Dr. Kittie Plater,  nephrology evaluation recommendation.    #ID: + urinary tract infection, + urine analysis.  Sepsis, septic shock ,cultures sent and pending.  Continue with antibiotics  COVID pending      #Prophylaxis:   GI Prophylaxis:  +  VTE Prophylaxis:+      #Code Status: full code     Addendum:  3:30pm  Discussed with patient's wife and 2 children (son Kaman and daughter and daughter's life partner) with [place] translator via telephone. Of note, pt's children speak [place].  Wife speaks [place].  Went over patient's medical condition and therapy.  Informed them of Arctic sun, will need to await after cooling therapy to see if patient has improvement in neurological function.   Answered her questions.    Subjective:   63 year old male with history of diabetes, hypertension, prior stroke, coronary artery disease with coronary artery bypass graft 4/202 presents with cardiac arrest from complete heart block in the setting of sepsis and septic shock.    Intubated, sedated, undergoing Arctic sun cooling protocol older than stated age,    Medications:   Scheduled Meds:  Current Facility-Administered Medications   Medication Dose Route Frequency    calcium chloride  1 g Intravenous Once    famotidine  20 mg Intravenous Q12H SCH    heparin (porcine)  5,000 Units Subcutaneous Q12H [facility]    meropenem  500 mg Intravenous Q8H    sodium bicarbonate  100 mEq Intravenous Once    sodium chloride  1,000 mL Intravenous Once         Continuous Infusions:   IV fluids with sodium bicarbonate 100 mL/hr (10/01/19 1010)    DOBUTamine (DOBUTREX) 2 mg/mL IVPB 250 mL 8 mcg/kg/min (10/01/19 1610)    fentaNYL 75 mcg/hr (10/01/19 1034)    insulin regular 3 Units/hr (10/01/19 0657)    midazolam 6 mg/hr (10/01/19 1035)    norepinephrine (LEVOPHED) infusion 22 mcg/min (10/01/19 0956)        Physical Exam:     Vitals:    10/01/19 1000   BP: 123/65   Pulse: (!) 104   Resp: (!) 10   Temp:    SpO2: 100%  Intake/Output Summary (Last 24 hours) at 10/01/2019 1059  Last data filed at 10/01/2019 1013  Gross per 24 hour   Intake 2579.96 ml   Output 800 ml   Net 1779.96 ml       General Appearance: Intubated, older than stated age ,sedated, edentulous  CV: Regular rate  Pulm: No accessory use  Abd: Nondistended  Extremities: No edema    Data:     Vent Settings:    Vent Settings  Vent Mode: PRVC  FiO2: 40 %  Resp Rate (Set): 18  Vt (Set, mL): 450 mL  PIP Observed (cm H2O): 19 cm H2O  PEEP/EPAP: 5 cm H20  Mean Airway Pressure: 8 cmH20    Labs:       Labs (last 72 hours):  Recent Labs     10/01/19  0253 09/30/19  1809 09/30/19  1411   WBC 17.68* 16.70* 19.29*   Hgb 11.8* 11.5* 10.8*   Hematocrit 36.2* 35.7* 35.5*      Recent Labs     10/01/19  0903 10/01/19  0412 10/01/19  0024  09/30/19  1411   PT 15.0 15.0 14.2   < > 14.3   PT INR 1.2* 1.2* 1.1   < > 1.1   PTT  --   --   --   --  29    < > = values in this interval not displayed.    Recent Labs     10/01/19  0903 10/01/19  0253 10/01/19  0208   Sodium 137 139 139   Potassium 5.4* 6.9* 6.5*   Chloride 106 109 109   CO2 18* 13* 15*   BUN 71.0* 77.5* 76.9*   Creatinine 3.1* 3.5* 3.4*   Glucose 311* 349* 385*   Calcium 8.4* 8.9 8.4*   Magnesium 1.7 1.9 1.7   Phosphorus 1.3* 2.8 2.3               Rads:     Radiological Imaging personally reviewed,and agree with radiology report including:      CXR    09/30/2019  "Lines in good position. Hypoventilation with left basilar atelectasis.   No significant consolidation or pneumothorax. "    I have personally reviewed the patients history and 24 hour interval events, along with vitals, labs, radiology images and nursing. So far today I have spent _45__ minutes providing care for this patient excluding teaching and billable procedures, and not overlapping with any other providers.    Signed by: Lawernce Ion, MD  Date/Time: 10/01/19 10:59 AM        *Due to pandemic of coronavirus and based on hospital policies and procedures ; this document may contain, but not limited to, information based on direct patient contact, observation , telephone conversation, use of A/V devices , medical records, conversation with other treatment team members, information and physical exam by other treatment team members.  Not all errors are caught or corrected. If there are questions or concerns about the content of this note or information contained within the body of this document they should be addressed directly with the author for clarification.     *This note was generated by the Epic EMR system/ Dragon speech recognition and may contain inherent errors or omissions not intended by the user. Grammatical errors, random word insertions, deletions,  pronoun errors and incomplete sentences are occasional consequences of this technology due to software limitations. Not all errors are caught or corrected. If there are questions or concerns  about the content of this note or information contained within the body of this dictation they should be addressed directly with the author for clarification

## 2019-10-01 NOTE — Consults (Signed)
WOC Nurse Wound Consult:    Keith Gates is a 63 y.o. male admitted with heart block    Reason for consult: evaluation of wound LUQ abdomen    Past Medical History:   Diagnosis Date    Diabetes mellitus     Elevated cholesterol     Hypertension     Stroke      Past Surgical History:   Procedure Laterality Date    CORONARY ARTERY BYPASS GRAFT  01/2018    TEMPORARY PACER INSERTION N/A 09/30/2019    Procedure: TEMPORARY PACER INSERTION;  Surgeon: Acie Fredrickson, MD;  Location: [facility]/EP;  Service: Cardiovascular;  Laterality: N/A;     General assessment:    Mobility: intubated, sedated    Braden score: 12    Continence/Incontinence: Foley catheter    Bed: Progressa    Diet: NPO, NGT to LIS    Wound assessment:  Location: abdomen, left upper quadrant  Etiology: unknown  Wound base: small area of hypergranulation  Measurements: 0.2 cm x 0.2 cm , above skin level  Edges: attached  Periwound: intact  Exudate amount and type: scant serosanguinous drainage when cleansed  Odor: none  Pain: pt sedated, intubated  Tunneling/undermining: none  Exposed tendon/ligament, muscle, or bone: none      Plan:    Area of hypergranulation tissue of unknown etiology. Recommend application of non adherent dressing and foam over wound for protection and moist wound healing. Skin prep for protection of periwound skin.     Recommend turning/repositioning as medically appropriate, offloading patient's heels and use of TAP system for pressure ulcer prevention.    WOC team will continue to monitor patient for wound healing needs.    Lesly Rubenstein, RN, [facility]  Wound Care Coordinator  Spectra link # [phone]

## 2019-10-01 NOTE — Progress Notes (Signed)
@   2536 patient changed rhythms. HR increased to 111 sustained. EKG done showed right bundle branch block w/wide QRS. Notified Jovita Gamma NP, he wants to continue to monitor and ordered the dobutamine to be titrated to 15.

## 2019-10-01 NOTE — Progress Notes (Addendum)
[facility]  PROGRESS NOTE  [facility]    Date Time: 10/01/19 8:22 AM  Patient Name: Keith Gates, Keith Gates       Patient Active Problem List   Diagnosis    Non-STEMI (non-ST elevated myocardial infarction)    DKA (diabetic ketoacidoses)    Heart block    Cardiac arrest       Assessment:      Cardiac arrest with complete heart block in the setting of suspected sepsis   Temporary Pacing wire inserted yesterday by Dr.Lee   CAD status post CABG 01/2019    LVEF 20% by echo April 2019   COVID negative   Renal Failure    Recommendations:    Some Vpacing overnight. Now ST 100's.  EP will continue to assess for PM need depending on neurological recovery.   Supportive care   Remains intubated and on pressors      Medications:      Scheduled Meds: PRN Meds:    calcium chloride, 1 g, Intravenous, Once  famotidine, 20 mg, Intravenous, Q12H SCH  heparin (porcine), 5,000 Units, Subcutaneous, Q12H SCH  meropenem, 500 mg, Intravenous, Q8H  sodium bicarbonate, 100 mEq, Intravenous, Once  sodium chloride, 1,000 mL, Intravenous, Once        Continuous Infusions:   IV fluids with sodium bicarbonate 100 mL/hr (09/30/19 2314)    DOBUTamine (DOBUTREX) 2 mg/mL IVPB 250 mL 8 mcg/kg/min (10/01/19 4098)    fentaNYL 50 mcg/hr (10/01/19 0347)    insulin regular 3 Units/hr (10/01/19 0657)    midazolam 4 mg/hr (10/01/19 0716)    norepinephrine (LEVOPHED) infusion 20 mcg/min (10/01/19 0714)    fentaNYL (PF), 50 mcg, Q1H PRN  fentaNYL (PF), 50 mcg, Q1H PRN  midazolam, 2.5 mg, Q1H PRN              Subjective:   Denies chest pain, SOB or palpitations.      Physical Exam:     Vitals:    10/01/19 0751   BP:    Pulse: (!) 101   Resp: 18   Temp:    SpO2: 100%     Temp (24hrs), Avg:95.8 F (35.4 C), Min:93 F (33.9 C), Max:96.8 F (36 C)      Telemetry reviewed no changes.     Intake and Output Summary (Last 24 hours) at Date Time    Intake/Output Summary (Last 24 hours) at 10/01/2019 1191  Last data filed at 10/01/2019  0700  Gross per 24 hour   Intake 2213.36 ml   Output 615 ml   Net 1598.36 ml       General Appearance:  Intubated  Head:  normocephalic  Lungs:  Ventilator support  Chest Wall:  No tenderness or deformity  Heart:  SR, ST   Extremities:  No cyanosis, clubbing or edema  Neurologic:  Unresponsive on sedation    Labs:     Recent Labs   Lab 10/01/19  0253 09/30/19  1412   Troponin I 1.09* 0.03             Recent Labs   Lab 10/01/19  0208   Bilirubin, Total 0.6   Protein, Total 6.2   Albumin 3.1*   ALT 66*   AST (SGOT) 52*     Recent Labs   Lab 10/01/19  0253   Magnesium 1.9     Recent Labs   Lab 10/01/19  0412  09/30/19  1411   PT 15.0  More results in Results Review 14.3  PT INR 1.2*  More results in Results Review 1.1   PTT  --   --  29   More results in Results Review = values in this interval not displayed.     Recent Labs   Lab 10/01/19  0253 09/30/19  1809 09/30/19  1411   WBC 17.68* 16.70* 19.29*   Hgb 11.8* 11.5* 10.8*   Hematocrit 36.2* 35.7* 35.5*   Platelets 113* 112* 186     Recent Labs   Lab 10/01/19  0253 10/01/19  0208 10/01/19  0024   Sodium 139 139 136   Potassium 6.9* 6.5* 7.3*   Chloride 109 109 109   CO2 13* 15* 15*   BUN 77.5* 76.9* 76.4*   Creatinine 3.5* 3.4* 3.4*   EGFR 17.9 18.5 18.5   Glucose 349* 385* 392*   Calcium 8.9 8.4* 8.5           Invalid input(s): FREET4    .  Lab Results   Component Value Date    BNP 294 (H) 11/10/2018      Estimated Creatinine Clearance: 23.6 mL/min (A) (based on SCr of 3.5 mg/dL (H)).    Weight Monitoring 01/22/2018 08/13/2018 08/14/2018 11/10/2018 09/30/2019 09/30/2019 10/01/2019   Height 175.3 cm 172.7 cm 180.3 cm - 180.3 cm - -   Height Method - - Stated - Stated - -   Weight 79.379 kg 65 kg 63.4 kg 65 kg 80.6 kg 76.9 kg 76.4 kg   Weight Method Bed Scale Bed Scale Bed Scale Actual Bed Scale - Bed Scale   BMI (calculated) 25.9 kg/m2 21.8 kg/m2 19.5 kg/m2 - 24.8 kg/m2 - -         Imaging:   Radiological Procedure reviewed.              Signed by: Ernestina Penna,  NP    Patient seen and examined, my assessment and plans as noted above.    Signed by    Encarnacion Slates, MD      [facility]  NP Spectralink [phone] (8am-5pm)  MD Spectralink [phone] (8am-5pm)  After hours, non urgent consult line [phone]  After Hours, urgent consults or to reach the on-call MD [phone]

## 2019-10-01 NOTE — Progress Notes (Signed)
10/01/19 0200   Provider Notification   Reason for Communication Critical lab value   Provider Name Jovita Gamma NP   Provider Role Consulting physician   Method of Communication Call   Response See orders   Notification Time 0145    K+ 7.3. Ordered for a redraw BMP, mag, phos

## 2019-10-01 NOTE — Progress Notes (Addendum)
1108 in rounds with Dr. Nedra Hai.  Notified Dr. Nedra Hai that heparin withheld this morning.    1600  Notified Dr. Nedra Hai that patient is covid negative.  Dr. Nedra Hai met with family.  Discussed course of illness, treatment plan.  All questions answered.  Translator in use as wife primarily speaks [place].    1630 Isolation precautions discontinued per Dr. Nedra Hai.  Wife at bedside.    1800 notified Dr. Nedra Hai that kidney US would require that arctic sun pads be moved.  Per Dr. Nedra Hai, kidney US will be done tomorrow.

## 2019-10-01 NOTE — Progress Notes (Signed)
10/01/19 0420   Provider Notification   Reason for Communication Evaluate   Critical Lab Chemistry   Critical Lab Value Troponin 1.09   Provider Name Dr. Laural Benes   Provider Role eICU Physician   Method of Communication Call   Readback Results Yes   Response No new orders     Reviewed how pt is on both dobutamine and levophed drips as well.

## 2019-10-01 NOTE — Progress Notes (Signed)
Patient BG 115. Per Dr. Nedra Hai hold insulin infusion until BG gets to 139 then start gtt @ 1 unit/hr.

## 2019-10-02 ENCOUNTER — Inpatient Hospital Stay: Payer: PRIVATE HEALTH INSURANCE

## 2019-10-02 LAB — BASIC METABOLIC PANEL
Anion Gap: 9 (ref 5.0–15.0)
Anion Gap: 10 (ref 5.0–15.0)
BUN: 50.4 mg/dL — ABNORMAL HIGH (ref 9.0–28.0)
BUN: 43.6 mg/dL — ABNORMAL HIGH (ref 9.0–28.0)
CO2: 28 mEq/L (ref 22–29)
CO2: 25 mEq/L (ref 22–29)
Calcium: 8.1 mg/dL — ABNORMAL LOW (ref 8.5–10.5)
Calcium: 8 mg/dL — ABNORMAL LOW (ref 8.5–10.5)
Chloride: 104 mEq/L (ref 100–111)
Chloride: 105 mEq/L (ref 100–111)
Creatinine: 2.1 mg/dL — ABNORMAL HIGH (ref 0.7–1.3)
Creatinine: 2 mg/dL — ABNORMAL HIGH (ref 0.7–1.3)
Glucose: 138 mg/dL — ABNORMAL HIGH (ref 70–100)
Glucose: 170 mg/dL — ABNORMAL HIGH (ref 70–100)
Potassium: 4.7 mEq/L (ref 3.5–5.1)
Potassium: 4.4 mEq/L (ref 3.5–5.1)
Sodium: 141 mEq/L (ref 136–145)
Sodium: 140 mEq/L (ref 136–145)

## 2019-10-02 LAB — GLUCOSE WHOLE BLOOD - POCT
Whole Blood Glucose POCT: 113 mg/dL — ABNORMAL HIGH (ref 70–100)
Whole Blood Glucose POCT: 119 mg/dL — ABNORMAL HIGH (ref 70–100)
Whole Blood Glucose POCT: 125 mg/dL — ABNORMAL HIGH (ref 70–100)
Whole Blood Glucose POCT: 125 mg/dL — ABNORMAL HIGH (ref 70–100)
Whole Blood Glucose POCT: 127 mg/dL — ABNORMAL HIGH (ref 70–100)
Whole Blood Glucose POCT: 127 mg/dL — ABNORMAL HIGH (ref 70–100)
Whole Blood Glucose POCT: 132 mg/dL — ABNORMAL HIGH (ref 70–100)
Whole Blood Glucose POCT: 136 mg/dL — ABNORMAL HIGH (ref 70–100)
Whole Blood Glucose POCT: 142 mg/dL — ABNORMAL HIGH (ref 70–100)
Whole Blood Glucose POCT: 143 mg/dL — ABNORMAL HIGH (ref 70–100)
Whole Blood Glucose POCT: 147 mg/dL — ABNORMAL HIGH (ref 70–100)
Whole Blood Glucose POCT: 147 mg/dL — ABNORMAL HIGH (ref 70–100)
Whole Blood Glucose POCT: 149 mg/dL — ABNORMAL HIGH (ref 70–100)
Whole Blood Glucose POCT: 154 mg/dL — ABNORMAL HIGH (ref 70–100)
Whole Blood Glucose POCT: 161 mg/dL — ABNORMAL HIGH (ref 70–100)
Whole Blood Glucose POCT: 166 mg/dL — ABNORMAL HIGH (ref 70–100)
Whole Blood Glucose POCT: 173 mg/dL — ABNORMAL HIGH (ref 70–100)

## 2019-10-02 LAB — ECG 12-LEAD
Atrial Rate: 80 {beats}/min
Atrial Rate: 80 {beats}/min
P Axis: 51 degrees
P Axis: 41 degrees
P-R Interval: 210 ms
P-R Interval: 212 ms
Q-T Interval: 406 ms
Q-T Interval: 378 ms
QRS Duration: 112 ms
QRS Duration: 108 ms
QTC Calculation (Bezet): 468 ms
QTC Calculation (Bezet): 435 ms
R Axis: -49 degrees
R Axis: -48 degrees
T Axis: 111 degrees
T Axis: 111 degrees
Ventricular Rate: 80 {beats}/min
Ventricular Rate: 80 {beats}/min

## 2019-10-02 LAB — CBC AND DIFFERENTIAL
Absolute NRBC: 0 10*3/uL (ref 0.00–0.00)
Basophils Absolute Automated: 0.05 10*3/uL (ref 0.00–0.08)
Basophils Automated: 0.4 %
Eosinophils Absolute Automated: 0.28 10*3/uL (ref 0.00–0.44)
Eosinophils Automated: 2.2 %
Hematocrit: 28.4 % — ABNORMAL LOW (ref 37.6–49.6)
Hgb: 9.6 g/dL — ABNORMAL LOW (ref 12.5–17.1)
Immature Granulocytes Absolute: 0.05 10*3/uL (ref 0.00–0.07)
Immature Granulocytes: 0.4 %
Lymphocytes Absolute Automated: 1.11 10*3/uL (ref 0.42–3.22)
Lymphocytes Automated: 8.6 %
MCH: 29.4 pg (ref 25.1–33.5)
MCHC: 33.8 g/dL (ref 31.5–35.8)
MCV: 86.9 fL (ref 78.0–96.0)
MPV: 12 fL (ref 8.9–12.5)
Monocytes Absolute Automated: 1.08 10*3/uL — ABNORMAL HIGH (ref 0.21–0.85)
Monocytes: 8.3 %
Neutrophils Absolute: 10.39 10*3/uL — ABNORMAL HIGH (ref 1.10–6.33)
Neutrophils: 80.1 %
Nucleated RBC: 0 /100 WBC (ref 0.0–0.0)
Platelets: 111 10*3/uL — ABNORMAL LOW (ref 142–346)
RBC: 3.27 10*6/uL — ABNORMAL LOW (ref 4.20–5.90)
RDW: 13 % (ref 11–15)
WBC: 12.96 10*3/uL — ABNORMAL HIGH (ref 3.10–9.50)

## 2019-10-02 LAB — PHOSPHORUS
Phosphorus: 2.7 mg/dL (ref 2.3–4.7)
Phosphorus: 2.9 mg/dL (ref 2.3–4.7)
Phosphorus: 2.4 mg/dL (ref 2.3–4.7)

## 2019-10-02 LAB — GFR
EGFR: 26.3
EGFR: 30.5
EGFR: 32.2
EGFR: 34.1

## 2019-10-02 LAB — MAGNESIUM
Magnesium: 1.6 mg/dL (ref 1.6–2.6)
Magnesium: 1.9 mg/dL (ref 1.6–2.6)

## 2019-10-02 LAB — CALCIUM, IONIZED
Calcium, Ionized: 2.36 mEq/L (ref 2.20–2.60)
Calcium, Ionized: 2.34 mEq/L (ref 2.20–2.60)
Calcium, Ionized: 2.37 mEq/L (ref 2.20–2.60)
Calcium, Ionized: 2.33 mEq/L (ref 2.20–2.60)

## 2019-10-02 LAB — HEMOGLOBIN A1C
Average Estimated Glucose: 137 mg/dL
Hemoglobin A1C: 6.4 % — ABNORMAL HIGH (ref 4.6–5.9)

## 2019-10-02 LAB — CBC
Absolute NRBC: 0 10*3/uL (ref 0.00–0.00)
Hematocrit: 28.7 % — ABNORMAL LOW (ref 37.6–49.6)
Hgb: 9.7 g/dL — ABNORMAL LOW (ref 12.5–17.1)
MCH: 29.3 pg (ref 25.1–33.5)
MCHC: 33.8 g/dL (ref 31.5–35.8)
MCV: 86.7 fL (ref 78.0–96.0)
MPV: 11.9 fL (ref 8.9–12.5)
Nucleated RBC: 0 /100 WBC (ref 0.0–0.0)
Platelets: 111 10*3/uL — ABNORMAL LOW (ref 142–346)
RBC: 3.31 10*6/uL — ABNORMAL LOW (ref 4.20–5.90)
RDW: 13 % (ref 11–15)
WBC: 13.18 10*3/uL — ABNORMAL HIGH (ref 3.10–9.50)

## 2019-10-02 LAB — COMPREHENSIVE METABOLIC PANEL
ALT: 82 U/L — ABNORMAL HIGH (ref 0–55)
ALT: 85 U/L — ABNORMAL HIGH (ref 0–55)
AST (SGOT): 77 U/L — ABNORMAL HIGH (ref 5–34)
AST (SGOT): 79 U/L — ABNORMAL HIGH (ref 5–34)
Albumin/Globulin Ratio: 1 (ref 0.9–2.2)
Albumin/Globulin Ratio: 1 (ref 0.9–2.2)
Albumin: 2.7 g/dL — ABNORMAL LOW (ref 3.5–5.0)
Alkaline Phosphatase: 87 U/L (ref 38–106)
Alkaline Phosphatase: 93 U/L (ref 38–106)
Anion Gap: 13 (ref 5.0–15.0)
BUN: 47.3 mg/dL — ABNORMAL HIGH (ref 9.0–28.0)
Bilirubin, Total: 0.6 mg/dL (ref 0.2–1.2)
Bilirubin, Total: 0.6 mg/dL (ref 0.2–1.2)
CO2: 26 mEq/L (ref 22–29)
Calcium: 8 mg/dL — ABNORMAL LOW (ref 8.5–10.5)
Chloride: 104 mEq/L (ref 100–111)
Creatinine: 2.2 mg/dL — ABNORMAL HIGH (ref 0.7–1.3)
Globulin: 2.7 g/dL (ref 2.0–3.6)
Globulin: 2.7 g/dL (ref 2.0–3.6)
Glucose: 143 mg/dL — ABNORMAL HIGH (ref 70–100)
Potassium: 4.4 mEq/L (ref 3.5–5.1)
Protein, Total: 5.4 g/dL — ABNORMAL LOW (ref 6.0–8.3)
Protein, Total: 5.4 g/dL — ABNORMAL LOW (ref 6.0–8.3)
Sodium: 143 mEq/L (ref 136–145)

## 2019-10-02 LAB — FERRITIN: Ferritin: 145.21 ng/mL (ref 21.80–274.70)

## 2019-10-02 LAB — RENAL FUNCTION PANEL
Albumin: 2.7 g/dL — ABNORMAL LOW (ref 3.5–5.0)
Anion Gap: 13 (ref 5.0–15.0)
BUN: 52.8 mg/dL — ABNORMAL HIGH (ref 9.0–28.0)
CO2: 25 mEq/L (ref 22–29)
Calcium: 8.1 mg/dL — ABNORMAL LOW (ref 8.5–10.5)
Chloride: 105 mEq/L (ref 100–111)
Creatinine: 2.5 mg/dL — ABNORMAL HIGH (ref 0.7–1.3)
Glucose: 172 mg/dL — ABNORMAL HIGH (ref 70–100)
Phosphorus: 2.8 mg/dL (ref 2.3–4.7)
Potassium: 4.5 mEq/L (ref 3.5–5.1)
Sodium: 143 mEq/L (ref 136–145)

## 2019-10-02 LAB — PT/INR
PT INR: 1.2 — ABNORMAL HIGH (ref 0.9–1.1)
PT INR: 1.1 (ref 0.9–1.1)
PT: 14.7 s (ref 12.6–15.0)
PT: 14.1 s (ref 12.6–15.0)

## 2019-10-02 LAB — FOLATE: Folate: 15.4 ng/mL

## 2019-10-02 LAB — B-TYPE NATRIURETIC PEPTIDE: B-Natriuretic Peptide: 499.8 pg/mL — ABNORMAL HIGH (ref 0.0–100.0)

## 2019-10-02 LAB — IRON PROFILE
Iron Saturation: 8 % — ABNORMAL LOW (ref 15–50)
Iron: 17 ug/dL — ABNORMAL LOW (ref 40–160)
TIBC: 217 ug/dL — ABNORMAL LOW (ref 261–462)
UIBC: 200 ug/dL (ref 126–382)

## 2019-10-02 LAB — VITAMIN B12: Vitamin B-12: 1188 pg/mL — ABNORMAL HIGH (ref 211–911)

## 2019-10-02 LAB — LYME AB, TOTAL,REFLEX TO WESTERN BLOT (IGG & IGM): Lyme AB,Total,Reflx to WB(IGM): 0.1

## 2019-10-02 LAB — HEMOLYSIS INDEX: Hemolysis Index: 7 (ref 0–18)

## 2019-10-02 MED ORDER — GLUCAGON 1 MG IJ SOLR (WRAP)
1.00 mg | INTRAMUSCULAR | Status: DC | PRN
Start: 2019-10-02 — End: 2019-10-07

## 2019-10-02 MED ORDER — INSULIN GLARGINE 100 UNIT/ML SC SOLN
10.00 [IU] | Freq: Every evening | SUBCUTANEOUS | Status: DC
Start: 2019-10-02 — End: 2019-10-06
  Administered 2019-10-02 – 2019-10-05 (×4): 10 [IU] via SUBCUTANEOUS
  Filled 2019-10-02 (×4): qty 10

## 2019-10-02 MED ORDER — PIPERACILLIN SOD-TAZOBACTAM SO 2.25 (2-0.25) G IV SOLR
2.25 g | Freq: Four times a day (QID) | INTRAVENOUS | Status: DC
Start: 2019-10-02 — End: 2019-10-07
  Administered 2019-10-02 – 2019-10-07 (×20): 2.25 g via INTRAVENOUS
  Filled 2019-10-02 (×20): qty 2.25

## 2019-10-02 MED ORDER — PROPOFOL INFUSION 10 MG/ML
0.00 ug/kg/min | INTRAVENOUS | Status: DC
Start: 2019-10-02 — End: 2019-10-05
  Administered 2019-10-02 – 2019-10-04 (×3): 10 ug/kg/min via INTRAVENOUS
  Filled 2019-10-02 (×3): qty 100

## 2019-10-02 MED ORDER — INSULIN LISPRO 100 UNIT/ML SC SOLN
1.00 [IU] | SUBCUTANEOUS | Status: DC
Start: 2019-10-02 — End: 2019-10-05
  Administered 2019-10-04: 2 [IU] via SUBCUTANEOUS
  Administered 2019-10-04: 09:00:00 3 [IU] via SUBCUTANEOUS
  Administered 2019-10-04: 17:00:00 1 [IU] via SUBCUTANEOUS
  Administered 2019-10-04 (×2): 2 [IU] via SUBCUTANEOUS
  Administered 2019-10-05 (×2): 3 [IU] via SUBCUTANEOUS
  Administered 2019-10-05: 05:00:00 2 [IU] via SUBCUTANEOUS
  Filled 2019-10-02 (×3): qty 6
  Filled 2019-10-02: qty 3
  Filled 2019-10-02: qty 6
  Filled 2019-10-02 (×3): qty 9

## 2019-10-02 MED ORDER — MAGNESIUM SULFATE IN D5W 1-5 GM/100ML-% IV SOLN
1.00 g | Freq: Once | INTRAVENOUS | Status: AC
Start: 2019-10-02 — End: 2019-10-02
  Administered 2019-10-02: 06:00:00 1 g via INTRAVENOUS
  Filled 2019-10-02: qty 100

## 2019-10-02 MED ORDER — LEVETIRACETAM 500 MG/5ML IV SOLN
500.00 mg | Freq: Two times a day (BID) | INTRAVENOUS | Status: DC
Start: 2019-10-02 — End: 2019-10-07
  Administered 2019-10-02 – 2019-10-07 (×10): 500 mg via INTRAVENOUS
  Filled 2019-10-02 (×11): qty 5

## 2019-10-02 MED ORDER — GLUCOSE 40 % PO GEL
15.00 g | ORAL | Status: DC | PRN
Start: 2019-10-02 — End: 2019-10-10

## 2019-10-02 MED ORDER — DEXTROSE 50 % IV SOLN
12.50 g | INTRAVENOUS | Status: DC | PRN
Start: 2019-10-02 — End: 2019-10-10
  Administered 2019-10-06 – 2019-10-07 (×2): 12.5 g via INTRAVENOUS
  Filled 2019-10-02 (×2): qty 50

## 2019-10-02 NOTE — Progress Notes (Signed)
[facility]- Critical Care Note     ICU Daily Progress Note        Date Time: 10/02/19 10:38 PM  Patient Name: Keith Gates  Attending Physician: Kirstie Mirza I, MD  Room: IC03/IC03-A   Admit Date: 09/30/2019  LOS: 2 days            Assessment:     Patient Active Problem List   Diagnosis    Non-STEMI (non-ST elevated myocardial infarction)    DKA (diabetic ketoacidoses)    Heart block    Cardiac arrest     Admitted with complete heart block, cardiorespiratory arrest, urinary tract infection and septic shock.  Temporary pacemaker placed on admission.  Completed therapeutic hypothermia treatment.  Patient has normal sinus rhythm.  Hemodynamically stable.  Decreasing norepinephrine infusion.  Intermittent tremors and myoclonic jerks of upper extremities, question ischemic brain injury or seizures.  Serratia urinary tract infection presently on Zosyn.  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  Ischemic cardiomyopathy.  Acute on chronic renal failure, creatinine improved 2.1     Plan:   Start Keppra.  Propofol for sedation as well as benzodiazepines.  After pacemaker is removed we will get CT scan of the head.  EEG.  Continue present to biotics for above infection.  Insulin sliding scale.  On medical therapy as outlined.  Guarded prognosis.      OTHER:  Glycemic Control. Glucose stabilizer per ICU protocol when on insulin drip. Maintain blood glucose 140-180.   Replace electrolytes per ICU electrolyte replacement protocol  Ventilator bundle & Sedation protocol followed. Daily morning sedation holiday, assessment for readiness for SBT & weaning from ventilator; and then re-titrate if required. Aim to keep peak plateau pressure 25-30cm H2O in ARDS patient. Cass tube to suction at 20-30 cm H2O, Maintain Cass tube with 5-84ml air every 4 hours. Chlorhexidine mouth washes and routine oral care every 4 hours. Stress ulcer and DVT prophylaxis. HOB >=30 degree elevation all the time.  HOB >=30 degree elevation all  the time. Aggressive pulmonary toileting. Incentive spirometry when appropriate. Aspiration precautions.     Quality Care: Stress ulcer prophylaxis, DVT prophylaxis, HOB elevated, Infection control all reviewed and addressed.  Events and notes from last 24 hours reviewed. Care plan discussed with nursing.       CC TIME: >50 min         Subjective:   Unresponsive, sedated, orally intubated    Medications:       Scheduled Meds: PRN Meds:    famotidine, 20 mg, Intravenous, Q24H SCH  heparin (porcine), 5,000 Units, Subcutaneous, Q12H SCH  insulin glargine, 10 Units, Subcutaneous, QHS  insulin lispro, 1-5 Units, Subcutaneous, Q4H SCH  levETIRAcetam, 500 mg, Intravenous, Q12H SCH  piperacillin-tazobactam, 2.25 g, Intravenous, Q6H  sodium chloride, 1,000 mL, Intravenous, Once        Continuous Infusions:   midazolam 8 mg/hr (10/02/19 2000)    norepinephrine (LEVOPHED) infusion 6 mcg/min (10/02/19 2215)    propofol 10 mcg/kg/min (10/02/19 2030)    dextrose, 15 g of glucose, PRN    And  dextrose, 12.5 g, PRN    And  glucagon (rDNA), 1 mg, PRN  fentaNYL (PF), 50 mcg, Q1H PRN  fentaNYL (PF), 50 mcg, Q1H PRN  midazolam, 2 mg, Q1H PRN              Physical Exam:     Vitals:    10/02/19 2130 10/02/19 2145 10/02/19 2200 10/02/19 2215   BP: 126/83 (!) 171/109 113/76 98/62  Pulse: 81 80 79 82   Resp: 12 14 (!) 7 (!) 8   Temp:       TempSrc:       SpO2: 100% 100% 100% 100%   Weight:       Height:         Temp (24hrs), Avg:99 F (37.2 C), Min:98.8 F (37.1 C), Max:99.3 F (37.4 C)           12/15 0701 - 12/16 0700  In: 3513.7 [I.V.:3513.7]  Out: 1935 [Urine:1935]       General Appearance: Orally intubated  Mental status: Opens eyes with stimuli.  Intermittently appeared to move eyes towards spoken stimuli.  Intermittent slight tremors of upper extremities.  No purposeful response.  Neuro: As above.  Slight withdrawal of upper extremities intermittently lower extremities.    H & N: No JVD  Lungs: Few rhonchi  bilaterally  Cardiac: Regular normal sounds  Abdomen: Soft depressible  Extremities: Trace edema  Skin: No rash      Data:       Vent Settings:    Vent Settings  Vent Mode: PRVC  FiO2: 40 %  Resp Rate (Set): 18  Vt (Set, mL): 450 mL  PIP Observed (cm H2O): 23 cm H2O  PEEP/EPAP: 5 cm H20  Mean Airway Pressure: 9 cmH20      Labs:     Recent CBC   Recent Labs   Lab 10/02/19  1641 10/02/19  0422 10/01/19  1509   WBC 12.96* 13.18* 15.37*   RBC 3.27* 3.31* 3.45*   Hgb 9.6* 9.7* 10.2*   Hematocrit 28.4* 28.7* 29.5*   MCV 86.9 86.7 85.5   Platelets 111* 111* 113*       Recent Labs   Lab 10/02/19  1641 10/02/19  1012 10/02/19  0422 10/01/19  2305  10/01/19  0903  10/01/19  0253  09/30/19  1412 09/30/19  1411   Sodium 141 143 143 142  More results in Results Review 137  --  139  More results in Results Review  --  141   Potassium 4.7 4.4 4.5 4.3  More results in Results Review 5.4*  --  6.9*  More results in Results Review  --  4.2   Chloride 104 104 105 106  More results in Results Review 106  --  109  More results in Results Review  --  114*   CO2 28 26 25 24   More results in Results Review 18*  --  13*  More results in Results Review  --  <6*   Glucose 138* 143* 172* 170*  More results in Results Review 311*  --  349*  More results in Results Review  --  225*   BUN 50.4* 47.3* 52.8* 58.5*  More results in Results Review 71.0*  --  77.5*  More results in Results Review  --  78.3*   Creatinine 2.1* 2.2* 2.5* 2.6*  More results in Results Review 3.1*  --  3.5*  More results in Results Review  --  4.1*   Magnesium  --  1.9 1.6 2.0  More results in Results Review 1.7  --  1.9  More results in Results Review  --  2.6   Phosphorus 2.9 2.7 2.8 2.7  More results in Results Review 1.3*  --  2.8  More results in Results Review  --   --    AST (SGOT)  --  79* 77* 54*  --   --   --   --  More results in Results Review  --  53*   ALT  --  85* 82* 62*  --   --   --   --   More results in Results Review  --  65*   Alkaline Phosphatase   --  93 87 83  --   --   --   --   More results in Results Review  --  97   Bilirubin, Total  --  0.6 0.6 0.6  --   --   --   --   More results in Results Review  --  0.4   B-Natriuretic Peptide  --   --  499.8*  --   --   --   --   --   --   --   --    PT  --  14.1 14.7 15.3*  More results in Results Review 15.0  More results in Results Review  --   More results in Results Review  --  14.3   PT INR  --  1.1 1.2* 1.2*  More results in Results Review 1.2*  More results in Results Review  --   More results in Results Review  --  1.1   PTT  --   --   --   --   --   --   --   --   --   --  29   Troponin I  --   --   --   --   --  0.93*  --  1.09*  --  0.03  --    More results in Results Review = values in this interval not displayed.           Rads:     Radiology Results (24 Hour)     Procedure Component Value Units Date/Time    US Renal Kidney Bladder Complete [[id number]] Collected: 10/02/19 2210    Order Status: Completed Updated: 10/02/19 2215    Narrative:      History: ARF.    COMPARISON: None.    FINDINGS:    Evaluation is limited due to patient body habitus. The right kidney  measures 11.8 cm in length. The left kidney measures 10.4 cm in length.  The bladder is nondistended. There is no hydronephrosis.      Impression:       No hydronephrosis.    Rocky Crafts, MD   10/02/2019 10:13 PM          Radiological Imaging personally reviewed.    I have personally reviewed the patients history and 24 hour interval events, along with vitals, labs, radiology images and  ventilator settings and additional findings found in detail within ICU team notes, with their care plans developed with and reviewed by me.     Time spent in patient evaluation and treatment in critical care excluding procedures, and not overlapping any other providers: 60    minutes.    Signed by: Durward Fortes, MD  Date/Time: 10/02/19 10:38 PM

## 2019-10-02 NOTE — Progress Notes (Signed)
Case Manager left message for patient's daughter, Aileen Pilot, at [phone] to complete the initial assessment. Patient is still on vent and Patient's wife does not speak [place].  CM is waiting for a call back and will follow up as needed.    Jonetta Osgood, RN MSN ACM  Clinical Case Manager II  [phone]

## 2019-10-02 NOTE — Progress Notes (Signed)
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  EP will continue to assess for PM need depending on neurological recovery.   Supportive care, continue rewarming   Remains intubated and on pressors   Pending neuro recovery, will defer on further echo, cath, EP testing      Medications:      Scheduled Meds: PRN Meds:    famotidine, 20 mg, Intravenous, Q24H SCH  heparin (porcine), 5,000 Units, Subcutaneous, Q12H SCH  meropenem, 500 mg, Intravenous, Q12H  sodium chloride, 1,000 mL, Intravenous, Once        Continuous Infusions:   DOBUTamine (DOBUTREX) 2 mg/mL IVPB 250 mL Stopped (10/01/19 2252)    fentaNYL 100 mcg/hr (10/02/19 0443)    insulin regular Stopped (10/02/19 0845)    midazolam 6 mg/hr (10/02/19 0356)    norepinephrine (LEVOPHED) infusion 15 mcg/min (10/02/19 0443)    dextrose, 12.5 g, PRN  fentaNYL (PF), 50 mcg, Q1H PRN  fentaNYL (PF), 50 mcg, Q1H PRN  midazolam, 2 mg, Q1H PRN              Subjective:   Intubated, minimal sedation.  Rewarming.  Dobutamine off, levophed titrating down    Physical Exam:     Vitals:    10/02/19 0800   BP: 112/65   Pulse: 78   Resp: (!) 0   Temp:    SpO2: 100%     No data recorded.      Telemetry reviewed no changes.     Intake and Output Summary (Last 24 hours) at Date Time    Intake/Output Summary (Last 24 hours) at 10/02/2019 0911  Last data filed at 10/02/2019 0900  Gross per 24  hour   Intake 3513.7 ml   Output 2210 ml   Net 1303.7 ml       General Appearance:  Intubated  Head:  normocephalic  Lungs:  Ventilator support  Chest Wall:  No tenderness or deformity  Heart:  SR, ST   Extremities:  No cyanosis, clubbing or edema  Neurologic:  Unresponsive on sedation    Labs:     Recent Labs   Lab 10/01/19  0903 10/01/19  0253 09/30/19  1412   Troponin I 0.93* 1.09* 0.03             Recent Labs   Lab 10/02/19  0422   Bilirubin, Total 0.6   Protein, Total 5.4*   Albumin 2.7*   ALT 82*   AST (SGOT) 77*     Recent Labs   Lab 10/02/19  0422   Magnesium 1.6     Recent Labs   Lab 10/02/19  0422  09/30/19  1411   PT 14.7  More results in Results Review 14.3  PT INR 1.2*  More results in Results Review 1.1   PTT  --   --  29   More results in Results Review = values in this interval not displayed.     Recent Labs   Lab 10/02/19  0422 10/01/19  1509 10/01/19  0253   WBC 13.18* 15.37* 17.68*   Hgb 9.7* 10.2* 11.8*   Hematocrit 28.7* 29.5* 36.2*   Platelets 111* 113* 113*     Recent Labs   Lab 10/02/19  0422 10/01/19  2305 10/01/19  1509   Sodium 143 142 141   Potassium 4.5 4.3 4.1   Chloride 105 106 108   CO2 25 24 22    BUN 52.8* 58.5* 66.7*   Creatinine 2.5* 2.6* 2.8*   EGFR 26.3 25.2 23.1   Glucose 172* 170* 180*   Calcium 8.1* 8.1* 8.3*           Invalid input(s): FREET4    .  Lab Results   Component Value Date    BNP 499.8 (H) 10/02/2019      Estimated Creatinine Clearance: 33 mL/min (A) (based on SCr of 2.5 mg/dL (H)).    Weight Monitoring 08/13/2018 08/14/2018 11/10/2018 09/30/2019 09/30/2019 10/01/2019 10/02/2019   Height 172.7 cm 180.3 cm - 180.3 cm - - -   Height Method - Stated - Stated - - -   Weight 65 kg 63.4 kg 65 kg 80.6 kg 76.9 kg 76.4 kg 80.3 kg   Weight Method Bed Scale Bed Scale Actual Bed Scale - Bed Scale Bed Scale   BMI (calculated) 21.8 kg/m2 19.5 kg/m2 - 24.8 kg/m2 - - -         Imaging:   Radiological Procedure reviewed.                Signed by    Keitha Butte, MD      [facility]  NP Spectralink [phone] (8am-5pm)  MD Spectralink [phone] (8am-5pm)  After hours, non urgent consult line [phone]  After Hours, urgent consults or to reach the on-call MD [phone]

## 2019-10-02 NOTE — Progress Notes (Signed)
0900-  Dr. Amado Nash rounding for cardiology service.  Notified Dr. Amado Nash that patient has been off of Dobutamine infusion since yesterday evening.  Now on Levophed gtt.   Transvenous pacemaker still connected but patient has not required pacing since yesterday.  Dr. Amado Nash changed pacer rate to 60 and will re-evaluate tomorrow  when patient has completed TTM.     1200-  Patient has reached re-warming goal of 37, will keep Artctic sun pads in place for 24 hours to maintain normothermia.     1250- Fentanyl stopped per Dr. Lesle Reek.  At this time patient will open eyes to pain or loud voice.  Not able to follow commands, mild termors noted by RN and Dr. Lesle Reek.  Withdraws from pain and grimaces to painful stimuli.     1522- patient observed shaking/tremoring to stimulation resembling seizure activity.  Dr. Lesle Reek notified and 2 mg Versed IV push given per prn order.

## 2019-10-02 NOTE — Progress Notes (Signed)
Vital AF 1.2 Tube Feeding started.   Q4 Accu Checks ordered.  Fentanyl stopped. Pt still on Levo and Versed.   VSS.

## 2019-10-02 NOTE — Plan of Care (Signed)
Electrolyte replacement ordered.

## 2019-10-02 NOTE — Plan of Care (Signed)
Discussed with Dr. Sedonia Small, would not remove pacer wire until patient has been warmed as hypothermia can induce bradycardia. Will re-assess temp pacer wire's need as he fully warmed.    Signed by     Marcelino Scot, PA-C  Cardiac Electrophysiology  [facility]  [facility]    [facility] Cardiology APP Spectralink [phone] (8am-5pm M-F)  [facility] Arrhythmia APP [facility] [phone] (8am-4:30pm M-F)  After hours, non urgent consult line [phone]  After Hours, urgent consults [phone]  You can also reach [place] 24/7 through our main EP office [phone].

## 2019-10-02 NOTE — Progress Notes (Signed)
Infectious Disease            Progress Note    10/02/2019   Keith Gates ZOX:[id number] is a 63 y.o. male, history of diabetes mellitus, hypertension, hyperlipidemia, stroke, coronary artery disease status post coronary artery bypass grafting who was admitted after cardiac arrest, CPR, with sepsis, UTI, possible pneumonia, aspiration.    Subjective:     Keith Gates today Symptoms: Afebrile, improving leukocytosis, having some jerky movements, sputum cultures growing Serratia. Other review of system is non contributory.    Objective:     Blood pressure 122/76, pulse 82, temperature (!) 96.8 F (36 C), temperature source Core, resp. rate (!) 5, height 1.803 m (5\' 11" ), weight 80.3 kg (177 lb 0.5 oz), SpO2 100 %.    General Appearance: Intubated  HEENT: Pallor positive, Anicteric sclera.   Neck: Supple  Lungs:Decreased breath sound at bases.   Chest Wall: Symmetric chest wall expansion.   Heart : S1 and S2.   Abdomen: Abdomen is soft, bowel sounds positive.  Neurological: Intubated and sedated    Laboratory And Diagnostic Studies:     Recent Labs     10/02/19  0422 10/01/19  1509 10/01/19  0253  09/30/19  1411   WBC 13.18* 15.37* 17.68*   < > 19.29*   Hgb 9.7* 10.2* 11.8*   < > 10.8*   Hematocrit 28.7* 29.5* 36.2*   < > 35.5*   Platelets 111* 113* 113*   < > 186   Neutrophils  --   --  87.1  --   --    Segmented Neutrophils  --   --   --   --  29    < > = values in this interval not displayed.     Recent Labs     10/02/19  0422 10/01/19  2305   Sodium 143 142   Potassium 4.5 4.3   Chloride 105 106   CO2 25 24   BUN 52.8* 58.5*   Creatinine 2.5* 2.6*   Glucose 172* 170*   Calcium 8.1* 8.1*     Recent Labs     10/02/19  0422 10/01/19  2305   AST (SGOT) 77* 54*   ALT 82* 62*   Alkaline Phosphatase 87 83   Protein, Total 5.4* 5.3*   Albumin 2.7* 2.7*   Bilirubin, Total 0.6 0.6       Current Med's:     Current Facility-Administered Medications   Medication Dose Route Frequency    famotidine  20  mg Intravenous Q24H SCH    heparin (porcine)  5,000 Units Subcutaneous Q12H SCH    meropenem  500 mg Intravenous Q12H    sodium chloride  1,000 mL Intravenous Once       Lines/Drains:     Patient Lines/Drains/Airways Status    Active Lines, Drains and Airways     Name:   Placement date:   Placement time:   Site:   Days:    CVC Triple Lumen 09/30/19 Left Internal jugular   09/30/19    2240    Internal jugular   1    Peripheral IV 09/30/19 18 G Anterior;Distal;Left Upper Arm   09/30/19    1342    Upper Arm   1    Peripheral IV 09/30/19 20 G Distal;Posterior;Right Forearm   09/30/19    1444    Forearm   1    Urethral Catheter Double-lumen 16 Fr.   09/30/19    1413  Double-lumen   1    ETT  7 mm   09/30/19    1402     1    Pacer Wires   09/30/19    1544    Ventricular   1    Venous Sheath 6 Fr. Right Femoral   09/30/19    1543    Femoral   1    NG/OG Tube Orogastric 18 Fr. 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 Trina Ao, M.D.,FACP  10/02/2019  9:39 AM          *This note was generated by the Epic EMR system/ Dragon speech recognition and may contain inherent errors or omissions not intended by the user. Grammatical errors, random word insertions, deletions, pronoun errors and incomplete sentences are occasional  consequences of this technology due to software limitations. Not all errors are caught or corrected. If there are questions or concerns about the content of this note or information contained within the body of this dictation they should be addressed directly with the author for clarification

## 2019-10-03 ENCOUNTER — Inpatient Hospital Stay: Payer: PRIVATE HEALTH INSURANCE

## 2019-10-03 DIAGNOSIS — D509 Iron deficiency anemia, unspecified: Secondary | ICD-10-CM | POA: Insufficient documentation

## 2019-10-03 LAB — PHOSPHORUS
Phosphorus: 2.4 mg/dL (ref 2.3–4.7)
Phosphorus: 2.2 mg/dL — ABNORMAL LOW (ref 2.3–4.7)

## 2019-10-03 LAB — CBC AND DIFFERENTIAL
Absolute NRBC: 0 10*3/uL (ref 0.00–0.00)
Absolute NRBC: 0 10*3/uL (ref 0.00–0.00)
Basophils Absolute Automated: 0.04 10*3/uL (ref 0.00–0.08)
Basophils Absolute Automated: 0.04 10*3/uL (ref 0.00–0.08)
Basophils Automated: 0.3 %
Basophils Automated: 0.4 %
Eosinophils Absolute Automated: 0.28 10*3/uL (ref 0.00–0.44)
Eosinophils Absolute Automated: 0.27 10*3/uL (ref 0.00–0.44)
Eosinophils Automated: 2.3 %
Eosinophils Automated: 2.7 %
Hematocrit: 26.7 % — ABNORMAL LOW (ref 37.6–49.6)
Hematocrit: 25.5 % — ABNORMAL LOW (ref 37.6–49.6)
Hgb: 9 g/dL — ABNORMAL LOW (ref 12.5–17.1)
Hgb: 8.5 g/dL — ABNORMAL LOW (ref 12.5–17.1)
Immature Granulocytes Absolute: 0.06 10*3/uL (ref 0.00–0.07)
Immature Granulocytes Absolute: 0.03 10*3/uL (ref 0.00–0.07)
Immature Granulocytes: 0.5 %
Immature Granulocytes: 0.3 %
Lymphocytes Absolute Automated: 1.79 10*3/uL (ref 0.42–3.22)
Lymphocytes Absolute Automated: 1.36 10*3/uL (ref 0.42–3.22)
Lymphocytes Automated: 14.5 %
Lymphocytes Automated: 13.4 %
MCH: 29.2 pg (ref 25.1–33.5)
MCH: 28.8 pg (ref 25.1–33.5)
MCHC: 33.7 g/dL (ref 31.5–35.8)
MCHC: 33.3 g/dL (ref 31.5–35.8)
MCV: 86.7 fL (ref 78.0–96.0)
MCV: 86.4 fL (ref 78.0–96.0)
MPV: 12 fL (ref 8.9–12.5)
MPV: 12.1 fL (ref 8.9–12.5)
Monocytes Absolute Automated: 0.93 10*3/uL — ABNORMAL HIGH (ref 0.21–0.85)
Monocytes Absolute Automated: 0.73 10*3/uL (ref 0.21–0.85)
Monocytes: 7.5 %
Monocytes: 7.2 %
Neutrophils Absolute: 9.24 10*3/uL — ABNORMAL HIGH (ref 1.10–6.33)
Neutrophils Absolute: 7.75 10*3/uL — ABNORMAL HIGH (ref 1.10–6.33)
Neutrophils: 74.9 %
Neutrophils: 76 %
Nucleated RBC: 0 /100 WBC (ref 0.0–0.0)
Nucleated RBC: 0 /100 WBC (ref 0.0–0.0)
Platelets: 103 10*3/uL — ABNORMAL LOW (ref 142–346)
Platelets: 93 10*3/uL — ABNORMAL LOW (ref 142–346)
RBC: 3.08 10*6/uL — ABNORMAL LOW (ref 4.20–5.90)
RBC: 2.95 10*6/uL — ABNORMAL LOW (ref 4.20–5.90)
RDW: 14 % (ref 11–15)
RDW: 13 % (ref 11–15)
WBC: 12.34 10*3/uL — ABNORMAL HIGH (ref 3.10–9.50)
WBC: 10.18 10*3/uL — ABNORMAL HIGH (ref 3.10–9.50)

## 2019-10-03 LAB — CELL MORPHOLOGY
Cell Morphology: ABNORMAL — AB
Platelet Estimate: DECREASED — AB

## 2019-10-03 LAB — BASIC METABOLIC PANEL
Anion Gap: 11 (ref 5.0–15.0)
BUN: 42 mg/dL — ABNORMAL HIGH (ref 9.0–28.0)
CO2: 24 mEq/L (ref 22–29)
Calcium: 7.5 mg/dL — ABNORMAL LOW (ref 8.5–10.5)
Chloride: 106 mEq/L (ref 100–111)
Creatinine: 1.8 mg/dL — ABNORMAL HIGH (ref 0.7–1.3)
Glucose: 157 mg/dL — ABNORMAL HIGH (ref 70–100)
Potassium: 4.3 mEq/L (ref 3.5–5.1)
Sodium: 141 mEq/L (ref 136–145)

## 2019-10-03 LAB — MAGNESIUM: Magnesium: 1.8 mg/dL (ref 1.6–2.6)

## 2019-10-03 LAB — COMPREHENSIVE METABOLIC PANEL
ALT: 66 U/L — ABNORMAL HIGH (ref 0–55)
AST (SGOT): 63 U/L — ABNORMAL HIGH (ref 5–34)
Albumin/Globulin Ratio: 1 (ref 0.9–2.2)
Albumin: 2.5 g/dL — ABNORMAL LOW (ref 3.5–5.0)
Alkaline Phosphatase: 97 U/L (ref 38–106)
Anion Gap: 9 (ref 5.0–15.0)
BUN: 43 mg/dL — ABNORMAL HIGH (ref 9.0–28.0)
Bilirubin, Total: 0.5 mg/dL (ref 0.2–1.2)
CO2: 25 mEq/L (ref 22–29)
Calcium: 7.8 mg/dL — ABNORMAL LOW (ref 8.5–10.5)
Chloride: 105 mEq/L (ref 100–111)
Creatinine: 1.9 mg/dL — ABNORMAL HIGH (ref 0.7–1.3)
Globulin: 2.6 g/dL (ref 2.0–3.6)
Glucose: 164 mg/dL — ABNORMAL HIGH (ref 70–100)
Potassium: 4.4 mEq/L (ref 3.5–5.1)
Protein, Total: 5.1 g/dL — ABNORMAL LOW (ref 6.0–8.3)
Sodium: 139 mEq/L (ref 136–145)

## 2019-10-03 LAB — GLUCOSE WHOLE BLOOD - POCT
Whole Blood Glucose POCT: 156 mg/dL — ABNORMAL HIGH (ref 70–100)
Whole Blood Glucose POCT: 154 mg/dL — ABNORMAL HIGH (ref 70–100)
Whole Blood Glucose POCT: 146 mg/dL — ABNORMAL HIGH (ref 70–100)
Whole Blood Glucose POCT: 146 mg/dL — ABNORMAL HIGH (ref 70–100)
Whole Blood Glucose POCT: 162 mg/dL — ABNORMAL HIGH (ref 70–100)

## 2019-10-03 LAB — GFR
EGFR: 36.1
EGFR: 38.5

## 2019-10-03 LAB — CALCIUM, IONIZED
Calcium, Ionized: 2.3 mEq/L (ref 2.20–2.60)
Calcium, Ionized: 2.33 mEq/L (ref 2.20–2.60)

## 2019-10-03 MED ORDER — IRON SUCROSE 20 MG/ML IV SOLN
300.00 mg | INTRAVENOUS | Status: AC
Start: 2019-10-03 — End: 2019-10-05
  Administered 2019-10-03 – 2019-10-05 (×3): 300 mg via INTRAVENOUS
  Filled 2019-10-03 (×3): qty 15

## 2019-10-03 NOTE — UM Notes (Signed)
inp ref# [id number]      [facility] Utilization Review  NPI # [id number]  Tax ID [id number]  Please call Destyni Hoppel at [phone], [phone], or e-mail at [email or web address] with any questions or concerns.   Fax final authorization and request for additional information to [phone].    CSR 10/02/19 in ICU    VS: temp 98.6, HR 81, RR 7, BP 82/68    Abn labs  WBC 13.18  H/H 9.7 & 28.7  Platelets 111  Glucose 172  BUN 52.8  Creat 2.5  Calcimu 8.1  AST 77  ALT 82  BNP 499.8    Current medications  Scheduled Meds:  Current Facility-Administered Medications   Medication Dose Route Frequency    famotidine  20 mg Intravenous Q24H SCH    heparin (porcine)  5,000 Units Subcutaneous Q12H SCH    insulin glargine  10 Units Subcutaneous QHS    insulin lispro  1-5 Units Subcutaneous Q4H SCH    levETIRAcetam  500 mg Intravenous Q12H SCH    piperacillin-tazobactam  2.25 g Intravenous Q6H    sodium chloride  1,000 mL Intravenous Once     Continuous Infusions:   midazolam 10 mg/hr (10/03/19 0609)    norepinephrine (LEVOPHED) infusion 5 mcg/min (10/03/19 0519)    propofol 10 mcg/kg/min (10/02/19 2030)     PRN Meds:.Nursing communication: Adult Hypoglycemia Treatment Algorithm **AND** dextrose **AND** dextrose **AND** glucagon (rDNA), fentaNYL (PF), fentaNYL (PF), midazolam      ICU progress note  Assessment:         Patient Active Problem List   Diagnosis    Non-STEMI (non-ST elevated myocardial infarction)    DKA (diabetic ketoacidoses)    Heart block    Cardiac arrest     Admitted with complete heart block, cardiorespiratory arrest, urinary tract infection and septic shock.  Temporary pacemaker placed on admission.  Completed therapeutic hypothermia treatment.  Patient has normal sinus rhythm.  Hemodynamically stable.  Decreasing norepinephrine infusion.  Intermittent tremors and myoclonic jerks of upper extremities, question ischemic brain injury or seizures.  Serratia urinary tract  infection presently on Zosyn.  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  Ischemic cardiomyopathy.  Acute on chronic renal failure, creatinine improved 2.1     Plan:   Start Keppra.  Propofol for sedation as well as benzodiazepines.  After pacemaker is removed we will get CT scan of the head.  EEG.  Continue present to biotics for above infection.  Insulin sliding scale.  On medical therapy as outlined.  Guarded prognosis.

## 2019-10-03 NOTE — Progress Notes (Signed)
[facility]  NEPHROLOGY PROGRESS NOTE    Date Time: 10/03/19 8:41 AM  Patient Name: Keith Gates, Keith Gates  MRN#: [id number]  DOB: Jul 25, 1956  Requesting Physician: Durward Fortes, MD      Reason for Consultation:   ARF on CKD II    Assessment:   ARF  CKD II  Hyperkalemia  Acidosis  HypoPO4  Anemia  HTN    Plan:   - Renal: pt with ARF on CKD II. His CKD with proteinuria is due to his underlying DM and HTN. His ARF is due to ATN by his cardiac arrest causing hemodynamic instability. His renal function, sCr and UOP are improving with the current management. I will follow his renal function closely with you. Please avoid nephrotoxins like ACEI/ARB, NSAIDs and contrast as much as possible.    - Hyperkalemia: severe, due to ARF but improving with conservative management. I will follow his levels closely     - Acidosis: due to ARF and lactic acidosis. Resolved. Follow levels.    - HypoPO4: he was hyperPO4 on admission which is now resolved. I will follow his levels    - Anemia: I will give him iv venofer for severe iron deficiency.     - HTN: BP is low requiring pressors and inotropes. Will continue the current management and try to maintain MAP>65.       History:   Keith Gates is a 63 y.o. male who presents to the hospital on 09/30/2019 after cardiac arrest requiring 6 minutes of resuscitation on site by EMS. The pt has a h/o controlled DM, controlled HTN and CKD stage II with a baseline sCr in the low 1's and proteinuria. He has also a h/o CAD s/p CABG earlier 2020.   He was apparently c/o generalized weakness and SOB for 1-2 days and then was found unresponsive and bradycardic by his wife on the bed. EMS was called who found him to be bradycardic and he went into asystole. CPR was done for 6 minutes. He regained pulse and was intubated and admitted to the ICU. On admission his sCr was 4.1 and his K peaked at 7.3. His glucose was 392. He was treated medically with with pressors for low BP and  dobutamine drip and also iv bicarb for his acidosis and his sCr is now better at 3.1 and his K was 5.4. His CXR shows clear lungs.     When I saw the pt he was intubated and sedated on pressors unable to give any history or ROS.       Past Medical History:     Past Medical History:   Diagnosis Date    Diabetes mellitus     Elevated cholesterol     Hypertension     Stroke        Past Surgical History:     Past Surgical History:   Procedure Laterality Date    CORONARY ARTERY BYPASS GRAFT  01/2018    TEMPORARY PACER INSERTION N/A 09/30/2019    Procedure: TEMPORARY PACER INSERTION;  Surgeon: Acie Fredrickson, MD;  Location: [facility];  Service: Cardiovascular;  Laterality: N/A;       Family History:   History reviewed. No pertinent family history.    Social History:     Social History     Socioeconomic History    Marital status: Married     Spouse name: Not on file    Number of children: Not on file    Years  of education: Not on file    Highest education level: Not on file   Occupational History    Not on file   Social Needs    Financial resource strain: Not on file    Food insecurity     Worry: Not on file     Inability: Not on file    Transportation needs     Medical: Not on file     Non-medical: Not on file   Tobacco Use    Smoking status: Former Smoker     Years: 30.00     Quit date: 10/17/2002     Years since quitting: 16.9    Smokeless tobacco: Never Used   Substance and Sexual Activity    Alcohol use: Yes    Drug use: No    Sexual activity: Not on file   Lifestyle    Physical activity     Days per week: Not on file     Minutes per session: Not on file    Stress: Not on file   Relationships    Social connections     Talks on phone: Not on file     Gets together: Not on file     Attends religious service: Not on file     Active member of club or organization: Not on file     Attends meetings of clubs or organizations: Not on file     Relationship status: Not on file    Intimate partner  violence     Fear of current or ex partner: Not on file     Emotionally abused: Not on file     Physically abused: Not on file     Forced sexual activity: Not on file   Other Topics Concern    Not on file   Social History Narrative    Not on file       Allergies:   No Known Allergies    Medications:     Current Facility-Administered Medications   Medication Dose Route Frequency    famotidine  20 mg Intravenous Q24H SCH    heparin (porcine)  5,000 Units Subcutaneous Q12H [facility]    insulin glargine  10 Units Subcutaneous QHS    insulin lispro  1-5 Units Subcutaneous Q4H SCH    levETIRAcetam  500 mg Intravenous Q12H SCH    piperacillin-tazobactam  2.25 g Intravenous Q6H    sodium chloride  1,000 mL Intravenous Once       Review of Systems:   As per HPI, all other systems were reviewed and were negative.    Physical Exam:     Vitals:    10/03/19 0700   BP: 106/63   Pulse: 75   Resp: (!) 6   Temp:    SpO2: 100%       Intake and Output Summary (Last 24 hours) at Date Time    Intake/Output Summary (Last 24 hours) at 10/03/2019 0841  Last data filed at 10/03/2019 0600  Gross per 24 hour   Intake 1342.3 ml   Output 1280 ml   Net 62.3 ml       General appearance - alert, well appearing, and in no distress  Mental status - alert, oriented to person, place, and time  Eyes - pupils equal and reactive, extraocular eye movements intact  Mouth - mucous membranes moist, pharynx normal without lesions  Neck - supple, no significant adenopathy  Lymphatics - no palpable lymphadenopathy, no hepatosplenomegaly  Chest - clear  to auscultation, no wheezes, rales or rhonchi, symmetric air entry  Heart - normal rate, regular rhythm, normal S1, S2, no murmurs, rubs, clicks or gallops  Abdomen - soft, nontender, nondistended, no masses or organomegaly  Neurological - alert, oriented, normal speech, no focal findings or movement disorder noted  Musculoskeletal - no joint tenderness, deformity or swelling  Extremities - peripheral pulses  normal, no pedal edema, no clubbing or cyanosis  Skin - normal coloration and turgor, no rashes, no suspicious skin lesions noted    Labs Reviewed:     Results     Procedure Component Value Units Date/Time    Glucose Whole Blood - POCT [[id number]]  (Abnormal) Collected: 10/03/19 0723     Updated: 10/03/19 0817     Whole Blood Glucose POCT 154 mg/dL     Phosphorus [[id number]] Collected: 10/03/19 0310    Specimen: Blood Updated: 10/03/19 0400     Phosphorus 2.4 mg/dL     Comprehensive metabolic panel [[id number]]  (Abnormal) Collected: 10/03/19 0310    Specimen: Blood Updated: 10/03/19 0400     Glucose 164 mg/dL      BUN 08.6 mg/dL      Creatinine 1.9 mg/dL      Sodium 578 mEq/L      Potassium 4.4 mEq/L      Chloride 105 mEq/L      CO2 25 mEq/L      Calcium 7.8 mg/dL      Protein, Total 5.1 g/dL      Albumin 2.5 g/dL      AST (SGOT) 63 U/L      ALT 66 U/L      Alkaline Phosphatase 97 U/L      Bilirubin, Total 0.5 mg/dL      Globulin 2.6 g/dL      Albumin/Globulin Ratio 1.0     Anion Gap 9.0    Magnesium [[id number]] Collected: 10/03/19 0310    Specimen: Blood Updated: 10/03/19 0400     Magnesium 1.8 mg/dL     GFR [[id number]] Collected: 10/03/19 0310     Updated: 10/03/19 0400     EGFR 36.1    Glucose Whole Blood - POCT [[id number]]  (Abnormal) Collected: 10/03/19 0318     Updated: 10/03/19 0344     Whole Blood Glucose POCT 156 mg/dL     CBC and differential [[id number]]  (Abnormal) Collected: 10/03/19 0310     Updated: 10/03/19 0333     WBC 12.34 x10 3/uL      Hgb 9.0 g/dL      Hematocrit 63.8 %      Platelets 103 x10 3/uL      RBC 3.08 x10 6/uL      MCV 86.7 fL      MCH 29.2 pg      MCHC 33.7 g/dL      RDW 14 %      MPV 12.0 fL      Neutrophils 74.9 %      Lymphocytes Automated 14.5 %      Monocytes 7.5 %      Eosinophils Automated 2.3 %      Basophils Automated 0.3 %      Immature Granulocytes 0.5 %      Nucleated RBC 0.0 /100 WBC      Neutrophils Absolute 9.24 x10 3/uL      Lymphocytes Absolute Automated 1.79 x10 3/uL       Monocytes Absolute Automated 0.93 x10 3/uL      Eosinophils Absolute  Automated 0.28 x10 3/uL      Basophils Absolute Automated 0.04 x10 3/uL      Immature Granulocytes Absolute 0.06 x10 3/uL      Absolute NRBC 0.00 x10 3/uL     Calcium, ionized [[id number]] Collected: 10/03/19 0310    Specimen: Blood Updated: 10/03/19 0331     Calcium, Ionized 2.30 mEq/L     Basic Metabolic Panel [[id number]]  (Abnormal) Collected: 10/02/19 2319    Specimen: Blood Updated: 10/02/19 2340     Glucose 170 mg/dL      BUN 08.6 mg/dL      Creatinine 2.0 mg/dL      Calcium 8.0 mg/dL      Sodium 578 mEq/L      Potassium 4.4 mEq/L      Chloride 105 mEq/L      CO2 25 mEq/L      Anion Gap 10.0    Phosphorus [[id number]] Collected: 10/02/19 2319    Specimen: Blood Updated: 10/02/19 2340     Phosphorus 2.4 mg/dL     GFR [[id number]] Collected: 10/02/19 2319     Updated: 10/02/19 2340     EGFR 34.1    Calcium, ionized [[id number]] Collected: 10/02/19 2319    Specimen: Blood Updated: 10/02/19 2328     Calcium, Ionized 2.33 mEq/L     Glucose Whole Blood - POCT [[id number]]  (Abnormal) Collected: 10/02/19 2320     Updated: 10/02/19 2324     Whole Blood Glucose POCT 166 mg/dL     Glucose Whole Blood - POCT [[id number]]  (Abnormal) Collected: 10/02/19 1921     Updated: 10/02/19 1936     Whole Blood Glucose POCT 136 mg/dL     CULTURE + Dierdre Forth [[id number]] Collected: 09/30/19 1752    Specimen: Sputum, Suctioned Updated: 10/02/19 1729    Narrative:      ORDER#: [id number]                                    ORDERED BY: MENDIGUREN, IGN  SOURCE: Sputum, Suctioned ETT                        COLLECTED:  09/30/19 17:52  ANTIBIOTICS AT COLL.:                                RECEIVED :  09/30/19 21:24  Stain, Gram (Respiratory)                  FINAL       09/30/19 22:37  09/30/19   Moderate WBC's             Moderate Mixed Respiratory Flora             Few Squamous epithelial cells  Culture and Gram Stain, Aerobic, RespiratorFINAL        10/02/19 17:29   +  10/01/19   Heavy growth of mixed upper respiratory flora  10/02/19   Light growth of Serratia marcescens               Enterobacter species, Klebsiella (formerly Enterobacter)             aerogenes, and to a lesser extent Citrobacter and Serratia             species, may develop B-lactam resistance while on therapy with  B-lactam antibiotics. Even if initially susceptible, resistance             may develop due to induction of AmpC expression by B-lactams.             Exceptions to this include cefepime and carbapenems.    _____________________________________________________________________________                                  S.marcescens    ANTIBIOTICS                     MIC  INTRP      _____________________________________________________________________________  Amoxicillin/CA                 >16/8   R        Ampicillin                      >16    R        Ampicillin/sulbactam           >16/8   R        Aztreonam                       <=2    S        Cefazolin                       >16    R        Cefepime                        <=1    S        Cefoxitin                       >16    R        Ceftazidime                     <=2    S        Ceftriaxone                     <=1    S        Cefuroxime                      >16    R        Ciprofloxacin                 <=0.25   S        Ertapenem                     <=0.25   S        Gentamicin                      <=2    S        Levofloxacin                   <=0.5   S        Meropenem                      <=0.5   S  Piperacillin/Tazobactam        16/4    S        Tetracycline                    >8     R        _____________________________________________________________________________            S=SUSCEPTIBLE     I=INTERMEDIATE     R=RESISTANT                            N/S=NON-SUSCEPTIBLE  _____________________________________________________________________________      Basic Metabolic Panel [[id number]]  (Abnormal)  Collected: 10/02/19 1641    Specimen: Blood Updated: 10/02/19 1705     Glucose 138 mg/dL      BUN 40.9 mg/dL      Creatinine 2.1 mg/dL      Calcium 8.1 mg/dL      Sodium 811 mEq/L      Potassium 4.7 mEq/L      Chloride 104 mEq/L      CO2 28 mEq/L      Anion Gap 9.0    GFR [[id number]] Collected: 10/02/19 1641     Updated: 10/02/19 1705     EGFR 32.2    Phosphorus [[id number]] Collected: 10/02/19 1641    Specimen: Blood Updated: 10/02/19 1705     Phosphorus 2.9 mg/dL     Calcium, ionized [[id number]] Collected: 10/02/19 1641    Specimen: Blood Updated: 10/02/19 1654     Calcium, Ionized 2.37 mEq/L     CBC and differential [[id number]]  (Abnormal) Collected: 10/02/19 1641    Specimen: Blood Updated: 10/02/19 1649     WBC 12.96 x10 3/uL      Hgb 9.6 g/dL      Hematocrit 27.2 %      Platelets 111 x10 3/uL      RBC 3.27 x10 6/uL      MCV 86.9 fL      MCH 29.4 pg      MCHC 33.8 g/dL      RDW 13 %      MPV 12.0 fL      Neutrophils 80.1 %      Lymphocytes Automated 8.6 %      Monocytes 8.3 %      Eosinophils Automated 2.2 %      Basophils Automated 0.4 %      Immature Granulocytes 0.4 %      Nucleated RBC 0.0 /100 WBC      Neutrophils Absolute 10.39 x10 3/uL      Lymphocytes Absolute Automated 1.11 x10 3/uL      Monocytes Absolute Automated 1.08 x10 3/uL      Eosinophils Absolute Automated 0.28 x10 3/uL      Basophils Absolute Automated 0.05 x10 3/uL      Immature Granulocytes Absolute 0.05 x10 3/uL      Absolute NRBC 0.00 x10 3/uL     Glucose Whole Blood - POCT [[id number]]  (Abnormal) Collected: 10/02/19 1540     Updated: 10/02/19 1553     Whole Blood Glucose POCT 127 mg/dL     Ferritin [[id number]] Collected: 10/02/19 0422    Specimen: Blood Updated: 10/02/19 1318     Ferritin 145.21 ng/mL     Glucose Whole Blood - POCT [[id number]]  (Abnormal) Collected: 10/02/19 1228     Updated: 10/02/19 1230     Whole Blood Glucose POCT 127 mg/dL  CULTURE BLOOD AEROBIC AND ANAEROBIC [[id number]] Collected: 10/01/19 0254    Specimen:  Blood, Venipuncture Updated: 10/02/19 1221    Narrative:      The order will result in two separate 8-40ml bottles  Please do NOT order repeat blood cultures if one has been  drawn within the last 48 hours  UNLESS concerned for  endocarditis  AVOID BLOOD CULTURE DRAWS FROM CENTRAL LINE IF POSSIBLE  Indications:->Other  Other->Admission  ORDER#: [id number]                                    ORDERED BY: MENDIGUREN, IGN  SOURCE: Blood, Venipuncture left hand                COLLECTED:  10/01/19 02:54  ANTIBIOTICS AT COLL.:                                RECEIVED :  10/01/19 11:56  Culture Blood Aerobic and Anaerobic        PRELIM      10/02/19 12:21  10/02/19   No Growth after 1 day/s of incubation.      CULTURE BLOOD AEROBIC AND ANAEROBIC [[id number]] Collected: 10/01/19 0253    Specimen: Blood, Venipuncture Updated: 10/02/19 1221    Narrative:      The order will result in two separate 8-3ml bottles  Please do NOT order repeat blood cultures if one has been  drawn within the last 48 hours  UNLESS concerned for  endocarditis  AVOID BLOOD CULTURE DRAWS FROM CENTRAL LINE IF POSSIBLE  Indications:->Other  Other->Admission  ORDER#: [id number]                                    ORDERED BY: MENDIGUREN, IGN  SOURCE: Blood, Venipuncture left wrist               COLLECTED:  10/01/19 02:53  ANTIBIOTICS AT COLL.:                                RECEIVED :  10/01/19 12:12  Culture Blood Aerobic and Anaerobic        PRELIM      10/02/19 12:21  10/02/19   No Growth after 1 day/s of incubation.      Glucose Whole Blood - POCT [[id number]]  (Abnormal) Collected: 10/02/19 1106     Updated: 10/02/19 1210     Whole Blood Glucose POCT 125 mg/dL     Vitamin K44 [[id number]]  (Abnormal) Collected: 10/02/19 0422    Specimen: Blood Updated: 10/02/19 1103     Vitamin B-12 1,188 pg/mL     GFR [[id number]] Collected: 10/02/19 1012     Updated: 10/02/19 1057     EGFR 30.5    Phosphorus [[id number]] Collected: 10/02/19 1012     Updated: 10/02/19 1057      Phosphorus 2.7 mg/dL     Comprehensive metabolic panel [[id number]]  (Abnormal) Collected: 10/02/19 1012    Specimen: Blood Updated: 10/02/19 1057     Glucose 143 mg/dL      BUN 41.6 mg/dL      Creatinine 2.2 mg/dL      Sodium 606 mEq/L      Potassium 4.4 mEq/L  Chloride 104 mEq/L      CO2 26 mEq/L      Calcium 8.0 mg/dL      Protein, Total 5.4 g/dL      Albumin 2.7 g/dL      AST (SGOT) 79 U/L      ALT 85 U/L      Alkaline Phosphatase 93 U/L      Bilirubin, Total 0.6 mg/dL      Globulin 2.7 g/dL      Albumin/Globulin Ratio 1.0     Anion Gap 13.0    Magnesium [[id number]] Collected: 10/02/19 1012    Specimen: Blood Updated: 10/02/19 1057     Magnesium 1.9 mg/dL     Prothrombin time/INR [[id number]] Collected: 10/02/19 1012    Specimen: Blood Updated: 10/02/19 1037     PT 14.1 sec      PT INR 1.1    Calcium, ionized [[id number]] Collected: 10/02/19 1012     Updated: 10/02/19 1034     Calcium, Ionized 2.34 mEq/L     Glucose Whole Blood - POCT [[id number]]  (Abnormal) Collected: 10/02/19 1005     Updated: 10/02/19 1017     Whole Blood Glucose POCT 132 mg/dL     Folate [[id number]] Collected: 10/02/19 0422    Specimen: Blood Updated: 10/02/19 0916     Folate 15.4 ng/mL     Glucose Whole Blood - POCT [[id number]]  (Abnormal) Collected: 10/02/19 0844     Updated: 10/02/19 0916     Whole Blood Glucose POCT 119 mg/dL     Hemoglobin Z3G [[id number]]  (Abnormal) Collected: 10/02/19 0422    Specimen: Blood Updated: 10/02/19 0856     Hemoglobin A1C 6.4 %      Average Estimated Glucose 137.0 mg/dL     Hemolysis index [[id number]] Collected: 10/02/19 0422     Updated: 10/02/19 0849     Hemolysis Index 7    IRON PROFILE [[id number]]  (Abnormal) Collected: 10/02/19 0422     Updated: 10/02/19 0849     Iron 17 ug/dL      UIBC 416 ug/dL      TIBC 606 ug/dL      Iron Saturation 8 %               Rads:   Radiological Procedure reviewed.   US Renal Kidney Bladder Complete    Result Date: 10/02/2019   No hydronephrosis. Rocky Crafts, MD   10/02/2019 10:13 PM

## 2019-10-03 NOTE — Progress Notes (Signed)
Infectious Disease            Progress Note    10/03/2019   Keith Gates BJY:[id number] is a 63 y.o. male, history of diabetes mellitus, hypertension, hyperlipidemia, stroke, coronary artery disease status post coronary artery bypass grafting who was admitted after cardiac arrest, CPR, with sepsis, UTI, possible pneumonia, aspiration.    Subjective:     Keith Gates today Symptoms: Afebrile, leukocytosis resolved, intubated, sputum cultures growing Serratia. Other review of system is non contributory.    Objective:     Blood pressure 103/69, pulse 74, temperature 98.8 F (37.1 C), temperature source Core, resp. rate (!) 5, height 1.803 m (5\' 11" ), weight 80.7 kg (177 lb 14.6 oz), SpO2 100 %.    General Appearance: Intubated, sedated  HEENT: Pallor positive, Anicteric sclera.   Neck: Supple  Lungs:Decreased breath sound at bases.   Chest Wall: Symmetric chest wall expansion.   Heart : S1 and S2.   Abdomen: Abdomen is soft, bowel sounds positive.  Neurological: Intubated and sedated    Laboratory And Diagnostic Studies:     Recent Labs     10/03/19  1017 10/03/19  0310   WBC 10.18* 12.34*   Hgb 8.5* 9.0*   Hematocrit 25.5* 26.7*   Platelets 93* 103*   Neutrophils 76.0 74.9     Recent Labs     10/03/19  1017 10/03/19  0310   Sodium 141 139   Potassium 4.3 4.4   Chloride 106 105   CO2 24 25   BUN 42.0* 43.0*   Creatinine 1.8* 1.9*   Glucose 157* 164*   Calcium 7.5* 7.8*     Recent Labs     10/03/19  0310 10/02/19  1012   AST (SGOT) 63* 79*   ALT 66* 85*   Alkaline Phosphatase 97 93   Protein, Total 5.1* 5.4*   Albumin 2.5* 2.7*   Bilirubin, Total 0.5 0.6       Current Med's:     Current Facility-Administered Medications   Medication Dose Route Frequency    insulin glargine  10 Units Subcutaneous QHS    insulin lispro  1-5 Units Subcutaneous Q4H SCH    iron sucrose  300 mg Intravenous Q24H SCH    levETIRAcetam  500 mg Intravenous Q12H SCH    piperacillin-tazobactam  2.25 g Intravenous Q6H     sodium chloride  1,000 mL Intravenous Once       Lines/Drains:     Patient Lines/Drains/Airways Status    Active Lines, Drains and Airways     Name:   Placement date:   Placement time:   Site:   Days:    CVC Triple Lumen 09/30/19 Left Internal jugular   09/30/19    2240    Internal jugular   2    Peripheral IV 09/30/19 18 G Anterior;Distal;Left Upper Arm   09/30/19    1342    Upper Arm   2    Peripheral IV 09/30/19 20 G Distal;Posterior;Right Forearm   09/30/19    1444    Forearm   2    Urethral Catheter Double-lumen 16 Fr.   09/30/19    1413    Double-lumen   2    ETT  7 mm   09/30/19    1402     2    Pacer Wires   09/30/19    1544    Ventricular   2    Venous Sheath 6 Fr. Right Femoral  09/30/19    1543    Femoral   2    NG/OG Tube Orogastric 18 Fr. 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 Roxan Hockey, M.D.,FACP  10/03/2019  12:04 PM          *This note was generated by the Epic EMR system/ Dragon speech recognition and may contain inherent errors or omissions not intended by the user. Grammatical errors, random word insertions, deletions, pronoun errors and incomplete sentences are occasional consequences of this technology due to software limitations. Not all errors are caught or corrected. If there are questions or concerns about the content of this  note or information contained within the body of this dictation they should be addressed directly with the author for clarification

## 2019-10-03 NOTE — Progress Notes (Signed)
Case Manager talked to Lisa Underwood with [facility] at [phone] and she told the CM that she has no beds for patient to be transferred to the [facility] facility.  As per Lisa it is okay for patient to stay in the [facility] for further care.  CM will follow up as needed.     Love Chowning, RN MSN ACM  Clinical Case Manager II  [phone]

## 2019-10-03 NOTE — Progress Notes (Signed)
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  EP will continue to assess for PM need depending on neurological recovery.   Patient rewarmed, need to assess neuro status, currently on Versed gtt as he had some seizure like activity   Remains intubated and on pressors   Pending neuro recovery, will defer on further echo, cath, EP testing      Medications:      Scheduled Meds: PRN Meds:    famotidine, 20 mg, Intravenous, Q24H SCH  heparin (porcine), 5,000 Units, Subcutaneous, Q12H SCH  insulin glargine, 10 Units, Subcutaneous, QHS  insulin lispro, 1-5 Units, Subcutaneous, Q4H SCH  levETIRAcetam, 500 mg, Intravenous, Q12H SCH  piperacillin-tazobactam, 2.25 g, Intravenous, Q6H  sodium chloride, 1,000 mL, Intravenous, Once        Continuous Infusions:   midazolam 10 mg/hr (10/03/19 0609)    norepinephrine (LEVOPHED) infusion 5 mcg/min (10/03/19 0519)    propofol 10 mcg/kg/min (10/02/19 2030)    dextrose, 15 g of glucose, PRN    And  dextrose, 12.5 g, PRN    And  glucagon (rDNA), 1 mg, PRN  fentaNYL (PF), 50 mcg, Q1H PRN  fentaNYL (PF), 50 mcg, Q1H PRN  midazolam, 2 mg, Q1H PRN              Subjective:   Intubated, on IV versded gtt.  Rewarmed.  Dobutamine off, levophed titrating down    Physical Exam:     Vitals:    10/03/19 0700   BP: 106/63   Pulse: 75   Resp: (!) 6   Temp:    SpO2: 100%     Temp (24hrs), Avg:99  F (37.2 C), Min:98.8 F (37.1 C), Max:99.3 F (37.4 C)      Telemetry reviewed no changes.     Intake and Output Summary (Last 24 hours) at Date Time    Intake/Output Summary (Last 24 hours) at 10/03/2019 0825  Last data filed at 10/03/2019 0600  Gross per 24 hour   Intake 1342.3 ml   Output 1280 ml   Net 62.3 ml       General Appearance:  Intubated  Head:  normocephalic  Lungs:  Ventilator support  Chest Wall:  No tenderness or deformity  Heart:  Regular, no murmur or gallop   Extremities:  No cyanosis, clubbing or edema  Neurologic:  Unresponsive on sedation    Labs:     Recent Labs   Lab 10/01/19  0903 10/01/19  0253 09/30/19  1412   Troponin I 0.93* 1.09* 0.03             Recent Labs   Lab 10/03/19  0310   Bilirubin, Total 0.5   Protein, Total 5.1*   Albumin  2.5*   ALT 66*   AST (SGOT) 63*     Recent Labs   Lab 10/03/19  0310   Magnesium 1.8     Recent Labs   Lab 10/02/19  1012  09/30/19  1411   PT 14.1  More results in Results Review 14.3   PT INR 1.1  More results in Results Review 1.1   PTT  --   --  29   More results in Results Review = values in this interval not displayed.     Recent Labs   Lab 10/03/19  0310 10/02/19  1641 10/02/19  0422   WBC 12.34* 12.96* 13.18*   Hgb 9.0* 9.6* 9.7*   Hematocrit 26.7* 28.4* 28.7*   Platelets 103* 111* 111*     Recent Labs   Lab 10/03/19  0310 10/02/19  2319 10/02/19  1641   Sodium 139 140 141   Potassium 4.4 4.4 4.7   Chloride 105 105 104   CO2 25 25 28    BUN 43.0* 43.6* 50.4*   Creatinine 1.9* 2.0* 2.1*   EGFR 36.1 34.1 32.2   Glucose 164* 170* 138*   Calcium 7.8* 8.0* 8.1*           Invalid input(s): FREET4    .  Lab Results   Component Value Date    BNP 499.8 (H) 10/02/2019      Estimated Creatinine Clearance: 43.5 mL/min (A) (based on SCr of 1.9 mg/dL (H)).    Weight Monitoring 08/14/2018 11/10/2018 09/30/2019 09/30/2019 10/01/2019 10/02/2019 10/03/2019   Height 180.3 cm - 180.3 cm - - - -   Height Method Stated - Stated - - - -   Weight 63.4 kg 65 kg 80.6 kg  76.9 kg 76.4 kg 80.3 kg 80.7 kg   Weight Method Bed Scale Actual Bed Scale - Bed Scale Bed Scale Bed Scale   BMI (calculated) 19.5 kg/m2 - 24.8 kg/m2 - - - -         Imaging:   Radiological Procedure reviewed.                Signed by    Keitha Butte, MD      [facility]  NP Spectralink [phone] (8am-5pm)  MD Spectralink [phone] (8am-5pm)  After hours, non urgent consult line [phone]  After Hours, urgent consults or to reach the on-call MD [phone]

## 2019-10-03 NOTE — Progress Notes (Signed)
Artic Sun removed at 1200.   Ventricular pace wires and Venous sheath removed @ 1500. Pressure applied and no bleeding occurred. Pressure dressing applied to site.   CT scan at 1830. Trip was uneventful. VSS.

## 2019-10-03 NOTE — Progress Notes (Signed)
10/03/19 0430   Provider Notification   Reason for Communication Review case   Provider Name Cheral Almas NP   Provider Role Consulting physician   Method of Communication Call   Response No new orders   Notification Time 0425   Mag 1.8. Didn't order replacement.

## 2019-10-03 NOTE — Consults (Signed)
Historuy of present illness:   History: 63 year old gentleman with PMH of diabetes, hypertension, hypercholesterolemia, prior CVA, prior CABG earlier this year, presents with cardiorespiratory arrest.  As per the notes the patient had shortness of breath.  No complaints of chest pain.  When wife checked on him around midday he was weak and unarousable in his bed.  There was no trauma.  EMS was called.  They found the patient on well minimally responsive, bradycardic, then asystolic.  CPR/ACLS was done for about 6 minutes  At the time of my evalution in the ICU s/p arctic sun  The patient is at times biting on the tube. He winces to moderate pain.   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 No pertinent family history.       Social History:     Social History - Main Topic   Social History           Socioeconomic History    Marital status: Married     Spouse name: Not on file    Number of children: Not on file    Years of education: Not on file    Highest education level: Not on file   Occupational History    Not on file   Social Needs    Financial resource strain: Not on file    Food insecurity     Worry: Not on file     Inability: Not on file    Transportation needs     Medical: Not on file     Non-medical: Not on file   Tobacco Use    Smoking status: Former Smoker      Years: 30.00     Quit date: 10/17/2002     Years since quitting: 16.9    Smokeless tobacco: Never Used   Substance and Sexual Activity    Alcohol use: Yes    Drug use: No    Sexual activity: Not on file   Lifestyle    Physical activity     Days per week: Not on file     Minutes per session: Not on file    Stress: Not on file   Relationships    Social connections     Talks on phone: Not on file     Gets together: Not on file     Attends religious service: Not on file     Active member of club or organization: Not on file     Attends meetings of clubs or organizations: Not on file  Relationship status: Not on file    Intimate partner violence     Fear of current or ex partner: Not on file     Emotionally abused: Not on file     Physically abused: Not on file     Forced sexual activity: Not on file   Other Topics Concern    Not on file   Social History Narrative    Not on file          Allergies:   No Known Allergies    Medications:          Medications Prior to Admission   Medication Sig    atorvastatin (LIPITOR) 40 MG tablet Take 40 mg by mouth daily    metFORMIN (GLUCOPHAGE) 1000 MG tablet Take 1,000 mg by mouth 2 (two) times daily with meals    midodrine (PROAMATINE) 5 MG tablet Take 5 mg by mouth 2 (two) times daily with meals    SITagliptin (JANUVIA) 100 MG tablet Take 100 mg by mouth daily     Scheduled Meds:         Current Facility-Administered Medications   Medication Dose Route Frequency    atropine        calcium chloride  1 g Intravenous Once    dextrose        famotidine  20 mg Intravenous Q12H SCH    heparin (porcine)  5,000 Units Subcutaneous Q12H SCH    meropenem  500 mg Intravenous Q8H    sodium bicarbonate  100 mEq Intravenous Once    sodium chloride  1,000 mL Intravenous Once     Continuous Infusions:   IV fluids with sodium bicarbonate 100 mL/hr (09/30/19 2314)    DOBUTamine (DOBUTREX) 2 mg/mL IVPB 250 mL 20 mcg/kg/min (09/30/19  1959)    EPINEPHrine Stopped (09/30/19 1858)    fentaNYL 100 mcg/hr (09/30/19 2140)    insulin regular 4 Units/hr (09/30/19 2305)    midazolam 4 mg/hr (09/30/19 2140)    norepinephrine (LEVOPHED) infusion 15 mcg/min (09/30/19 2327)     PRN Meds:.fentaNYL (PF), fentaNYL (PF), midazolam    Review of Systems:   A comprehensive review of systems was: Not to assess from patient due to unresponsiveness.  From history, no GI bleed, positive shortness of breath, no chest pain, no diarrhea.  No fever.  No Covid exposures      Objective:  Last 24 Hour Vital Signs:  Heart Rate:  [70-89] 73  Resp Rate:  [0-19] 17  BP: (82-171)/(50-118) 123/59  FiO2:  [40 %-100 %] 40 %    Physical Exam:      Unresponsive to verbal stimuli  on Vent  Winces to moderate pain  Head atraumatic Normocephalic  Eyes - Pupils react to light  Motor - withdraws left arm to moderate pain  refles b/l symmetrical  Sensory- withdraws left arm to pain  Cerebellar functions could not be tested        Scheduled Meds:  Current Facility-Administered Medications   Medication Dose Route Frequency    insulin glargine  10 Units Subcutaneous QHS    insulin lispro  1-5 Units Subcutaneous Q4H [facility]    iron sucrose  300 mg Intravenous Q24H SCH    levETIRAcetam  500 mg Intravenous Q12H SCH    piperacillin-tazobactam  2.25 g Intravenous Q6H    sodium chloride  1,000 mL Intravenous Once       Continuous Infusions:   norepinephrine (LEVOPHED) infusion 5 mcg/min (10/03/19 1906)    propofol  10 mcg/kg/min (10/03/19 1906)       PRN Meds:  Nursing communication: Adult Hypoglycemia Treatment Algorithm **AND** dextrose **AND** dextrose **AND** glucagon (rDNA)    Last 24 Hour Labs:  Results     Procedure Component Value Units Date/Time    Glucose Whole Blood - POCT [[id number]]  (Abnormal) Collected: 10/03/19 1653     Updated: 10/03/19 1826     Whole Blood Glucose POCT 146 mg/dL     CULTURE BLOOD AEROBIC AND ANAEROBIC [[id number]] Collected: 10/01/19 0253    Specimen:  Blood, Venipuncture Updated: 10/03/19 1221    Narrative:      The order will result in two separate 8-55ml bottles  Please do NOT order repeat blood cultures if one has been  drawn within the last 48 hours  UNLESS concerned for  endocarditis  AVOID BLOOD CULTURE DRAWS FROM CENTRAL LINE IF POSSIBLE  Indications:->Other  Other->Admission  ORDER#: [id number]                                    ORDERED BY: MENDIGUREN, IGN  SOURCE: Blood, Venipuncture left wrist               COLLECTED:  10/01/19 02:53  ANTIBIOTICS AT COLL.:                                RECEIVED :  10/01/19 12:12  Culture Blood Aerobic and Anaerobic        PRELIM      10/03/19 12:21  10/02/19   No Growth after 1 day/s of incubation.  10/03/19   No Growth after 2 day/s of incubation.      CULTURE BLOOD AEROBIC AND ANAEROBIC [[id number]] Collected: 10/01/19 0254    Specimen: Blood, Venipuncture Updated: 10/03/19 1221    Narrative:      The order will result in two separate 8-55ml bottles  Please do NOT order repeat blood cultures if one has been  drawn within the last 48 hours  UNLESS concerned for  endocarditis  AVOID BLOOD CULTURE DRAWS FROM CENTRAL LINE IF POSSIBLE  Indications:->Other  Other->Admission  ORDER#: [id number]                                    ORDERED BY: MENDIGUREN, IGN  SOURCE: Blood, Venipuncture left hand                COLLECTED:  10/01/19 02:54  ANTIBIOTICS AT COLL.:                                RECEIVED :  10/01/19 11:56  Culture Blood Aerobic and Anaerobic        PRELIM      10/03/19 12:21  10/02/19   No Growth after 1 day/s of incubation.  10/03/19   No Growth after 2 day/s of incubation.      Glucose Whole Blood - POCT [[id number]]  (Abnormal) Collected: 10/03/19 1138     Updated: 10/03/19 1142     Whole Blood Glucose POCT 146 mg/dL     Cell MorpHology [[id number]]  (Abnormal) Collected: 10/03/19 1017     Updated: 10/03/19 1114     Cell Morphology Abnormal     Platelet Estimate Decreased  Ovalocytes Present    CBC and  differential [[id number]]  (Abnormal) Collected: 10/03/19 1017    Specimen: Blood Updated: 10/03/19 1113     WBC 10.18 x10 3/uL      Hgb 8.5 g/dL      Hematocrit 40.9 %      Platelets 93 x10 3/uL      RBC 2.95 x10 6/uL      MCV 86.4 fL      MCH 28.8 pg      MCHC 33.3 g/dL      RDW 13 %      MPV 12.1 fL      Neutrophils 76.0 %      Lymphocytes Automated 13.4 %      Monocytes 7.2 %      Eosinophils Automated 2.7 %      Basophils Automated 0.4 %      Immature Granulocytes 0.3 %      Nucleated RBC 0.0 /100 WBC      Neutrophils Absolute 7.75 x10 3/uL      Lymphocytes Absolute Automated 1.36 x10 3/uL      Monocytes Absolute Automated 0.73 x10 3/uL      Eosinophils Absolute Automated 0.27 x10 3/uL      Basophils Absolute Automated 0.04 x10 3/uL      Immature Granulocytes Absolute 0.03 x10 3/uL      Absolute NRBC 0.00 x10 3/uL     Basic Metabolic Panel [[id number]]  (Abnormal) Collected: 10/03/19 1017    Specimen: Blood Updated: 10/03/19 1103     Glucose 157 mg/dL      BUN 95.6 mg/dL      Creatinine 1.8 mg/dL      Calcium 7.5 mg/dL      Sodium 213 mEq/L      Potassium 4.3 mEq/L      Chloride 106 mEq/L      CO2 24 mEq/L      Anion Gap 11.0    Phosphorus [[id number]]  (Abnormal) Collected: 10/03/19 1017    Specimen: Blood Updated: 10/03/19 1103     Phosphorus 2.2 mg/dL     GFR [[id number]] Collected: 10/03/19 1017     Updated: 10/03/19 1103     EGFR 38.5    Calcium, ionized [[id number]] Collected: 10/03/19 1017    Specimen: Blood Updated: 10/03/19 1051     Calcium, Ionized 2.33 mEq/L     Glucose Whole Blood - POCT [[id number]]  (Abnormal) Collected: 10/03/19 0723     Updated: 10/03/19 0817     Whole Blood Glucose POCT 154 mg/dL     Phosphorus [[id number]] Collected: 10/03/19 0310    Specimen: Blood Updated: 10/03/19 0400     Phosphorus 2.4 mg/dL     Comprehensive metabolic panel [[id number]]  (Abnormal) Collected: 10/03/19 0310    Specimen: Blood Updated: 10/03/19 0400     Glucose 164 mg/dL      BUN 18.8 mg/dL      Creatinine 1.9  mg/dL      Sodium 416 mEq/L      Potassium 4.4 mEq/L      Chloride 105 mEq/L      CO2 25 mEq/L      Calcium 7.8 mg/dL      Protein, Total 5.1 g/dL      Albumin 2.5 g/dL      AST (SGOT) 63 U/L      ALT 66 U/L      Alkaline Phosphatase 97 U/L      Bilirubin, Total 0.5 mg/dL  Globulin 2.6 g/dL      Albumin/Globulin Ratio 1.0     Anion Gap 9.0    Magnesium [[id number]] Collected: 10/03/19 0310    Specimen: Blood Updated: 10/03/19 0400     Magnesium 1.8 mg/dL     GFR [[id number]] Collected: 10/03/19 0310     Updated: 10/03/19 0400     EGFR 36.1    Glucose Whole Blood - POCT [[id number]]  (Abnormal) Collected: 10/03/19 0318     Updated: 10/03/19 0344     Whole Blood Glucose POCT 156 mg/dL     CBC and differential [[id number]]  (Abnormal) Collected: 10/03/19 0310     Updated: 10/03/19 0333     WBC 12.34 x10 3/uL      Hgb 9.0 g/dL      Hematocrit 36.6 %      Platelets 103 x10 3/uL      RBC 3.08 x10 6/uL      MCV 86.7 fL      MCH 29.2 pg      MCHC 33.7 g/dL      RDW 14 %      MPV 12.0 fL      Neutrophils 74.9 %      Lymphocytes Automated 14.5 %      Monocytes 7.5 %      Eosinophils Automated 2.3 %      Basophils Automated 0.3 %      Immature Granulocytes 0.5 %      Nucleated RBC 0.0 /100 WBC      Neutrophils Absolute 9.24 x10 3/uL      Lymphocytes Absolute Automated 1.79 x10 3/uL      Monocytes Absolute Automated 0.93 x10 3/uL      Eosinophils Absolute Automated 0.28 x10 3/uL      Basophils Absolute Automated 0.04 x10 3/uL      Immature Granulocytes Absolute 0.06 x10 3/uL      Absolute NRBC 0.00 x10 3/uL     Calcium, ionized [[id number]] Collected: 10/03/19 0310    Specimen: Blood Updated: 10/03/19 0331     Calcium, Ionized 2.30 mEq/L     Basic Metabolic Panel [[id number]]  (Abnormal) Collected: 10/02/19 2319    Specimen: Blood Updated: 10/02/19 2340     Glucose 170 mg/dL      BUN 33.2 mg/dL      Creatinine 2.0 mg/dL      Calcium 8.0 mg/dL      Sodium 951 mEq/L      Potassium 4.4 mEq/L      Chloride 105 mEq/L      CO2 25 mEq/L       Anion Gap 10.0    Phosphorus [[id number]] Collected: 10/02/19 2319    Specimen: Blood Updated: 10/02/19 2340     Phosphorus 2.4 mg/dL     GFR [[id number]] Collected: 10/02/19 2319     Updated: 10/02/19 2340     EGFR 34.1    Calcium, ionized [[id number]] Collected: 10/02/19 2319    Specimen: Blood Updated: 10/02/19 2328     Calcium, Ionized 2.33 mEq/L     Glucose Whole Blood - POCT [[id number]]  (Abnormal) Collected: 10/02/19 2320     Updated: 10/02/19 2324     Whole Blood Glucose POCT 166 mg/dL     Glucose Whole Blood - POCT [[id number]]  (Abnormal) Collected: 10/02/19 1921     Updated: 10/02/19 1936     Whole Blood Glucose POCT 136 mg/dL           Last 24 Hour Radiology:  Radiology Results (24 Hour)     Procedure Component Value Units Date/Time    CT Head WO Contrast [[id number]] Collected: 10/03/19 1822    Order Status: Completed Updated: 10/03/19 1830    Narrative:      HISTORY: Altered mental status, cardiac arrest    TECHNIQUE: Axial CT images of the head were obtained, without  intravenous contrast.    The following dose reduction techniques were utilized: automated  exposure control and/or adjustment of the mA and/or kV according to  patient size, and the use of iterative reconstruction technique.    COMPARISON: 08/05/2018    FINDINGS: There is generalized cerebral volume loss. 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 There is no acute  intracranial hemorrhage or mass effect. Gray-white differentiation is  grossly maintained. The ventricles have not significantly changed in  size. The basilar cisterns remain patent. Atherosclerotic vascular  calcifications are identified. There is no mass effect or midline shift.  There is no evidence of an acute intracranial hemorrhage or territorial  infarct. The osseous structures are unremarkable. There are inflammatory  changes in the paranasal sinuses. There is also soft tissue density  involving the posterior aspects of the nasal  cavity/nasopharynx,  partially imaged.      Impression:         1. No acute intracranial abnormality is identified.  2. Chronic ischemic changes.    Nicoletta Dress, MD   10/03/2019 6:28 PM    US Renal Kidney Bladder Complete [[id number]] Collected: 10/02/19 2210    Order Status: Completed Updated: 10/02/19 2215    Narrative:      History: ARF.    COMPARISON: None.    FINDINGS:    Evaluation is limited due to patient body habitus. The right kidney  measures 11.8 cm in length. The left kidney measures 10.4 cm in length.  The bladder is nondistended. There is no hydronephrosis.      Impression:       No hydronephrosis.    Rocky Crafts, MD   10/02/2019 10:13 PM          Assessment:    Principal Problem:  Heart block    Active Problems:   Patient Active Problem List   Diagnosis    Non-STEMI (non-ST elevated myocardial infarction)    DKA (diabetic ketoacidoses)    Heart block    Cardiac arrest    Iron deficiency anemia, unspecified iron deficiency anemia type       Plan:  63 year old gentleman with PMH of diabetes, hypertension, hypercholesterolemia, prior CVA, prior CABG earlier this year, presents with cardiorespiratory arrest.  As per the notes the patient had shortness of breath.  No complaints of chest pain.  When wife checked on him around midday he was weak and unarousable in his bed.  There was no trauma.  EMS was called.  They found the patient on well minimally responsive, bradycardic, then asystolic.  CPR/ACLS was done for about 6 minutes  At the time of my evalution in the ICU s/p arctic sun  The patient is at times biting on the tube. He winces to moderate pain.   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

## 2019-10-03 NOTE — Progress Notes (Signed)
[facility]- Critical Care Note     ICU Daily Progress Note        Date Time: 10/03/19 11:13 AM  Patient Name: Keith Gates  Attending Physician: Durward Fortes, MD  Room: IC03/IC03-A   Admit Date: 09/30/2019  LOS: 3 days      Assessment/Plan:     #Neuro: h/o prior stroke, anoxic encephalopathy, will finish Arctic sun at noon.  Plan for head ct, EEG later today after Arctic sun finishes. Consult neurology Dr. Dorene Grebe to follow.     #pulm: Respiratory failure secondary to cardiac arrest. +Serratia Marcescens pneumonia,  Continue mechanical ventilation.  Not a candidate for breathing trial secondary to undergoing Arctic sun.    #cardiac: Cardiac arrest with complete heart block in the setting of sepsis and septic shock.   h/o NSTEMI 01/2018, with CABG 01/2019. Ischemic cardiomyopathy, EF 20-25%. HLD, complete heart block noted on admission.   Appreciate [facility] evaluation recommendation. Plan for removal of temporary pacing wire.    #DM:  diabetes mellitus, Hemoglobin A1c:6.4%,  resolved DKA, continue lantus and SSI.     #nutrition: npo, appreciate nutritionist recommendation. Continue tube feeds    # renal: acute renal failure, appreciate [facility], Dr. Kittie Plater,  nephrology evaluation recommendation. Improving renal dysfunction    #Heme: thrombocytopenia from sepsis    #ID: + urinary tract infection, + urine analysis. + Serratia marcescens pneumonia. Sepsis, septic shock. Continue with antibiotics  COVID negative      #Prophylaxis:   GI Prophylaxis:  +  VTE Prophylaxis:+      #Code Status: full code     Addendum:  3:30pm  Discussed with patient's wife and 2 children (son Rondie and daughter and daughter's life partner) with [place] translator via telephone. Of note, pt's children speak [place].  Wife speaks [place].  Went over patient's medical condition and therapy.  Informed them of Arctic sun, will need to await after cooling therapy to see if patient has improvement in  neurological function.  Answered her questions.    Subjective:   63 year old male with history of diabetes, hypertension, prior stroke, coronary artery disease with coronary artery bypass graft 4/202 presents with cardiac arrest from complete heart block in the setting of sepsis and septic shock.    Intubated, sedated,  Finishing Arctic sun cooling protocol.    Medications:   Scheduled Meds:  Current Facility-Administered Medications   Medication Dose Route Frequency    famotidine  20 mg Intravenous Q24H SCH    heparin (porcine)  5,000 Units Subcutaneous Q12H SCH    insulin glargine  10 Units Subcutaneous QHS    insulin lispro  1-5 Units Subcutaneous Q4H [facility]    iron sucrose  300 mg Intravenous Q24H SCH    levETIRAcetam  500 mg Intravenous Q12H SCH    piperacillin-tazobactam  2.25 g Intravenous Q6H    sodium chloride  1,000 mL Intravenous Once         Continuous Infusions:   midazolam 10 mg/hr (10/03/19 0609)    norepinephrine (LEVOPHED) infusion 5 mcg/min (10/03/19 0519)    propofol 10 mcg/kg/min (10/02/19 2030)        Physical Exam:     Vitals:    10/03/19 1000   BP: 100/71   Pulse: 81   Resp: (!) 8   Temp:    SpO2: 100%         Intake/Output Summary (Last 24 hours) at 10/03/2019 1113  Last data filed at 10/03/2019 1004  Gross per  24 hour   Intake 1432.3 ml   Output 1055 ml   Net 377.3 ml       General Appearance: Intubated, older than stated age ,sedated, edentulous  CV: Regular rate  Pulm: No accessory use  Abd: Nondistended  Extremities: No edema    Data:     Vent Settings:    Vent Settings  Vent Mode: PRVC  FiO2: 40 %  Resp Rate (Set): 18  Vt (Set, mL): 450 mL  PIP Observed (cm H2O): 20 cm H2O  PEEP/EPAP: 5 cm H20  Mean Airway Pressure: 9 cmH20    Labs:       Labs (last 72 hours):  Recent Labs     10/03/19  1017 10/03/19  0310 10/02/19  1641   WBC 10.18* 12.34* 12.96*   Hgb 8.5* 9.0* 9.6*   Hematocrit 25.5* 26.7* 28.4*     Recent Labs     10/02/19  1012 10/02/19  0422 10/01/19  2305  09/30/19  1411    PT 14.1 14.7 15.3*   < > 14.3   PT INR 1.1 1.2* 1.2*   < > 1.1   PTT  --   --   --   --  29    < > = values in this interval not displayed.    Recent Labs     10/03/19  1017 10/03/19  0310 10/02/19  2319  10/02/19  1012 10/02/19  0422   Sodium 141 139 140   < > 143 143   Potassium 4.3 4.4 4.4   < > 4.4 4.5   Chloride 106 105 105   < > 104 105   CO2 24 25 25    < > 26 25   BUN 42.0* 43.0* 43.6*   < > 47.3* 52.8*   Creatinine 1.8* 1.9* 2.0*   < > 2.2* 2.5*   Glucose 157* 164* 170*   < > 143* 172*   Calcium 7.5* 7.8* 8.0*   < > 8.0* 8.1*   Magnesium  --  1.8  --   --  1.9 1.6   Phosphorus 2.2* 2.4 2.4   < > 2.7 2.8    < > = values in this interval not displayed.               Rads:     Radiological Imaging personally reviewed,and agree with radiology report including:      CXR    09/30/2019  "Lines in good position. Hypoventilation with left basilar atelectasis.   No significant consolidation or pneumothorax. "    I have personally reviewed the patients history and 24 hour interval events, along with vitals, labs, radiology images and nursing. So far today I have spent _41_ minutes providing care for this patient excluding teaching and billable procedures, and not overlapping with any other providers.    Signed by: Lawernce Ion, MD  Date/Time: 10/03/19 11:13 AM    *This note was generated by the Epic EMR system/ Dragon speech recognition and may contain inherent errors or omissions not intended by the user. Grammatical errors, random word insertions, deletions, pronoun errors and incomplete sentences are occasional consequences of this technology due to software limitations. Not all errors are caught or corrected. If there are questions or concerns about the content of this note or information contained within the body of this dictation they should be addressed directly with the author for clarification

## 2019-10-03 NOTE — Progress Notes (Signed)
Nutritional Support Services  Nutrition Assessment    Keith Gates 63 y.o. male   MRN: [id number]    Summary of Nutrition Plan of Care & Recommendations:  1- Recommend increase TF order of Vital AF 1.2 by 10 ml every hrs to goal 65 ml/hr to better meet nutritional needs.    2- Ongoing 60 ml water flush every 6 hrs for tube patency with additional water flushes as per MD    -----------------------------------------------------------------------------------------------------------------                                                      Assessment Data:   Referral Source: Per Policy  Reason for Referral: new tube feeds    Subjective Nutrition: Intubated/sedated  Hospital Admission: 63yom admitted with cardiac arrest with subsequent complete heart block.    Medical Hx:  has a past medical history of Diabetes mellitus, Elevated cholesterol, Hypertension, and Stroke.  PSH: has a past surgical history that includes Coronary artery bypass graft (01/2018) and Temporary Pacer Insertion (N/A, 09/30/2019).     Diet Hx: UTA  Social Hx: lives with spouse at private residence    Orders Placed This Encounter   Procedures   . Diet NPO effective now   . Tube feeding-Continuous     Intake: NPO  Enteral: Vital AF at 45 ml/hr goal ordered  Indication: intubation    ANTHROPOMETRIC  Anthropometrics  Height: 180.3 cm (5\' 11" )  Weight: 80.7 kg (177 lb 14.6 oz)  Weight Change: 0.5  IBW/kg (Calculated) Male: 78.21 kg  IBW/kg (Calculated) Male: 70.43 kg  BMI (calculated): 24.8    Nutrition Focused Physical Exam (NFPE):   RD unable to perform NFPE  Edema: generalized non pitting  Skin: no pressure injuries documented  GI function: SND, last bm 12/17    ESTIMATED NEEDS  Estimated Energy Needs  Total Energy Estimated Needs: 1814 kcal  Method for Estimating Needs: [employer] 2003 using 80.7 kg, MVe 8.3, 37.1 Tmax    Estimated Protein Needs  Total Protein Estimated Needs: 96-112 g  Method for Estimating Needs: 1.2-1.4 g/kg    Fluid  Needs  Total Fluid Estimated Needs: 1814 ml  Method for Estimating Needs: 1 ml/kcal    Pertinent Medications: reviewed  IVF:  Propofol at 4.8 ml/hr providing 126 kcal/d  . norepinephrine (LEVOPHED) infusion 5 mcg/min (10/03/19 1514)   . propofol 10 mcg/kg/min (10/03/19 1410)     Pertinent labs: reviewed: BUN/Crea  42.0/1.8, Phos 2.2    Learning Needs: none at this time due to intubation    Nutrition Assessment Summary: Tube feed as ordered will provide 1188 kcal (1314 kcal total with current propofol) and 74 g protein based on 22 hr infusion.  Recommend increase current TF order to 65 ml/hr to provide 1716 kcal (1842 kcal total with current propofol), 107 g protein and 1159 ml free water to better meet nutritional needs.                                                              Nutrition Diagnosis      Inadequate oral intake as related to intubation as  evidenced by NPO status with tube feeds                                                              Intervention     Summary of Nutrition Plan of Care & Recommendations:  1- Recommend increase TF order of Vital AF 1.2 by 10 ml every hrs to goal 65 ml/hr to better meet nutritional needs.    2- Ongoing 60 ml water flush every 6 hrs for tube patency with additional water flushes as per MD       Goals: Pt will receive >75% of estimated nutritional needs via tube feeds within 5 days                                                             Monitoring      TF tolerance/provision of intake/GI/labs                                                        Evaluation     Initial nutrition goals established    Maurice Small MS RD  Clinical Dietitian  Spectralink [phone]  Office: [phone]

## 2019-10-03 NOTE — Progress Notes (Signed)
[facility]   PROGRESS NOTE     [facility]       Date Time: 10/03/19 2:34 PM Patient Name: Keith Gates, Keith Gates  Medical Record #:  [id number] Account#:  [id number] Admission Date:  09/30/2019    Primary Electrophysiologist: New EP Consult    Assessment:    History of CAD status post CABG   Cardiac arrest, with subsequent complete heart block in the setting of metabolic derangements   Ischemic cardiomyopathy with reduced ejection fraction of 20%    Recommendations:   Cause of arrest not clear.  However, as no pacing support required, heart block/bradyarrhythmias are not the likely cause.  Will remove TVP today.  No further reccs.  Eventual cath if neurologic recovery noted.     Medications:      Scheduled Meds:    insulin glargine, 10 Units, Subcutaneous, QHS  insulin lispro, 1-5 Units, Subcutaneous, Q4H SCH  iron sucrose, 300 mg, Intravenous, Q24H SCH  levETIRAcetam, 500 mg, Intravenous, Q12H SCH  piperacillin-tazobactam, 2.25 g, Intravenous, Q6H  sodium chloride, 1,000 mL, Intravenous, Once        Continuous Infusions:   norepinephrine (LEVOPHED) infusion 5 mcg/min (10/03/19 0519)    propofol 10 mcg/kg/min (10/03/19 1410)        Subjective:   Unable to assess    Physical Exam:     VITAL SIGNS PHYSICAL EXAM   Temp:  [98.8 F (37.1 C)] 98.8 F (37.1 C)  Heart Rate:  [70-91] 70  Resp Rate:  [0-21] 18  BP: (82-171)/(50-118) 116/67  FiO2:  [40 %-100 %] 40 %  Temp (24hrs), Avg:98.8 F (37.1 C), Min:98.8 F (37.1 C), Max:98.8 F (37.1 C)          Intake/Output Summary (Last 24 hours) at 10/03/2019 1434  Last data filed at 10/03/2019 1400  Gross per 24 hour   Intake 2752.8 ml   Output 1280 ml   Net 1472.8 ml    Physical Exam  General: awake, alert, breathing comfortable, no acute distress  Head: normocephalic  Eyes: EOM's intact  Cardiovascular: RRR nml s1 s2  Neck: no carotid bruits or JVD  Lungs: clear to auscultation bilaterally, without wheezing, rhonchi, or rales  Abdomen: soft,  non-tender, non-distended; no palpable masses,  normoactive bowel sounds  Extremities: no edema  Pulse: Bilateral radial pulses 2+   Neurological: Alert and oriented X3.  Psych: mood and affect normal  Musculoskeletal:  Good strength and tone  Skin: clean, dry, no masses or lesions.         Labs:           Recent Labs   Lab 10/03/19  0310   Magnesium 1.8     Recent Labs   Lab 10/02/19  1012  09/30/19  1411   PT 14.1  More results in Results Review 14.3   PT INR 1.1  More results in Results Review 1.1   PTT  --   --  29   More results in Results Review = values in this interval not displayed.     Recent Labs   Lab 10/03/19  1017 10/03/19  0310 10/02/19  1641   WBC 10.18* 12.34* 12.96*   Hgb 8.5* 9.0* 9.6*   Hematocrit 25.5* 26.7* 28.4*   Platelets 93* 103* 111*     Recent Labs   Lab 10/03/19  1017 10/03/19  0310 10/02/19  2319   Sodium 141 139 140   Potassium 4.3 4.4 4.4   Chloride 106 105  105   CO2 24 25 25    BUN 42.0* 43.0* 43.6*   Creatinine 1.8* 1.9* 2.0*   EGFR 38.5 36.1 34.1   Glucose 157* 164* 170*   Calcium 7.5* 7.8* 8.0*           Invalid input(s): FREET4  Recent Labs   Lab 10/03/19  0310   Bilirubin, Total 0.5   Protein, Total 5.1*   Albumin 2.5*   ALT 66*   AST (SGOT) 63*     Recent Labs   Lab 10/01/19  0903 10/01/19  0253 09/30/19  1412   Troponin I 0.93* 1.09* 0.03     .  Lab Results   Component Value Date    BNP 499.8 (H) 10/02/2019      Estimated Creatinine Clearance: 45.9 mL/min (A) (based on SCr of 1.8 mg/dL (H)).  Diagnostics:   Telemetry: NSR with no V pacing for two day      Signed by     Baldwin Crown, MD  Cardiac Electrophysiology  Heart Rhythm Center  [facility]    [facility] Electrophysiology APP Spectralink [phone]   [facility] Cardiology APP Spectralink [phone] (8am-5pm M-F)  [facility] Electrophysiology APP Spectralink [phone] (8am-4:30pm M-F)  After hours, non urgent consult line [phone]  After Hours, urgent consults [phone]  You can also  reach [place] 24/7 through our main EP office [phone].

## 2019-10-04 LAB — COMPREHENSIVE METABOLIC PANEL
ALT: 47 U/L (ref 0–55)
AST (SGOT): 43 U/L — ABNORMAL HIGH (ref 5–34)
Albumin/Globulin Ratio: 0.6 — ABNORMAL LOW (ref 0.9–2.2)
Alkaline Phosphatase: 110 U/L — ABNORMAL HIGH (ref 38–106)
Bilirubin, Total: 0.4 mg/dL (ref 0.2–1.2)
Globulin: 3.4 g/dL (ref 2.0–3.6)
Protein, Total: 5.5 g/dL — ABNORMAL LOW (ref 6.0–8.3)

## 2019-10-04 LAB — CBC AND DIFFERENTIAL
Absolute NRBC: 0 10*3/uL (ref 0.00–0.00)
Basophils Absolute Automated: 0.04 10*3/uL (ref 0.00–0.08)
Basophils Automated: 0.5 %
Eosinophils Absolute Automated: 0.24 10*3/uL (ref 0.00–0.44)
Eosinophils Automated: 2.8 %
Hematocrit: 23.7 % — ABNORMAL LOW (ref 37.6–49.6)
Hgb: 7.9 g/dL — ABNORMAL LOW (ref 12.5–17.1)
Immature Granulocytes Absolute: 0.03 10*3/uL (ref 0.00–0.07)
Immature Granulocytes: 0.3 %
Lymphocytes Absolute Automated: 1.14 10*3/uL (ref 0.42–3.22)
Lymphocytes Automated: 13.2 %
MCH: 28.9 pg (ref 25.1–33.5)
MCHC: 33.3 g/dL (ref 31.5–35.8)
MCV: 86.8 fL (ref 78.0–96.0)
MPV: 12.3 fL (ref 8.9–12.5)
Monocytes Absolute Automated: 0.58 10*3/uL (ref 0.21–0.85)
Monocytes: 6.7 %
Neutrophils Absolute: 6.61 10*3/uL — ABNORMAL HIGH (ref 1.10–6.33)
Neutrophils: 76.5 %
Nucleated RBC: 0 /100 WBC (ref 0.0–0.0)
Platelets: 86 10*3/uL — ABNORMAL LOW (ref 142–346)
RBC: 2.73 10*6/uL — ABNORMAL LOW (ref 4.20–5.90)
RDW: 14 % (ref 11–15)
WBC: 8.64 10*3/uL (ref 3.10–9.50)

## 2019-10-04 LAB — GLUCOSE WHOLE BLOOD - POCT
Whole Blood Glucose POCT: 196 mg/dL — ABNORMAL HIGH (ref 70–100)
Whole Blood Glucose POCT: 212 mg/dL — ABNORMAL HIGH (ref 70–100)
Whole Blood Glucose POCT: 215 mg/dL — ABNORMAL HIGH (ref 70–100)
Whole Blood Glucose POCT: 219 mg/dL — ABNORMAL HIGH (ref 70–100)
Whole Blood Glucose POCT: 252 mg/dL — ABNORMAL HIGH (ref 70–100)

## 2019-10-04 LAB — RENAL FUNCTION PANEL
Albumin: 2.1 g/dL — ABNORMAL LOW (ref 3.5–5.0)
Anion Gap: 11 (ref 5.0–15.0)
BUN: 37.6 mg/dL — ABNORMAL HIGH (ref 9.0–28.0)
CO2: 21 mEq/L — ABNORMAL LOW (ref 22–29)
Calcium: 7.9 mg/dL — ABNORMAL LOW (ref 8.5–10.5)
Chloride: 108 mEq/L (ref 100–111)
Creatinine: 1.8 mg/dL — ABNORMAL HIGH (ref 0.7–1.3)
Glucose: 180 mg/dL — ABNORMAL HIGH (ref 70–100)
Phosphorus: 1.7 mg/dL — ABNORMAL LOW (ref 2.3–4.7)
Potassium: 4 mEq/L (ref 3.5–5.1)
Sodium: 140 mEq/L (ref 136–145)

## 2019-10-04 LAB — GFR: EGFR: 38.5

## 2019-10-04 LAB — MAGNESIUM: Magnesium: 1.8 mg/dL (ref 1.6–2.6)

## 2019-10-04 LAB — HEMOLYSIS INDEX: Hemolysis Index: 6 (ref 0–18)

## 2019-10-04 LAB — TRIGLYCERIDES: Triglycerides: 84 mg/dL (ref 34–149)

## 2019-10-04 MED ORDER — SODIUM CHLORIDE 0.9 % IV BOLUS
500.00 mL | Freq: Once | INTRAVENOUS | Status: AC
Start: 2019-10-04 — End: 2019-10-04
  Administered 2019-10-04: 01:00:00 500 mL via INTRAVENOUS

## 2019-10-04 MED ORDER — SODIUM PHOSPHATES 3 MMOLE/ML IV SOLN (WRAP)
20.00 mmol | Freq: Once | INTRAVENOUS | Status: AC
Start: 2019-10-04 — End: 2019-10-04
  Administered 2019-10-04: 06:00:00 20 mmol via INTRAVENOUS
  Filled 2019-10-04: qty 6.67

## 2019-10-04 MED ORDER — MODAFINIL 200 MG PO TABS
200.0000 mg | ORAL_TABLET | Freq: Every day | ORAL | Status: DC
Start: 2019-10-04 — End: 2019-10-07
  Administered 2019-10-04 – 2019-10-06 (×3): 200 mg via OROGASTRIC
  Filled 2019-10-04 (×3): qty 1

## 2019-10-04 MED ORDER — MAGNESIUM SULFATE IN D5W 1-5 GM/100ML-% IV SOLN
1.00 g | Freq: Once | INTRAVENOUS | Status: AC
Start: 2019-10-04 — End: 2019-10-04
  Administered 2019-10-04: 22:00:00 1 g via INTRAVENOUS
  Filled 2019-10-04: qty 100

## 2019-10-04 NOTE — Progress Notes (Addendum)
[facility]   PROGRESS NOTE     [facility]       Date Time: 10/04/19 10:04 AM Patient Name: Keith Gates, Keith Gates  Medical Record #:  [id number] Account#:  [id number] Admission Date:  09/30/2019    Primary Electrophysiologist: New EP Consult    EP Attending Impressions:    Agree with assessment and plan as below.  There is still no clear cause of the arrest.  His transient complete heart block, was most likely as a result of the profound metabolic disturbances from his arrest.  He has not required pacing and the transvenous pacemaker has been removed.  He is currently in sinus rhythm with first-degree AV block.  In this setting would continue to avoid AV nodal blocking agents.  We are awaiting evidence of neurologic recovery and goals of care.  We will sign off at this time, but please call back with any questions.    Krystal Clark, MD  Cardiac Electrophysiology   Heart      Assessment:    History of CAD status post CABG   Cardiac arrest, with subsequent complete heart block in the setting of metabolic derangements   Ischemic cardiomyopathy with reduced ejection fraction of 20%    Recommendations:    Cause of arrest not clear.   However, as no pacing support required, heart block/bradyarrhythmias are not the likely cause. TVP removed 10/03/19   Eventual cath if neurologic recovery noted.    Will sign off at this time, please call with questions    Medications:      Scheduled Meds:    insulin glargine, 10 Units, Subcutaneous, QHS  insulin lispro, 1-5 Units, Subcutaneous, Q4H SCH  iron sucrose, 300 mg, Intravenous, Q24H SCH  levETIRAcetam, 500 mg, Intravenous, Q12H SCH  piperacillin-tazobactam, 2.25 g, Intravenous, Q6H  sodium chloride, 1,000 mL, Intravenous, Once  sodium phosphate IVPB, 20 mmol, Intravenous, Once        Continuous Infusions:   norepinephrine (LEVOPHED) infusion 5 mcg/min (10/04/19 1002)    propofol Stopped (10/04/19 0911)        Subjective:   Intubated and sedated     Physical Exam:     VITAL SIGNS PHYSICAL EXAM   Temp:  [99 F (37.2 C)] 99 F (37.2 C)  Heart Rate:  [65-89] 78  Resp Rate:  [5-21] 18  BP: (66-148)/(47-73) 126/55  FiO2:  [40 %] 40 %  Temp (24hrs), Avg:99 F (37.2 C), Min:99 F (37.2 C), Max:99 F (37.2 C)          Intake/Output Summary (Last 24 hours) at 10/04/2019 1004  Last data filed at 10/04/2019 0900  Gross per 24 hour   Intake 4241.46 ml   Output 1005 ml   Net 3236.46 ml    Physical Exam  General: intubated  Head: normocephalic  Cardiovascular: RRR  Neck: no carotid bruits or JVD  Lungs: mechanical chest rise equal  Abdomen: non-distended  Extremities: no edema  Neurological: unresponsive            Labs:           Recent Labs   Lab 10/04/19  0412   Magnesium 1.8     Recent Labs   Lab 10/02/19  1012  09/30/19  1411   PT 14.1  More results in Results Review 14.3   PT INR 1.1  More results in Results Review 1.1   PTT  --   --  29   More results in Results Review =  values in this interval not displayed.     Recent Labs   Lab 10/04/19  0412 10/03/19  1017 10/03/19  0310   WBC 8.64 10.18* 12.34*   Hgb 7.9* 8.5* 9.0*   Hematocrit 23.7* 25.5* 26.7*   Platelets 86* 93* 103*     Recent Labs   Lab 10/04/19  0412 10/03/19  1017 10/03/19  0310   Sodium 140 141 139   Potassium 4.0 4.3 4.4   Chloride 108 106 105   CO2 21* 24 25   BUN 37.6* 42.0* 43.0*   Creatinine 1.8* 1.8* 1.9*   EGFR 38.5 38.5 36.1   Glucose 180* 157* 164*   Calcium 7.9* 7.5* 7.8*           Invalid input(s): FREET4  Recent Labs   Lab 10/04/19  0412   Bilirubin, Total 0.4   Protein, Total 5.5*   Albumin 2.1*   ALT 47   AST (SGOT) 43*     Recent Labs   Lab 10/01/19  0903 10/01/19  0253 09/30/19  1412   Troponin I 0.93* 1.09* 0.03     .  Lab Results   Component Value Date    BNP 499.8 (H) 10/02/2019      Estimated Creatinine Clearance: 45.9 mL/min (A) (based on SCr of 1.8 mg/dL (H)).  Diagnostics:   Telemetry: SR 60-80 bpm    Signed by     Marcelino Scot, PA-C  Cardiac Electrophysiology  Heart  Rhythm Center  [facility]    [facility] Electrophysiology APP Spectralink [phone]   [facility] Cardiology APP Spectralink [phone] (8am-5pm M-F)  [facility] Electrophysiology APP Spectralink [phone] (8am-4:30pm M-F)  After hours, non urgent consult line [phone]  After Hours, urgent consults [phone]  You can also reach [place] 24/7 through our main EP office [phone].

## 2019-10-04 NOTE — Progress Notes (Signed)
[facility]- Critical Care Note     ICU Daily Progress Note        Date Time: 10/04/19 6:11 PM  Patient Name: Keith Gates  Attending Physician: Durward Fortes, MD  Room: IC03/IC03-A   Admit Date: 09/30/2019  LOS: 4 days            Assessment:     Patient Active Problem List   Diagnosis    Non-STEMI (non-ST elevated myocardial infarction)    DKA (diabetic ketoacidoses)    Heart block    Cardiac arrest    Iron deficiency anemia, unspecified iron deficiency anemia type     Status post cardiorespiratory arrest, completed therapeutic hypothermia protocol.  Septic shock.  Serratia pneumonia/aspiration pneumonia.  Patient rewarmed.  Sedation discontinued today.  Slight neurologic response with frowning slight withdrawal.  No purposeful response as of this time.  CT scan of the head showed no acute abnormality, chronic changes, small vessel disease.  No tremors or seizure activity seen clinically today.  EEG completed.  Diabetes on insulin with adequate control.  Hypophosphatemia repleted  Plan:   Sedation vacation.  Continue ventilatory treatment.  Neurologic assessment.  We will start Provigil.  Continue Zosyn for pneumonia.  Fluid and electrolyte management.  Enteral feedings.  Case discussed with patient's wife.      OTHER:  Glycemic Control. Glucose stabilizer per ICU protocol when on insulin drip. Maintain blood glucose 140-180.   Replace electrolytes per ICU electrolyte replacement protocol  Ventilator bundle & Sedation protocol followed. Daily morning sedation holiday, assessment for readiness for SBT & weaning from ventilator; and then re-titrate if required. Aim to keep peak plateau pressure 25-30cm H2O in ARDS patient. Cass tube to suction at 20-30 cm H2O, Maintain Cass tube with 5-52ml air every 4 hours. Chlorhexidine mouth washes and routine oral care every 4 hours. Stress ulcer and DVT prophylaxis. HOB >=30 degree elevation all the time.  HOB >=30 degree elevation all the time.  Aggressive pulmonary toileting. Incentive spirometry when appropriate. Aspiration precautions.     Quality Care: Stress ulcer prophylaxis, DVT prophylaxis, HOB elevated, Infection control all reviewed and addressed.  Events and notes from last 24 hours reviewed. Care plan discussed with nursing.       CC TIME: >50 min         Subjective:   Orally intubated.  No purposeful response to verbal stimuli.    Medications:       Scheduled Meds: PRN Meds:    insulin glargine, 10 Units, Subcutaneous, QHS  insulin lispro, 1-5 Units, Subcutaneous, Q4H SCH  iron sucrose, 300 mg, Intravenous, Q24H SCH  levETIRAcetam, 500 mg, Intravenous, Q12H SCH  piperacillin-tazobactam, 2.25 g, Intravenous, Q6H  sodium chloride, 1,000 mL, Intravenous, Once        Continuous Infusions:   norepinephrine (LEVOPHED) infusion 3.067 mcg/min (10/04/19 1800)    propofol Stopped (10/04/19 0911)    dextrose, 15 g of glucose, PRN    And  dextrose, 12.5 g, PRN    And  glucagon (rDNA), 1 mg, PRN              Physical Exam:     Vitals:    10/04/19 1500 10/04/19 1600 10/04/19 1700 10/04/19 1800   BP: 92/57 107/56 100/51 110/56   Pulse: 74 75 72 72   Resp: (!) 0 12 12 12    Temp:       TempSrc:  Esophageal     SpO2: 99% 100% 100% 100%   Weight:  Height:         Temp (24hrs), Avg:98.8 F (37.1 C), Min:98.6 F (37 C), Max:99 F (37.2 C)           12/17 0701 - 12/18 0700  In: 3967.38 [I.V.:2149.38]  Out: 1060 [Urine:1060]       General Appearance: Orally intubated.  Synchronous respirations.  Mental status: No purposeful response.  Neuro: Frowns to stimuli.  Slight withdrawal of extremities.  Not opening eyes or making eye contact.  H & N: No JVD ET tube orally placed.  Pupils 3 mm reactive  Lungs: Few crackles bilaterally  Cardiac: Regular normal sounds  Abdomen: Soft depressible bowel sounds present  Extremities: Trace edema  Skin: No rash      Data:       Vent Settings:    Vent Settings  Vent Mode: PRVC  FiO2: 40 %  Resp Rate (Set): 12  Vt (Set,  mL): 450 mL  PIP Observed (cm H2O): 21 cm H2O  PEEP/EPAP: 5 cm H20  Mean Airway Pressure: 8 cmH20      Labs:     Recent CBC   Recent Labs   Lab 10/04/19  0412 10/03/19  1017 10/03/19  0310   WBC 8.64 10.18* 12.34*   RBC 2.73* 2.95* 3.08*   Hgb 7.9* 8.5* 9.0*   Hematocrit 23.7* 25.5* 26.7*   MCV 86.8 86.4 86.7   Platelets 86* 93* 103*       Recent Labs   Lab 10/04/19  0412 10/03/19  1017 10/03/19  0310  10/02/19  1012 10/02/19  0422 10/01/19  2305  10/01/19  0903  10/01/19  0253  09/30/19  1412 09/30/19  1411   Sodium 140 141 139  More results in Results Review 143 143 142  More results in Results Review 137  --  139  More results in Results Review  --  141   Potassium 4.0 4.3 4.4  More results in Results Review 4.4 4.5 4.3  More results in Results Review 5.4*  --  6.9*  More results in Results Review  --  4.2   Chloride 108 106 105  More results in Results Review 104 105 106  More results in Results Review 106  --  109  More results in Results Review  --  114*   CO2 21* 24 25  More results in Results Review 26 25 24   More results in Results Review 18*  --  13*  More results in Results Review  --  <6*   Glucose 180* 157* 164*  More results in Results Review 143* 172* 170*  More results in Results Review 311*  --  349*  More results in Results Review  --  225*   BUN 37.6* 42.0* 43.0*  More results in Results Review 47.3* 52.8* 58.5*  More results in Results Review 71.0*  --  77.5*  More results in Results Review  --  78.3*   Creatinine 1.8* 1.8* 1.9*  More results in Results Review 2.2* 2.5* 2.6*  More results in Results Review 3.1*  --  3.5*  More results in Results Review  --  4.1*   Magnesium 1.8  --  1.8  --  1.9 1.6 2.0  More results in Results Review 1.7  --  1.9  More results in Results Review  --  2.6   Phosphorus 1.7* 2.2* 2.4  More results in Results Review 2.7 2.8 2.7  More results in Results Review 1.3*  --  2.8  More results in Results Review  --   --    AST (SGOT) 43*  --  63*  --  79* 77* 54*  --   --    --   --   More results in Results Review  --  53*   ALT 47  --  66*  --  85* 82* 62*  --   --   --   --   More results in Results Review  --  65*   Alkaline Phosphatase 110*  --  97  --  93 87 83  --   --   --   --   More results in Results Review  --  97   Bilirubin, Total 0.4  --  0.5  --  0.6 0.6 0.6  --   --   --   --   More results in Results Review  --  0.4   B-Natriuretic Peptide  --   --   --   --   --  499.8*  --   --   --   --   --   --   --   --    PT  --   --   --   --  14.1 14.7 15.3*  More results in Results Review 15.0  More results in Results Review  --   More results in Results Review  --  14.3   PT INR  --   --   --   --  1.1 1.2* 1.2*  More results in Results Review 1.2*  More results in Results Review  --   More results in Results Review  --  1.1   PTT  --   --   --   --   --   --   --   --   --   --   --   --   --  29   Troponin I  --   --   --   --   --   --   --   --  0.93*  --  1.09*  --  0.03  --    More results in Results Review = values in this interval not displayed.           Rads:     Radiology Results (24 Hour)     Procedure Component Value Units Date/Time    CT Head WO Contrast [[id number]] Collected: 10/03/19 1822    Order Status: Completed Updated: 10/03/19 1830    Narrative:      HISTORY: Altered mental status, cardiac arrest    TECHNIQUE: Axial CT images of the head were obtained, without  intravenous contrast.    The following dose reduction techniques were utilized: automated  exposure control and/or adjustment of the mA and/or kV according to  patient size, and the use of iterative reconstruction technique.    COMPARISON: 08/05/2018    FINDINGS: There is generalized cerebral volume loss. 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 There is no acute  intracranial hemorrhage or mass effect. Gray-white differentiation is  grossly maintained. The ventricles have not significantly changed in  size. The basilar cisterns remain patent. Atherosclerotic  vascular  calcifications are identified. There is no mass effect or midline shift.  There is no evidence of an acute intracranial hemorrhage or territorial  infarct. The osseous structures are unremarkable. There are inflammatory  changes  in the paranasal sinuses. There is also soft tissue density  involving the posterior aspects of the nasal cavity/nasopharynx,  partially imaged.      Impression:         1. No acute intracranial abnormality is identified.  2. Chronic ischemic changes.    Nicoletta Dress, MD   10/03/2019 6:28 PM          Radiological Imaging personally reviewed.    I have personally reviewed the patients history and 24 hour interval events, along with vitals, labs, radiology images and  ventilator settings and additional findings found in detail within ICU team notes, with their care plans developed with and reviewed by me.     Time spent in patient evaluation and treatment in critical care excluding procedures, and not overlapping any other providers: 60  minutes.    Signed by: Durward Fortes, MD  Date/Time: 10/04/19 6:11 PM

## 2019-10-04 NOTE — Progress Notes (Signed)
Infectious Disease            Progress Note    10/04/2019   Keith Gates [id number] is a 63 y.o. male, history of diabetes mellitus, hypertension, hyperlipidemia, stroke, coronary artery disease status post coronary artery bypass grafting who was admitted after cardiac arrest, CPR, with sepsis, UTI, possible pneumonia, aspiration.    Subjective:     Keith Gates today Symptoms: Afebrile, more thrombocytopenic, no significant change, intubated, sputum cultures growing Serratia. Other review of system is non contributory.    Objective:     Blood pressure 128/68, pulse 78, temperature 99 F (37.2 C), temperature source Core, resp. rate 18, height 1.803 m (5\' 11" ), weight 80.8 kg (178 lb 2.1 oz), SpO2 100 %.    General Appearance: Intubated  HEENT: Pallor positive, Anicteric sclera.   Neck: Supple  Lungs:Decreased breath sound at bases.   Chest Wall: Symmetric chest wall expansion.   Heart : S1 and S2.   Abdomen: Abdomen is soft, bowel sounds positive.  Neurological: Intubated    Laboratory And Diagnostic Studies:     Recent Labs     10/04/19  0412 10/03/19  1017   WBC 8.64 10.18*   Hgb 7.9* 8.5*   Hematocrit 23.7* 25.5*   Platelets 86* 93*   Neutrophils 76.5 76.0     Recent Labs     10/04/19  0412 10/03/19  1017   Sodium 140 141   Potassium 4.0 4.3   Chloride 108 106   CO2 21* 24   BUN 37.6* 42.0*   Creatinine 1.8* 1.8*   Glucose 180* 157*   Calcium 7.9* 7.5*     Recent Labs     10/04/19  0412 10/03/19  0310   AST (SGOT) 43* 63*   ALT 47 66*   Alkaline Phosphatase 110* 97   Protein, Total 5.5* 5.1*   Albumin 2.1* 2.5*   Bilirubin, Total 0.4 0.5       Current Med's:     Current Facility-Administered Medications   Medication Dose Route Frequency    insulin glargine  10 Units Subcutaneous QHS    insulin lispro  1-5 Units Subcutaneous Q4H SCH    iron sucrose  300 mg Intravenous Q24H SCH    levETIRAcetam  500 mg Intravenous Q12H SCH    piperacillin-tazobactam  2.25 g Intravenous Q6H     sodium chloride  1,000 mL Intravenous Once       Lines/Drains:     Patient Lines/Drains/Airways Status    Active Lines, Drains and Airways     Name:   Placement date:   Placement time:   Site:   Days:    CVC Triple Lumen 09/30/19 Left Internal jugular   09/30/19    2240    Internal jugular   3    Peripheral IV 09/30/19 18 G Anterior;Distal;Left Upper Arm   09/30/19    1342    Upper Arm   3    Peripheral IV 09/30/19 20 G Distal;Posterior;Right Forearm   09/30/19    1444    Forearm   3    Urethral Catheter Double-lumen 16 Fr.   09/30/19    1413    Double-lumen   3    ETT  7 mm   09/30/19    1402     3    NG/OG Tube Orogastric 18 Fr. 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 Trina Ao, M.D.,FACP  10/04/2019  1:38 PM          *This note was generated by the Epic EMR system/ Dragon speech recognition and may contain inherent errors or omissions not intended by the user. Grammatical errors, random word insertions, deletions, pronoun errors and incomplete sentences are occasional consequences of this technology due to software limitations. Not all errors are caught or corrected. If there are questions or concerns about the content of this note or information contained within the body of this dictation they should be addressed directly with the author  for clarification

## 2019-10-04 NOTE — Progress Notes (Signed)
STAT EEG performed as ordered. Report will follow.

## 2019-10-05 LAB — TYPE AND SCREEN
AB Screen Gel: NEGATIVE
ABO Rh: A POS

## 2019-10-05 LAB — CBC AND DIFFERENTIAL
Absolute NRBC: 0 10*3/uL (ref 0.00–0.00)
Basophils Absolute Automated: 0.02 10*3/uL (ref 0.00–0.08)
Basophils Automated: 0.3 %
Eosinophils Absolute Automated: 0.29 10*3/uL (ref 0.00–0.44)
Eosinophils Automated: 4.4 %
Hematocrit: 22.6 % — ABNORMAL LOW (ref 37.6–49.6)
Hgb: 7.4 g/dL — ABNORMAL LOW (ref 12.5–17.1)
Immature Granulocytes Absolute: 0.03 10*3/uL (ref 0.00–0.07)
Immature Granulocytes: 0.5 %
Lymphocytes Absolute Automated: 0.8 10*3/uL (ref 0.42–3.22)
Lymphocytes Automated: 12.3 %
MCH: 28.7 pg (ref 25.1–33.5)
MCHC: 32.7 g/dL (ref 31.5–35.8)
MCV: 87.6 fL (ref 78.0–96.0)
MPV: 12.3 fL (ref 8.9–12.5)
Monocytes Absolute Automated: 0.57 10*3/uL (ref 0.21–0.85)
Monocytes: 8.7 %
Neutrophils Absolute: 4.82 10*3/uL (ref 1.10–6.33)
Neutrophils: 73.8 %
Nucleated RBC: 0 /100 WBC (ref 0.0–0.0)
Platelets: 85 10*3/uL — ABNORMAL LOW (ref 142–346)
RBC: 2.58 10*6/uL — ABNORMAL LOW (ref 4.20–5.90)
RDW: 14 % (ref 11–15)
WBC: 6.53 10*3/uL (ref 3.10–9.50)

## 2019-10-05 LAB — RENAL FUNCTION PANEL
Albumin: 2 g/dL — ABNORMAL LOW (ref 3.5–5.0)
Anion Gap: 9 (ref 5.0–15.0)
BUN: 38.7 mg/dL — ABNORMAL HIGH (ref 9.0–28.0)
CO2: 21 mEq/L — ABNORMAL LOW (ref 22–29)
Calcium: 7.9 mg/dL — ABNORMAL LOW (ref 8.5–10.5)
Chloride: 108 mEq/L (ref 100–111)
Creatinine: 1.8 mg/dL — ABNORMAL HIGH (ref 0.7–1.3)
Glucose: 239 mg/dL — ABNORMAL HIGH (ref 70–100)
Phosphorus: 2.9 mg/dL (ref 2.3–4.7)
Potassium: 4 mEq/L (ref 3.5–5.1)
Sodium: 138 mEq/L (ref 136–145)

## 2019-10-05 LAB — COMPREHENSIVE METABOLIC PANEL
ALT: 38 U/L (ref 0–55)
AST (SGOT): 33 U/L (ref 5–34)
Albumin/Globulin Ratio: 0.6 — ABNORMAL LOW (ref 0.9–2.2)
Alkaline Phosphatase: 138 U/L — ABNORMAL HIGH (ref 38–106)
Bilirubin, Total: 0.3 mg/dL (ref 0.2–1.2)
Globulin: 3.6 g/dL (ref 2.0–3.6)
Protein, Total: 5.6 g/dL — ABNORMAL LOW (ref 6.0–8.3)

## 2019-10-05 LAB — PREPARE RBC
Expiration Date: 202101022359
ISBT CODE: 6200
Status: TRANSFUSED
UTYPE: A POS

## 2019-10-05 LAB — GLUCOSE WHOLE BLOOD - POCT
Whole Blood Glucose POCT: 167 mg/dL — ABNORMAL HIGH (ref 70–100)
Whole Blood Glucose POCT: 180 mg/dL — ABNORMAL HIGH (ref 70–100)
Whole Blood Glucose POCT: 194 mg/dL — ABNORMAL HIGH (ref 70–100)
Whole Blood Glucose POCT: 257 mg/dL — ABNORMAL HIGH (ref 70–100)
Whole Blood Glucose POCT: 259 mg/dL — ABNORMAL HIGH (ref 70–100)
Whole Blood Glucose POCT: 271 mg/dL — ABNORMAL HIGH (ref 70–100)

## 2019-10-05 LAB — MAGNESIUM: Magnesium: 2.1 mg/dL (ref 1.6–2.6)

## 2019-10-05 LAB — GFR: EGFR: 38.5

## 2019-10-05 MED ORDER — FUROSEMIDE 10 MG/ML IJ SOLN
40.00 mg | Freq: Once | INTRAMUSCULAR | Status: AC
Start: 2019-10-05 — End: 2019-10-05
  Administered 2019-10-05: 12:00:00 40 mg via INTRAVENOUS
  Filled 2019-10-05: qty 4

## 2019-10-05 MED ORDER — CARBOXYMETHYLCELLULOSE SODIUM 0.5 % OP SOLN
1.00 [drp] | Freq: Three times a day (TID) | OPHTHALMIC | Status: DC
Start: 2019-10-05 — End: 2019-10-08
  Administered 2019-10-05 – 2019-10-08 (×9): 1 [drp] via OPHTHALMIC
  Filled 2019-10-05: qty 1

## 2019-10-05 MED ORDER — DOCUSATE SODIUM 50 MG/5ML PO LIQD
100.00 mg | Freq: Every day | ORAL | Status: DC
Start: 2019-10-05 — End: 2019-10-10
  Administered 2019-10-05 – 2019-10-10 (×4): 100 mg via ORAL
  Filled 2019-10-05 (×5): qty 10

## 2019-10-05 MED ORDER — FUROSEMIDE 10 MG/ML IJ SOLN
20.00 mg | Freq: Once | INTRAMUSCULAR | Status: DC
Start: 2019-10-05 — End: 2019-10-05

## 2019-10-05 MED ORDER — CHLORHEXIDINE GLUCONATE 0.12 % MT SOLN
15.00 mL | Freq: Two times a day (BID) | OROMUCOSAL | Status: DC
Start: 2019-10-05 — End: 2019-10-08
  Administered 2019-10-05 – 2019-10-08 (×5): 15 mL via OROMUCOSAL
  Filled 2019-10-05 (×3): qty 15

## 2019-10-05 MED ORDER — MIDODRINE HCL 5 MG PO TABS
10.0000 mg | ORAL_TABLET | Freq: Three times a day (TID) | ORAL | Status: DC
Start: 2019-10-05 — End: 2019-10-07
  Administered 2019-10-05: 20:00:00 10 mg via NASOGASTRIC
  Filled 2019-10-05 (×2): qty 2

## 2019-10-05 MED ORDER — INSULIN GLARGINE 100 UNIT/ML SC SOLN
15.00 [IU] | Freq: Every day | SUBCUTANEOUS | Status: DC
Start: 2019-10-05 — End: 2019-10-06
  Administered 2019-10-05 – 2019-10-06 (×2): 15 [IU] via SUBCUTANEOUS
  Filled 2019-10-05 (×2): qty 15

## 2019-10-05 MED ORDER — MIDODRINE HCL 5 MG PO TABS
5.0000 mg | ORAL_TABLET | Freq: Three times a day (TID) | ORAL | Status: DC
Start: 2019-10-05 — End: 2019-10-05
  Administered 2019-10-05: 12:00:00 5 mg via NASOGASTRIC
  Filled 2019-10-05: qty 1

## 2019-10-05 MED ORDER — SODIUM CHLORIDE 0.9 % IV SOLN
INTRAVENOUS | Status: DC | PRN
Start: 2019-10-05 — End: 2019-10-10

## 2019-10-05 MED ORDER — INSULIN LISPRO 100 UNIT/ML SC SOLN
2.00 [IU] | SUBCUTANEOUS | Status: DC
Start: 2019-10-05 — End: 2019-10-07
  Administered 2019-10-05: 21:00:00 2 [IU] via SUBCUTANEOUS
  Administered 2019-10-05: 12:00:00 6 [IU] via SUBCUTANEOUS
  Administered 2019-10-06 (×2): 4 [IU] via SUBCUTANEOUS
  Filled 2019-10-05: qty 18
  Filled 2019-10-05 (×2): qty 12
  Filled 2019-10-05: qty 6

## 2019-10-05 NOTE — Progress Notes (Signed)
EEG reveals slowing... indicative of generalized neurophysiologic disturbance.. however no epileptiform discharges were seen.    Thanks  Caleen Essex MD  Neurology

## 2019-10-05 NOTE — Progress Notes (Signed)
[facility]- Critical Care Note     ICU Daily Progress Note        Date Time: 10/05/19 11:16 AM  Patient Name: Keith Gates  Attending Physician: Durward Fortes, MD  Room: IC03/IC03-A   Admit Date: 09/30/2019  LOS: 5 days      Assessment/Plan:     #Neuro: h/o prior stroke, anoxic encephalopathy, will finish [facility] at noon.   head ct negative, EEG completed await report.  Appreciate Neurology Dr. Dorene Grebe evaluation, and recommendation.     #pulm: Respiratory failure secondary to cardiac arrest. +Serratia Marcescens pneumonia,  Continue mechanical ventilation.  Plan for breathing trial today.     #cardiac: Cardiac arrest with complete heart block in the setting of sepsis and septic shock.   h/o NSTEMI 01/2018, with CABG 01/2019. Ischemic cardiomyopathy, EF 20-25%. HLD, complete heart block noted on admission.   Appreciate [facility] evaluation recommendation. Plan for removal of temporary pacing wire.    #nutrition: npo, appreciate nutritionist recommendation. Continue tube feeds    # renal: acute renal failure, appreciate [facility], Dr. Kittie Plater,  nephrology evaluation recommendation. Improving renal dysfunction  Fluid overload, order for lasix    #Heme: thrombocytopenia from sepsis,     #endocrine: diabetes mellitus, Hemoglobin A1c:6.4%,  resolved DKA, uncontrolled,  add lantus and SSI.    #ID: + urinary tract infection, + urine analysis. + Serratia marcescens pneumonia. Sepsis, septic shock. Continue with antibiotics  COVID negative      #Prophylaxis:   GI Prophylaxis:  +  VTE Prophylaxis:+      #Code Status: full code     Addendum:  2:42 pm  Called pt's wife using a [place] telephone translator discussed improvement in neurologic status.   Also informed her of anemia, recommendation for blood transfusion. Went over risk/benefit of blood transfusion. Pt's wife consented for it and witnessed by nurse Sam.  Answered her questions.     Subjective:   63 year old male with  history of diabetes, hypertension, prior stroke, coronary artery disease with coronary artery bypass graft 4/202 presents with cardiac arrest from complete heart block in the setting of sepsis and septic shock.    Intubated, off sedation, pt can open eyes and track, also squeeze hands on command    Hospital course  12/14 admitted to ICU. [facility] protocol, lactic elevated-->started bicarb drip    heart rhythm changed from V-paced in the 70s to RBBB HR 100-111, EKG done and responded to decrease in dobutamine drip. started insulin drip.  12/15 Dobutamine off  12/17 added keppra and propofol for seizure like activity. TF @ goal   12/18- propofol off @0911  no change in neuro status   12/19: following commands    Medications:   Scheduled Meds:  Current Facility-Administered Medications   Medication Dose Route Frequency    insulin glargine  10 Units Subcutaneous QHS    insulin lispro  1-5 Units Subcutaneous Q4H SCH    levETIRAcetam  500 mg Intravenous Q12H SCH    modafinil  200 mg per OG tube Daily    piperacillin-tazobactam  2.25 g Intravenous Q6H    sodium chloride  1,000 mL Intravenous Once         Continuous Infusions:   norepinephrine (LEVOPHED) infusion 1 mcg/min (10/05/19 1114)    propofol Stopped (10/04/19 0911)        Physical Exam:     Vitals:    10/05/19 1100   BP: 118/57   Pulse: 83   Resp:  12   Temp:    SpO2: 99%         Intake/Output Summary (Last 24 hours) at 10/05/2019 1116  Last data filed at 10/05/2019 1100  Gross per 24 hour   Intake 2537.8 ml   Output 1137 ml   Net 1400.8 ml       General Appearance: Intubated, older than stated age , awake, tracking, diffusely weak  CV: Regular rate, no m/r/g  Pulm: crackles  Abd: Nondistended  Extremities: + edema in upper arms > legs    Data:     Vent Settings:    Vent Settings  Vent Mode: PRVC  FiO2: 30 %  Resp Rate (Set): 12  Vt (Set, mL): 450 mL  PIP Observed (cm H2O): 22 cm H2O  PEEP/EPAP: 5 cm H20  Mean Airway Pressure: 8 cmH20    Labs:       Labs  (last 72 hours):  Recent Labs     10/05/19  0338 10/04/19  0412 10/03/19  1017   WBC 6.53 8.64 10.18*   Hgb 7.4* 7.9* 8.5*   Hematocrit 22.6* 23.7* 25.5*     No results for input(s): PT, INR, PTT in the last 72 hours. Recent Labs     10/05/19  0338 10/04/19  0412 10/03/19  1017 10/03/19  0310   Sodium 138 140 141 139   Potassium 4.0 4.0 4.3 4.4   Chloride 108 108 106 105   CO2 21* 21* 24 25   BUN 38.7* 37.6* 42.0* 43.0*   Creatinine 1.8* 1.8* 1.8* 1.9*   Glucose 239* 180* 157* 164*   Calcium 7.9* 7.9* 7.5* 7.8*   Magnesium 2.1 1.8  --  1.8   Phosphorus 2.9 1.7* 2.2* 2.4               Rads:          Renal ultrasound    10/02/2019    "No hydronephrosis. "    Radiological Imaging personally reviewed,and agree with radiology report including:     Head cT without contrast    10/03/2019   "1. No acute intracranial abnormality is identified.   2. Chronic ischemic changes"     CXR    09/30/2019  "Lines in good position. Hypoventilation with left basilar atelectasis.   No significant consolidation or pneumothorax. "    I have personally reviewed the patients history and 24 hour interval events, along with vitals, labs, radiology images and nursing. So far today I have spent _45_ minutes providing care for this patient excluding teaching and billable procedures, and not overlapping with any other providers.    Signed by: Lawernce Ion, MD  Date/Time: 10/05/19 11:16 AM    *This note was generated by the Epic EMR system/ Dragon speech recognition and may contain inherent errors or omissions not intended by the user. Grammatical errors, random word insertions, deletions, pronoun errors and incomplete sentences are occasional consequences of this technology due to software limitations. Not all errors are caught or corrected. If there are questions or concerns about the content of this note or information contained within the body of this dictation they should be addressed directly with the author for clarification

## 2019-10-05 NOTE — Plan of Care (Signed)
Reviewed chart. Awaiting neurologic recovery prior to scheduling further cardiac evaluation/testing. Please notify us with change in clinical status or any questions.

## 2019-10-05 NOTE — Progress Notes (Signed)
Subjective:  Interval History: Neuro status much improved.. following commands quite well. Sqeezes my finger to command  Moves and wiggles toes to command  Good eye tracking  EEG reveals generalized slowing but no epileptiform discharges seen  Continue supportive care      Objective:  Last 24 Hour Vital Signs:  Temp:  [98.1 F (36.7 C)-100 F (37.8 C)] 99.7 F (37.6 C)  Heart Rate:  [64-89] 82  Resp Rate:  [0-25] 24  BP: (86-140)/(50-65) 118/59  FiO2:  [30 %-40 %] 30 %    Physical Exam:      Good eye tracking  Awake and follows commands  Head atraumatic Normocephalic  Eyes extraocular muscles intact  Motor squeezes finger and wiggles toes to command  Sensory intact      Scheduled Meds:  Current Facility-Administered Medications   Medication Dose Route Frequency    carboxymethylcellulose (PF)  1 drop Both Eyes Q8H    chlorhexidine  15 mL Mouth/Throat Q12H SCH    docusate  100 mg Oral Daily    insulin glargine  10 Units Subcutaneous QHS    insulin glargine  15 Units Subcutaneous Daily    insulin lispro  2-10 Units Subcutaneous Q4H SCH    levETIRAcetam  500 mg Intravenous Q12H SCH    midodrine  5 mg per NG tube Q8H    modafinil  200 mg per OG tube Daily    piperacillin-tazobactam  2.25 g Intravenous Q6H    sodium chloride  1,000 mL Intravenous Once       Continuous Infusions:   norepinephrine (LEVOPHED) infusion Stopped (10/05/19 1302)       PRN Meds:  sodium chloride, Nursing communication: Adult Hypoglycemia Treatment Algorithm **AND** dextrose **AND** dextrose **AND** glucagon (rDNA)    Last 24 Hour Labs:  Results     Procedure Component Value Units Date/Time    Prepare/Crossmatch Red Blood Cells [[id number]] Collected: 10/05/19 1511     Updated: 10/05/19 1627     RBC Leukoreduced RBC Leukoreduced     BLUNIT [id number]     Status issued     PRODUCT CODE (NON READABLE) E0336V00     Expiration Date [id number]     UTYPE A POS     ISBT CODE 6200    Type and Screen [[id number]] Collected: 10/05/19  1511    Specimen: Blood Updated: 10/05/19 1612     ABO Rh A POS     AB Screen Gel NEG    Glucose Whole Blood - POCT [[id number]]  (Abnormal) Collected: 10/05/19 1557     Updated: 10/05/19 1604     Whole Blood Glucose POCT 180 mg/dL     Glucose Whole Blood - POCT [[id number]]  (Abnormal) Collected: 10/05/19 1141     Updated: 10/05/19 1234     Whole Blood Glucose POCT 271 mg/dL     CULTURE BLOOD AEROBIC AND ANAEROBIC [[id number]] Collected: 10/01/19 0253    Specimen: Blood, Venipuncture Updated: 10/05/19 1221    Narrative:      The order will result in two separate 8-51ml bottles  Please do NOT order repeat blood cultures if one has been  drawn within the last 48 hours  UNLESS concerned for  endocarditis  AVOID BLOOD CULTURE DRAWS FROM CENTRAL LINE IF POSSIBLE  Indications:->Other  Other->Admission  ORDER#: [id number]  ORDERED BY: MENDIGUREN, IGN  SOURCE: Blood, Venipuncture left wrist               COLLECTED:  10/01/19 02:53  ANTIBIOTICS AT COLL.:                                RECEIVED :  10/01/19 12:12  Culture Blood Aerobic and Anaerobic        PRELIM      10/05/19 12:21  10/02/19   No Growth after 1 day/s of incubation.  10/03/19   No Growth after 2 day/s of incubation.  10/04/19   No Growth after 3 day/s of incubation.  10/05/19   No Growth after 4 day/s of incubation.      CULTURE BLOOD AEROBIC AND ANAEROBIC [[id number]] Collected: 10/01/19 0254    Specimen: Blood, Venipuncture Updated: 10/05/19 1221    Narrative:      The order will result in two separate 8-37ml bottles  Please do NOT order repeat blood cultures if one has been  drawn within the last 48 hours  UNLESS concerned for  endocarditis  AVOID BLOOD CULTURE DRAWS FROM CENTRAL LINE IF POSSIBLE  Indications:->Other  Other->Admission  ORDER#: [id number]                                    ORDERED BY: MENDIGUREN, IGN  SOURCE: Blood, Venipuncture left hand                COLLECTED:  10/01/19 02:54  ANTIBIOTICS AT COLL.:                                 RECEIVED :  10/01/19 11:56  Culture Blood Aerobic and Anaerobic        PRELIM      10/05/19 12:21  10/02/19   No Growth after 1 day/s of incubation.  10/03/19   No Growth after 2 day/s of incubation.  10/04/19   No Growth after 3 day/s of incubation.  10/05/19   No Growth after 4 day/s of incubation.      Glucose Whole Blood - POCT [[id number]]  (Abnormal) Collected: 10/05/19 0728     Updated: 10/05/19 0812     Whole Blood Glucose POCT 259 mg/dL     GFR [[id number]] Collected: 10/05/19 0338     Updated: 10/05/19 0413     EGFR 38.5    Magnesium [[id number]] Collected: 10/05/19 0338    Specimen: Blood Updated: 10/05/19 0413     Magnesium 2.1 mg/dL     Renal function panel [[id number]]  (Abnormal) Collected: 10/05/19 0338    Specimen: Blood Updated: 10/05/19 0413     Glucose 239 mg/dL      Sodium 875 mEq/L      Potassium 4.0 mEq/L      Chloride 108 mEq/L      CO2 21 mEq/L      BUN 38.7 mg/dL      Calcium 7.9 mg/dL      Creatinine 1.8 mg/dL      Albumin 2.0 g/dL      Phosphorus 2.9 mg/dL      Anion Gap 9.0    Comprehensive metabolic panel [[id number]]  (Abnormal) Collected: 10/05/19 0338    Specimen: Blood Updated: 10/05/19 0413     Protein, Total 5.6 g/dL  AST (SGOT) 33 U/L      ALT 38 U/L      Alkaline Phosphatase 138 U/L      Bilirubin, Total 0.3 mg/dL      Globulin 3.6 g/dL      Albumin/Globulin Ratio 0.6    CBC and differential [[id number]]  (Abnormal) Collected: 10/05/19 0338    Specimen: Blood Updated: 10/05/19 0404     WBC 6.53 x10 3/uL      Hgb 7.4 g/dL      Hematocrit 40.9 %      Platelets 85 x10 3/uL      RBC 2.58 x10 6/uL      MCV 87.6 fL      MCH 28.7 pg      MCHC 32.7 g/dL      RDW 14 %      MPV 12.3 fL      Neutrophils 73.8 %      Lymphocytes Automated 12.3 %      Monocytes 8.7 %      Eosinophils Automated 4.4 %      Basophils Automated 0.3 %      Immature Granulocytes 0.5 %      Nucleated RBC 0.0 /100 WBC      Neutrophils Absolute 4.82 x10 3/uL      Lymphocytes Absolute Automated 0.80  x10 3/uL      Monocytes Absolute Automated 0.57 x10 3/uL      Eosinophils Absolute Automated 0.29 x10 3/uL      Basophils Absolute Automated 0.02 x10 3/uL      Immature Granulocytes Absolute 0.03 x10 3/uL      Absolute NRBC 0.00 x10 3/uL     Glucose Whole Blood - POCT [[id number]]  (Abnormal) Collected: 10/05/19 0024     Updated: 10/05/19 0046     Whole Blood Glucose POCT 257 mg/dL     Glucose Whole Blood - POCT [[id number]]  (Abnormal) Collected: 10/04/19 1943     Updated: 10/04/19 1946     Whole Blood Glucose POCT 212 mg/dL           Last 24 Hour Radiology:  Radiology Results (24 Hour)     ** No results found for the last 24 hours. **          Assessment:    Principal Problem:  Heart block    Active Problems:   Patient Active Problem List   Diagnosis    Non-STEMI (non-ST elevated myocardial infarction)    DKA (diabetic ketoacidoses)    Heart block    Cardiac arrest    Iron deficiency anemia, unspecified iron deficiency anemia type       Plan:    Neuro status much improved.. following commands quite well. Sqeezes my finger to command  Moves and wiggles toes to command  Good eye tracking  EEG reveals generalized slowing but no epileptiform discharges seen  Continue supportive care    Keith Hanover Maye Hides, MD  Neurology

## 2019-10-05 NOTE — Procedures (Signed)
Service Date: 10/04/2019     Patient Type: I     PHYSICIAN/PROVIDER: Rob Mciver Leontine Locket MD     REFERRING PHYSICIAN:      HISTORY:  A 63 year old gentleman with history of anoxic encephalopathy, who has just  recently finished with [facility] treatment.  The patient has decreased  responsiveness.  He is currently having some eye tracking and now following  a few simple 1-step commands.     DESCRIPTION:  This is a digital EEG.  The 10-20 international system of electrode  placement was used.  The background rhythm in this EEG record consists of  well-organized and poorly developed waves of 6 to 6-1/2 per second, which  are fairly symmetrical on both sides.  Photic stimulation produced poor  driving.  The patient is currently intubated.  No lateralizing  abnormalities were detected.  No epileptiform discharges were seen.     IMPRESSION:  Abnormal electroencephalogram suggestive of a generalized neurophysiologic  disturbance.  No epileptiform discharges were seen.  Please correlate  clinically.           D:  10/05/2019 15:46 PM by Dr. Bjorn Pippin. Dorene Grebe, MD ([id number])  T:  10/05/2019 17:29 PM by NTS      Everlean Cherry: [id number]) (Doc ID: [id number])

## 2019-10-05 NOTE — Progress Notes (Signed)
10/05/19 0500   Provider Notification   Reason for Communication Review case   Critical Lab CBC   Critical Lab Value Hgb 7.4   Provider Name Dr [name]   Provider Role Intensivist   Method of Communication Face to face   Readback Results Yes   Response No new orders

## 2019-10-05 NOTE — Progress Notes (Signed)
Infectious Disease            Progress Note    10/05/2019   Keith Gates VWU:[id number],FAO:[id number] is a 63 y.o. male, history of diabetes mellitus, hypertension, hyperlipidemia, stroke, coronary artery disease status post coronary artery bypass grafting who was admitted after cardiac arrest, CPR, with sepsis, UTI, possible pneumonia, aspiration.    Subjective:     Keith Gates today Symptoms: Low-grade fever, more responsive, intubated. Other review of system is non contributory.    Objective:     Blood pressure 136/64, pulse 89, temperature 100 F (37.8 C), temperature source Temporal, resp. rate 14, height 1.803 m (5\' 11" ), weight 83.5 kg (184 lb 1.4 oz), SpO2 99 %.    General Appearance: Intubated, responsive  HEENT: Pallor positive, Anicteric sclera.   Neck: Supple  Lungs:Decreased breath sound at bases.   Chest Wall: Symmetric chest wall expansion.   Heart : S1 and S2.   Abdomen: Abdomen is soft, bowel sounds positive.  Neurological: Intubated, responsive    Laboratory And Diagnostic Studies:     Recent Labs     10/05/19  0338 10/04/19  0412   WBC 6.53 8.64   Hgb 7.4* 7.9*   Hematocrit 22.6* 23.7*   Platelets 85* 86*   Neutrophils 73.8 76.5     Recent Labs     10/05/19  0338 10/04/19  0412   Sodium 138 140   Potassium 4.0 4.0   Chloride 108 108   CO2 21* 21*   BUN 38.7* 37.6*   Creatinine 1.8* 1.8*   Glucose 239* 180*   Calcium 7.9* 7.9*     Recent Labs     10/05/19  0338 10/04/19  0412   AST (SGOT) 33 43*   ALT 38 47   Alkaline Phosphatase 138* 110*   Protein, Total 5.6* 5.5*   Albumin 2.0* 2.1*   Bilirubin, Total 0.3 0.4       Current Med's:     Current Facility-Administered Medications   Medication Dose Route Frequency    insulin glargine  10 Units Subcutaneous QHS    insulin lispro  1-5 Units Subcutaneous Q4H SCH    iron sucrose  300 mg Intravenous Q24H SCH    levETIRAcetam  500 mg Intravenous Q12H SCH    modafinil  200 mg per OG tube Daily    piperacillin-tazobactam  2.25 g Intravenous  Q6H    sodium chloride  1,000 mL Intravenous Once       Lines/Drains:     Patient Lines/Drains/Airways Status    Active Lines, Drains and Airways     Name:   Placement date:   Placement time:   Site:   Days:    CVC Triple Lumen 09/30/19 Left Internal jugular   09/30/19    2240    Internal jugular   4    Peripheral IV 09/30/19 18 G Anterior;Distal;Left Upper Arm   09/30/19    1342    Upper Arm   4    Peripheral IV 09/30/19 20 G Distal;Posterior;Right Forearm   09/30/19    1444    Forearm   4    Urethral Catheter Double-lumen 16 Fr.   09/30/19    1413    Double-lumen   4    ETT  7 mm   09/30/19    1402     4    NG/OG Tube Orogastric 18 Fr. 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 Roxan Hockey, M.D.,FACP  10/05/2019  9:56 AM          *This note was generated by the Epic EMR system/ Dragon speech recognition and may contain inherent errors or omissions not intended by the user. Grammatical errors, random word insertions, deletions, pronoun errors and incomplete sentences are occasional consequences of this technology due to software limitations. Not all errors are caught or corrected. If there are questions or concerns about the content of this note or information contained within the body of this dictation they should be addressed directly with the author for clarification

## 2019-10-05 NOTE — Progress Notes (Signed)
[facility]  NEPHROLOGY PROGRESS NOTE    Date Time: 10/05/19 5:31 PM  Patient Name: Keith Gates, Keith Gates  MRN#: [id number]  DOB: 06/21/1956  Requesting Physician: Durward Fortes, MD      Reason for Consultation:   ARF on CKD II    Assessment:   ARF  CKD II  Hyperkalemia  Acidosis  HypoPO4  Anemia  HTN    Plan:   - Renal: pt with ARF on CKD II. His CKD with proteinuria is due to his underlying DM and HTN. His ARF is due to ATN by his cardiac arrest causing hemodynamic instability. His renal function is stable with an acceptable UOP. I will follow his renal function closely with you. Please avoid nephrotoxins like ACEI/ARB, NSAIDs and contrast as much as possible.    - Hyperkalemia: severe, due to ARF but improving with conservative management. I will follow his levels closely     - Acidosis: due to ARF and lactic acidosis. Improved. Follow levels.    - HypoPO4: he was hyperPO4 on admission which is now resolved. I will follow his levels    - Anemia: I will give him iv venofer for severe iron deficiency.     - HTN: BP is low. I will increase his midodrine to 10mg  po TID. Will continue the current management and try to maintain MAP>65.       History:   Keith Gates is a 63 y.o. male who presents to the hospital on 09/30/2019 after cardiac arrest requiring 6 minutes of resuscitation on site by EMS. The pt has a h/o controlled DM, controlled HTN and CKD stage II with a baseline sCr in the low 1's and proteinuria. He has also a h/o CAD s/p CABG earlier 2020.   He was apparently c/o generalized weakness and SOB for 1-2 days and then was found unresponsive and bradycardic by his wife on the bed. EMS was called who found him to be bradycardic and he went into asystole. CPR was done for 6 minutes. He regained pulse and was intubated and admitted to the ICU. On admission his sCr was 4.1 and his K peaked at 7.3. His glucose was 392. He was treated medically with with pressors for low BP and dobutamine drip  and also iv bicarb for his acidosis and his sCr is now better at 3.1 and his K was 5.4. His CXR shows clear lungs.     When I saw the pt he was intubated and sedated, unable to give any history or ROS.       Past Medical History:     Past Medical History:   Diagnosis Date    Diabetes mellitus     Elevated cholesterol     Hypertension     Stroke        Past Surgical History:     Past Surgical History:   Procedure Laterality Date    CORONARY ARTERY BYPASS GRAFT  01/2018    TEMPORARY PACER INSERTION N/A 09/30/2019    Procedure: TEMPORARY PACER INSERTION;  Surgeon: Acie Fredrickson, MD;  Location: [facility];  Service: Cardiovascular;  Laterality: N/A;       Family History:   History reviewed. No pertinent family history.    Social History:     Social History     Socioeconomic History    Marital status: Married     Spouse name: Not on file    Number of children: Not on file    Years  of education: Not on file    Highest education level: Not on file   Occupational History    Not on file   Social Needs    Financial resource strain: Not on file    Food insecurity     Worry: Not on file     Inability: Not on file    Transportation needs     Medical: Not on file     Non-medical: Not on file   Tobacco Use    Smoking status: Former Smoker     Years: 30.00     Quit date: 10/17/2002     Years since quitting: 16.9    Smokeless tobacco: Never Used   Substance and Sexual Activity    Alcohol use: Yes    Drug use: No    Sexual activity: Not on file   Lifestyle    Physical activity     Days per week: Not on file     Minutes per session: Not on file    Stress: Not on file   Relationships    Social connections     Talks on phone: Not on file     Gets together: Not on file     Attends religious service: Not on file     Active member of club or organization: Not on file     Attends meetings of clubs or organizations: Not on file     Relationship status: Not on file    Intimate partner violence     Fear of current or  ex partner: Not on file     Emotionally abused: Not on file     Physically abused: Not on file     Forced sexual activity: Not on file   Other Topics Concern    Not on file   Social History Narrative    Not on file       Allergies:   No Known Allergies    Medications:     Current Facility-Administered Medications   Medication Dose Route Frequency    carboxymethylcellulose (PF)  1 drop Both Eyes Q8H    chlorhexidine  15 mL Mouth/Throat Q12H SCH    docusate  100 mg Oral Daily    insulin glargine  10 Units Subcutaneous QHS    insulin glargine  15 Units Subcutaneous Daily    insulin lispro  2-10 Units Subcutaneous Q4H SCH    levETIRAcetam  500 mg Intravenous Q12H SCH    midodrine  5 mg per NG tube Q8H    modafinil  200 mg per OG tube Daily    piperacillin-tazobactam  2.25 g Intravenous Q6H    sodium chloride  1,000 mL Intravenous Once       Review of Systems:   As per HPI, all other systems were reviewed and were negative.    Physical Exam:     Vitals:    10/05/19 1700   BP: 118/59   Pulse: 82   Resp:    Temp:    SpO2: 97%       Intake and Output Summary (Last 24 hours) at Date Time    Intake/Output Summary (Last 24 hours) at 10/05/2019 1731  Last data filed at 10/05/2019 1700  Gross per 24 hour   Intake 2425.01 ml   Output 1662 ml   Net 763.01 ml       General appearance - alert, well appearing, and in no distress  Mental status - alert, oriented to person, place, and time  Eyes - pupils equal and reactive, extraocular eye movements intact  Mouth - mucous membranes moist, pharynx normal without lesions  Neck - supple, no significant adenopathy  Lymphatics - no palpable lymphadenopathy, no hepatosplenomegaly  Chest - clear to auscultation, no wheezes, rales or rhonchi, symmetric air entry  Heart - normal rate, regular rhythm, normal S1, S2, no murmurs, rubs, clicks or gallops  Abdomen - soft, nontender, nondistended, no masses or organomegaly  Neurological - alert, oriented, normal speech, no focal findings  or movement disorder noted  Musculoskeletal - no joint tenderness, deformity or swelling  Extremities - peripheral pulses normal, no pedal edema, no clubbing or cyanosis  Skin - normal coloration and turgor, no rashes, no suspicious skin lesions noted    Labs Reviewed:     Results     Procedure Component Value Units Date/Time    Prepare/Crossmatch Red Blood Cells [[id number]] Collected: 10/05/19 1511     Updated: 10/05/19 1627     RBC Leukoreduced RBC Leukoreduced     BLUNIT [id number]     Status issued     PRODUCT CODE (NON READABLE) E0336V00     Expiration Date [id number]     UTYPE A POS     ISBT CODE 6200    Type and Screen [[id number]] Collected: 10/05/19 1511    Specimen: Blood Updated: 10/05/19 1612     ABO Rh A POS     AB Screen Gel NEG    Glucose Whole Blood - POCT [[id number]]  (Abnormal) Collected: 10/05/19 1557     Updated: 10/05/19 1604     Whole Blood Glucose POCT 180 mg/dL     Glucose Whole Blood - POCT [[id number]]  (Abnormal) Collected: 10/05/19 1141     Updated: 10/05/19 1234     Whole Blood Glucose POCT 271 mg/dL     CULTURE BLOOD AEROBIC AND ANAEROBIC [[id number]] Collected: 10/01/19 0253    Specimen: Blood, Venipuncture Updated: 10/05/19 1221    Narrative:      The order will result in two separate 8-91ml bottles  Please do NOT order repeat blood cultures if one has been  drawn within the last 48 hours  UNLESS concerned for  endocarditis  AVOID BLOOD CULTURE DRAWS FROM CENTRAL LINE IF POSSIBLE  Indications:->Other  Other->Admission  ORDER#: [id number]                                    ORDERED BY: MENDIGUREN, IGN  SOURCE: Blood, Venipuncture left wrist               COLLECTED:  10/01/19 02:53  ANTIBIOTICS AT COLL.:                                RECEIVED :  10/01/19 12:12  Culture Blood Aerobic and Anaerobic        PRELIM      10/05/19 12:21  10/02/19   No Growth after 1 day/s of incubation.  10/03/19   No Growth after 2 day/s of incubation.  10/04/19   No Growth after 3 day/s of incubation.   10/05/19   No Growth after 4 day/s of incubation.      CULTURE BLOOD AEROBIC AND ANAEROBIC [[id number]] Collected: 10/01/19 0254    Specimen: Blood, Venipuncture Updated: 10/05/19 1221    Narrative:      The order will result in two separate  8-72ml bottles  Please do NOT order repeat blood cultures if one has been  drawn within the last 48 hours  UNLESS concerned for  endocarditis  AVOID BLOOD CULTURE DRAWS FROM CENTRAL LINE IF POSSIBLE  Indications:->Other  Other->Admission  ORDER#: [id number]                                    ORDERED BY: MENDIGUREN, IGN  SOURCE: Blood, Venipuncture left hand                COLLECTED:  10/01/19 02:54  ANTIBIOTICS AT COLL.:                                RECEIVED :  10/01/19 11:56  Culture Blood Aerobic and Anaerobic        PRELIM      10/05/19 12:21  10/02/19   No Growth after 1 day/s of incubation.  10/03/19   No Growth after 2 day/s of incubation.  10/04/19   No Growth after 3 day/s of incubation.  10/05/19   No Growth after 4 day/s of incubation.      Glucose Whole Blood - POCT [[id number]]  (Abnormal) Collected: 10/05/19 0728     Updated: 10/05/19 0812     Whole Blood Glucose POCT 259 mg/dL     GFR [[id number]] Collected: 10/05/19 0338     Updated: 10/05/19 0413     EGFR 38.5    Magnesium [[id number]] Collected: 10/05/19 0338    Specimen: Blood Updated: 10/05/19 0413     Magnesium 2.1 mg/dL     Renal function panel [[id number]]  (Abnormal) Collected: 10/05/19 0338    Specimen: Blood Updated: 10/05/19 0413     Glucose 239 mg/dL      Sodium 027 mEq/L      Potassium 4.0 mEq/L      Chloride 108 mEq/L      CO2 21 mEq/L      BUN 38.7 mg/dL      Calcium 7.9 mg/dL      Creatinine 1.8 mg/dL      Albumin 2.0 g/dL      Phosphorus 2.9 mg/dL      Anion Gap 9.0    Comprehensive metabolic panel [[id number]]  (Abnormal) Collected: 10/05/19 0338    Specimen: Blood Updated: 10/05/19 0413     Protein, Total 5.6 g/dL      AST (SGOT) 33 U/L      ALT 38 U/L      Alkaline Phosphatase 138 U/L       Bilirubin, Total 0.3 mg/dL      Globulin 3.6 g/dL      Albumin/Globulin Ratio 0.6    CBC and differential [[id number]]  (Abnormal) Collected: 10/05/19 0338    Specimen: Blood Updated: 10/05/19 0404     WBC 6.53 x10 3/uL      Hgb 7.4 g/dL      Hematocrit 87.5 %      Platelets 85 x10 3/uL      RBC 2.58 x10 6/uL      MCV 87.6 fL      MCH 28.7 pg      MCHC 32.7 g/dL      RDW 14 %      MPV 12.3 fL      Neutrophils 73.8 %      Lymphocytes Automated 12.3 %  Monocytes 8.7 %      Eosinophils Automated 4.4 %      Basophils Automated 0.3 %      Immature Granulocytes 0.5 %      Nucleated RBC 0.0 /100 WBC      Neutrophils Absolute 4.82 x10 3/uL      Lymphocytes Absolute Automated 0.80 x10 3/uL      Monocytes Absolute Automated 0.57 x10 3/uL      Eosinophils Absolute Automated 0.29 x10 3/uL      Basophils Absolute Automated 0.02 x10 3/uL      Immature Granulocytes Absolute 0.03 x10 3/uL      Absolute NRBC 0.00 x10 3/uL     Glucose Whole Blood - POCT [[id number]]  (Abnormal) Collected: 10/05/19 0024     Updated: 10/05/19 0046     Whole Blood Glucose POCT 257 mg/dL     Glucose Whole Blood - POCT [[id number]]  (Abnormal) Collected: 10/04/19 1943     Updated: 10/04/19 1946     Whole Blood Glucose POCT 212 mg/dL               Rads:   Radiological Procedure reviewed.   No results found.

## 2019-10-06 ENCOUNTER — Inpatient Hospital Stay: Payer: PRIVATE HEALTH INSURANCE

## 2019-10-06 LAB — ECG 12-LEAD
Atrial Rate: 80 {beats}/min
Atrial Rate: 80 {beats}/min
Atrial Rate: 80 {beats}/min
P Axis: 56 degrees
P Axis: 60 degrees
P Axis: 67 degrees
P-R Interval: 262 ms
P-R Interval: 278 ms
P-R Interval: 296 ms
Q-T Interval: 414 ms
Q-T Interval: 418 ms
Q-T Interval: 432 ms
QRS Duration: 118 ms
QRS Duration: 120 ms
QRS Duration: 120 ms
QTC Calculation (Bezet): 477 ms
QTC Calculation (Bezet): 482 ms
QTC Calculation (Bezet): 498 ms
R Axis: -41 degrees
R Axis: -42 degrees
R Axis: -42 degrees
T Axis: 104 degrees
T Axis: 105 degrees
T Axis: 106 degrees
Ventricular Rate: 80 {beats}/min
Ventricular Rate: 80 {beats}/min
Ventricular Rate: 80 {beats}/min

## 2019-10-06 LAB — CBC AND DIFFERENTIAL
Absolute NRBC: 0 10*3/uL (ref 0.00–0.00)
Basophils Absolute Automated: 0.02 10*3/uL (ref 0.00–0.08)
Basophils Automated: 0.3 %
Eosinophils Absolute Automated: 0.44 10*3/uL (ref 0.00–0.44)
Eosinophils Automated: 6.4 %
Hematocrit: 26.1 % — ABNORMAL LOW (ref 37.6–49.6)
Hgb: 8.7 g/dL — ABNORMAL LOW (ref 12.5–17.1)
Immature Granulocytes Absolute: 0.03 10*3/uL (ref 0.00–0.07)
Immature Granulocytes: 0.4 %
Lymphocytes Absolute Automated: 1.24 10*3/uL (ref 0.42–3.22)
Lymphocytes Automated: 18 %
MCH: 28.2 pg (ref 25.1–33.5)
MCHC: 33.3 g/dL (ref 31.5–35.8)
MCV: 84.7 fL (ref 78.0–96.0)
MPV: 12.6 fL — ABNORMAL HIGH (ref 8.9–12.5)
Monocytes Absolute Automated: 0.8 10*3/uL (ref 0.21–0.85)
Monocytes: 11.6 %
Neutrophils Absolute: 4.36 10*3/uL (ref 1.10–6.33)
Neutrophils: 63.3 %
Nucleated RBC: 0 /100 WBC (ref 0.0–0.0)
Platelets: 100 10*3/uL — ABNORMAL LOW (ref 142–346)
RBC: 3.08 10*6/uL — ABNORMAL LOW (ref 4.20–5.90)
RDW: 17 % — ABNORMAL HIGH (ref 11–15)
WBC: 6.89 10*3/uL (ref 3.10–9.50)

## 2019-10-06 LAB — GLUCOSE WHOLE BLOOD - POCT
Whole Blood Glucose POCT: 208 mg/dL — ABNORMAL HIGH (ref 70–100)
Whole Blood Glucose POCT: 208 mg/dL — ABNORMAL HIGH (ref 70–100)
Whole Blood Glucose POCT: 140 mg/dL — ABNORMAL HIGH (ref 70–100)
Whole Blood Glucose POCT: 76 mg/dL (ref 70–100)
Whole Blood Glucose POCT: 64 mg/dL — ABNORMAL LOW (ref 70–100)

## 2019-10-06 LAB — ARTERIAL BLOOD GAS (~~LOC~~)
ARTERIAL PCO2 CORRECTED: 35 mmHg (ref 35.0–45.0)
ARTERIAL PH CORRECTED: 7.47 — ABNORMAL HIGH (ref 7.35–7.45)
Arterial pO2 Corrected: 103 mmHg — ABNORMAL HIGH (ref 80.0–100.0)
Base Excess, Arterial: 2.1 mEq/L — ABNORMAL HIGH (ref <=2.0)
CPAP: 5 cmH2O
FIO2: 30 %
HCO3, Arterial: 25.5 mEq/L (ref 22.0–28.0)
O2 Sat, Arterial: 98.3 % (ref 95.0–100.0)
Pressure Support: 10
Temperature: 98.6
pCO2, Arterial: 35 mmHg (ref 35.0–45.0)
pH, Arterial: 7.47 — ABNORMAL HIGH (ref 7.35–7.45)
pO2, Arterial: 103 mmHg — ABNORMAL HIGH (ref 80.0–100.0)

## 2019-10-06 LAB — RENAL FUNCTION PANEL
Albumin: 2 g/dL — ABNORMAL LOW (ref 3.5–5.0)
Anion Gap: 10 (ref 5.0–15.0)
BUN: 42.7 mg/dL — ABNORMAL HIGH (ref 9.0–28.0)
CO2: 21 mEq/L — ABNORMAL LOW (ref 22–29)
Calcium: 8 mg/dL — ABNORMAL LOW (ref 8.5–10.5)
Chloride: 109 mEq/L (ref 100–111)
Creatinine: 1.8 mg/dL — ABNORMAL HIGH (ref 0.7–1.3)
Glucose: 176 mg/dL — ABNORMAL HIGH (ref 70–100)
Phosphorus: 2.5 mg/dL (ref 2.3–4.7)
Potassium: 4.1 mEq/L (ref 3.5–5.1)
Sodium: 140 mEq/L (ref 136–145)

## 2019-10-06 LAB — MAGNESIUM: Magnesium: 1.8 mg/dL (ref 1.6–2.6)

## 2019-10-06 LAB — GFR: EGFR: 38.5

## 2019-10-06 MED ORDER — INSULIN GLARGINE 100 UNIT/ML SC SOLN
10.00 [IU] | Freq: Every evening | SUBCUTANEOUS | Status: DC
Start: 2019-10-07 — End: 2019-10-07

## 2019-10-06 MED ORDER — INSULIN GLARGINE 100 UNIT/ML SC SOLN
10.00 [IU] | Freq: Every day | SUBCUTANEOUS | Status: DC
Start: 2019-10-07 — End: 2019-10-07

## 2019-10-06 NOTE — Progress Notes (Signed)
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  CT head negative.  EEG has been done.    Awaiting neuro input.    #pulm: Serratia marcescens pneumonia.  Continue vent support.  Discussed with RT.  Vent check discussed with RT      #cardiac: Cardiac arrest with complete heart block in the setting of sepsis and septic shock.   h/o NSTEMI 01/2018, with CABG 01/2019. Ischemic cardiomyopathy,   EF of 20 to 25% status post redo heart block on admission.  [facility] following.    #nutrition: npo, appreciate nutritionist recommendation. Continue tube feeds    # renal: Acute renal failure with fluid overload  IV Lasix per nephro.      #Heme: thrombocytopenia from sepsis,     #endocrine: diabetes mellitus, Hemoglobin A1c:6.4%,  resolved DKA, uncontrolled,  add lantus and SSI.    #ID: + urinary tract infection, + urine analysis. + Serratia marcescens pneumonia. Sepsis, septic shock. Continue with antibiotics  COVID negative      #Prophylaxis:   GI Prophylaxis:  +  VTE Prophylaxis:+      #Code Status: full code         That time to ICU rounds.  Critical care time 35 to 40 minutes.  Discussed with Dr. Nedra Hai.    Subjective:   63 year old male with history of diabetes, hypertension, prior stroke, coronary artery disease with coronary artery bypass graft 4/202 presents with cardiac arrest from complete heart block in the setting of sepsis and septic shock.    Intubated, off sedation, pt can open eyes and track, also squeeze hands on command    Hospital course  12/14 admitted to ICU. [place] protocol,  lactic elevated-->started bicarb drip    heart rhythm changed from V-paced in the 70s to RBBB HR 100-111, EKG done and responded to decrease in dobutamine drip. started insulin drip.  12/15 Dobutamine off  12/17 added keppra and propofol for seizure like activity. TF @ goal   12/18- propofol off @0911  no change in neuro status   12/19: following commands    Medications:   Scheduled Meds:  Current Facility-Administered Medications   Medication Dose Route Frequency    carboxymethylcellulose (PF)  1 drop Both Eyes Q8H    chlorhexidine  15 mL Mouth/Throat Q12H SCH    docusate  100 mg Oral Daily    insulin glargine  10 Units Subcutaneous QHS    insulin glargine  15 Units Subcutaneous Daily    insulin lispro  2-10 Units Subcutaneous Q4H SCH    levETIRAcetam  500 mg Intravenous Q12H SCH    midodrine  10 mg per NG tube Q8H    modafinil  200 mg per OG tube Daily    piperacillin-tazobactam  2.25 g Intravenous Q6H    sodium chloride  1,000 mL Intravenous Once         Continuous Infusions:   norepinephrine (LEVOPHED) infusion Stopped (10/05/19 1302)  Physical Exam:     Vitals:    10/06/19 1000   BP:    Pulse:    Resp:    Temp: 99.3 F (37.4 C)   SpO2:          Intake/Output Summary (Last 24 hours) at 10/06/2019 1032  Last data filed at 10/06/2019 5188  Gross per 24 hour   Intake 2154.26 ml   Output 2125 ml   Net 29.26 ml       General Appearance: Intubated, older than stated age , awake, tracking, diffusely weak  CV: Regular rate, no m/r/g  Pulm: crackles  Abd: Nondistended  Extremities: + edema in upper arms > legs    Data:     Vent Settings:    Vent Settings  Vent Mode: PRVC  FiO2: 30 %  Resp Rate (Set): 12  Vt (Set, mL): 450 mL  PIP Observed (cm H2O): 22 cm H2O  PEEP/EPAP: 5 cm H20  Pressure Support / IPAP: (S) 5 cmH20  Mean Airway Pressure: 9 cmH20    Labs:       Labs (last 72 hours):  Recent Labs     10/06/19  0329 10/05/19  0338 10/04/19  0412   WBC 6.89 6.53 8.64   Hgb 8.7* 7.4* 7.9*   Hematocrit  26.1* 22.6* 23.7*     No results for input(s): PT, INR, PTT in the last 72 hours. Recent Labs     10/06/19  0329 10/05/19  0338 10/04/19  0412   Sodium 140 138 140   Potassium 4.1 4.0 4.0   Chloride 109 108 108   CO2 21* 21* 21*   BUN 42.7* 38.7* 37.6*   Creatinine 1.8* 1.8* 1.8*   Glucose 176* 239* 180*   Calcium 8.0* 7.9* 7.9*   Magnesium 1.8 2.1 1.8   Phosphorus 2.5 2.9 1.7*               Rads:          Renal ultrasound    10/02/2019    "No hydronephrosis. "    Radiological Imaging personally reviewed,and agree with radiology report including:     Head cT without contrast    10/03/2019   "1. No acute intracranial abnormality is identified.   2. Chronic ischemic changes"     CXR    09/30/2019  "Lines in good position. Hypoventilation with left basilar atelectasis.   No significant consolidation or pneumothorax. "    I have personally reviewed the patients history and 24 hour interval events, along with vitals, labs, radiology images and nursing. So far today I have spent _45_ minutes providing care for this patient excluding teaching and billable procedures, and not overlapping with any other providers.    Signed by: Wells Guiles, MD  Date/Time: 10/06/19 10:32 AM    *This note was generated by the Epic EMR system/ Dragon speech recognition and may contain inherent errors or omissions not intended by the user. Grammatical errors, random word insertions, deletions, pronoun errors and incomplete sentences are occasional consequences of this technology due to software limitations. Not all errors are caught or corrected. If there are questions or concerns about the content of this note or information contained within the body of this dictation they should be addressed directly with the author for clarification

## 2019-10-06 NOTE — PT Eval Note (Signed)
 [facility]  [address]  [place], [place]. [address]    Department of Rehabilitation  [phone]    Physical Therapy Evaluation    Patient: Keith Gates    MRN#: [id number]     IC03/IC03-A    Time of treatment: Time Calculation  PT Received On: 10/06/19  Start Time: 1400  Stop Time: 1425  Time Calculation (min): 25 min  Total Treatment Time (min): 8    PT Visit Number: 1    Consult received for [name] for PT Evaluation and Treatment.  Patient's medical condition is appropriate for Physical therapy intervention at this time.      Assessment:   Keith Gates is a 63 y.o. male admitted 09/30/2019.  Pt's functional mobility is impacted by:  decreased activity tolerance, decreased balance, decreased bed mobility, edema, gait impairment, respiratory status, decreased strength and transfers .  There are a few comorbidities or other factors that affect plan of care and require modification of task including: assistive device needed for mobility and recent surgery.  Standardized tests and exams incorporated into evaluation include AMPAC mobility, ROM  and Strength.  Pt demonstrates a stable clinical presentation.   Pt would continue to benefit from PT to address these deficits and increase functional independence.     Complexity Level Hx and Co  morbidites Examination Clinical Decision Making Clinical Presentation   Low no impact 1-2 elements Limited options Stable   Moderate   1-2 factors 3 or more   Several options Evolving, plan may alter   High 3 or more 4 or more Multiple options Unstable, unpredictable       Impairments: Assessment: Decreased UE strength;Decreased LE strength;Decreased endurance/activity tolerance;Impaired coordination;Impaired motor control;Decreased functional mobility;Decreased balance;Gait impairment.     Therapy Diagnosis: generalized weakness, decreased functional mobility , decreased independence with ADL's, increased gait dysfunction, decreased endurance/  activity engagement and disequilibrium due to respiratory failure secondary to cardiac arrest, on mechanical ventilation. Without therapy interventions, patient is at risk for dependence on caregivers for mobility, dependence on caregivers for ADL's, decreased independence and failure to return to PLOF.    Rehabilitation Potential: Prognosis: Good;With continued PT status post acute discharge;Patient has multiple medical complications;Patient remains in ICU/critical care      Plan:    Treatment/Interventions: Exercise;Gait training;Neuromuscular re-education;Functional transfer training;LE strengthening/ROM;Endurance training;Patient/family training;Bed mobility;Continued evaluation PT Frequency: 3-4x/wk    Risks/Benefits/POC Discussed with Pt/Family: With patient          Goals:   Goals  Goal Formulation: With patient  Time for Goal Acheivement: By time of discharge  Goals: Select goal  Pt Will Go Supine To Sit: with moderate assist;to maximize functional mobility and independence;3 visits  Pt Will Perform Sit to Stand: with maximal assist;to maximize functional mobility and independence;3 visits  Pt Will Ambulate: 1-10 feet;with rolling walker;with moderate assist;to maximize functional mobility and independence;5 visits      Discharge Recommendations:   Based on today's session patient's discharge recommendation is the following: Discharge Recommendation: SNF   DME Recommended for Discharge: Other (Comment)(TBD at rehab)    If Discharge Recommendation: SNF is not available, then the patient will need acute rehab.         Precautions and Contraindications:   Precautions  Other Precautions: ETT    Medical Diagnosis: Cardiac arrest [I46.9]  Heart block [I45.9]    History of Present Illness: Keith Gates is a 63 y.o. male admitted on 09/30/2019 with history of diabetes, hypertension, prior stroke, coronary  artery disease with coronary artery bypass graft 4/202 presents with cardiac arrest from complete heart  block in the setting of sepsis and septic shock.    Intubated, off sedation, pt can open eyes and track, also squeeze hands on command    Hospital course  12/14 admitted to ICU. [place] protocol, lactic elevated-->started bicarb drip    heart rhythm changed from V-paced in the 70s to RBBB HR 100-111, EKG done and responded to decrease in dobutamine drip. started insulin drip.  12/15 Dobutamine off  12/17 added keppra and propofol for seizure like activity. TF @ goal   12/18- propofol off @0911  no change in neuro status   12/19: following commands  - per Med/Surg Critical Care Progress Note 10/05/2019    Patient Active Problem List   Diagnosis   . Non-STEMI (non-ST elevated myocardial infarction)   . DKA (diabetic ketoacidoses)   . Heart block   . Cardiac arrest   . Iron deficiency anemia, unspecified iron deficiency anemia type        Past Medical/Surgical History:  Past Medical History:   Diagnosis Date   . Diabetes mellitus    . Elevated cholesterol    . Hypertension    . Stroke       Past Surgical History:   Procedure Laterality Date   . CORONARY ARTERY BYPASS GRAFT  01/2018   . TEMPORARY PACER INSERTION N/A 09/30/2019    Procedure: TEMPORARY PACER INSERTION;  Surgeon: Acie Fredrickson, MD;  Location: [facility] CATH/EP;  Service: Cardiovascular;  Laterality: N/A;       Social History:  Prior Level of Function  Prior level of function: Ambulates independently, Needs assistance with ADLs  Baseline Activity Level: No independent activity  DME Currently at Home: [id number], [employer] [place], [place], [employer]  Home Living Arrangements  Living Arrangements: Spouse/significant other  Type of Home: House  Home Layout: Multi-level, Able to live on main level with bedroom/bathroom, Stairs to enter with rails (add number in comment)(2 STE)  Bathroom Shower/Tub: Tub/shower unit  DME Currently at Home: [employer], [employer], [place], [employer] [facility] - Notes / Comments: Per daughter, patient relying on spouse to assist with  ADLs and not walking much PTA      Subjective:    Patient is agreeable to participation in the therapy session.   Patient Goal  Patient Goal: Unable to state, currently with ETT       Objective:   Observation of Patient/Vital Signs:  Patient is in bed with restraints, telemetry, pressure relief boots, SCD's, central line, peripheral IV, mechanical ventilation via oral intubation and NG tube/NJ tube/[place]ak, indwelling urinary catheter in place.    Inspection/Posture  Inspection/Posture: increased edema in BUE and BLE    Cognition/Neuro Status  Arousal/Alertness: Appropriate responses to stimuli  Attention Span: Appears intact  Orientation Level: Unable to assess  Following Commands: Follows one step commands with increased time(Following commands, but often slow to respond.)  Behavior: calm;cooperative  Motor Planning: decreased processing speed;decreased initiation  Coordination: FMC impaired;GMC impaired      Hearing: WNL  Vision: WNL  Sensation: WNL    Gross ROM  Right Upper Extremity ROM: within functional limits  Left Upper Extremity ROM: within functional limits  Right Lower Extremity ROM: within functional limits  Left Lower Extremity ROM: within functional limits  Gross Strength  Right Upper Extremity Strength: 2-/5  Left Upper Extremity Strength: 2-/5  Right Lower Extremity Strength: 2-/5  Left Lower Extremity Strength: 2-/5  PMP Activity: Step 2 - Supine Exercises          Participation and Endurance  Participation Effort: good  Endurance: Tolerates 10 - 20 min exercise with multiple rests         AM-PACT Inpatient Short Forms  Inpatient AM-PACT Performed? (PT): Basic Mobility Inpatient Short Form  AM-PACT "6 Clicks" Basic Mobility Inpatient Short Form  Turning Over in Bed: A lot  Sitting Down On/Standing From Armchair: A lot  Lying on Back to Sitting on Side of Bed: A lot  Assist Moving to/from Bed to Chair: Total  Assist to Walk in Hospital Room: Total  Assist to Climb 3-5 Steps with  Railing: Total  PT Basic Mobility Raw Score: 9  CMS 0-100% Score: 81.38%    Treatment Activities: Performed BLE and BUE bed ex with max A, 2x10 each (AP, GS, QS, hand squeezes, elbow flex/ext, shoulder flex/ext). Patient instructed to perform hand squeezes and AP throughout the day to increase circulation. Patient able to demonstrate in understanding.    Educated the patient to role of physical therapy, plan of care, goals of therapy and HEP, safety with mobility and ADLs.          Therapist PPE during session procedural mask, goggles  and gloves

## 2019-10-06 NOTE — Progress Notes (Signed)
Infectious Disease            Progress Note    10/06/2019   Keith Gates ZOX:[id number] is a 63 y.o. male, history of diabetes mellitus, hypertension, hyperlipidemia, stroke, coronary artery disease status post coronary artery bypass grafting who was admitted after cardiac arrest, CPR, with sepsis, UTI, possible pneumonia, aspiration.    Subjective:     [name] today Symptoms: Afebrile, awake and responsive, intubated. Other review of system is non contributory.    Objective:     Blood pressure 130/61, pulse 85, temperature 99.1 F (37.3 C), resp. rate 12, height 1.803 m (5\' 11" ), weight 81.3 kg (179 lb 3.7 oz), SpO2 97 %.    General Appearance:  Awake and responsive, intubated  HEENT: Pallor positive, Anicteric sclera.   Neck: Supple  Lungs:Decreased breath sound at bases.   Chest Wall: Symmetric chest wall expansion.   Heart : S1 and S2.   Abdomen: Abdomen is soft, bowel sounds positive.  Neurological: Intubated, responsive    Laboratory And Diagnostic Studies:     Recent Labs     10/06/19  0329 10/05/19  0338   WBC 6.89 6.53   Hgb 8.7* 7.4*   Hematocrit 26.1* 22.6*   Platelets 100* 85*   Neutrophils 63.3 73.8     Recent Labs     10/06/19  0329 10/05/19  0338   Sodium 140 138   Potassium 4.1 4.0   Chloride 109 108   CO2 21* 21*   BUN 42.7* 38.7*   Creatinine 1.8* 1.8*   Glucose 176* 239*   Calcium 8.0* 7.9*     Recent Labs     10/06/19  0329 10/05/19  0338 10/04/19  0412   AST (SGOT)  --  33 43*   ALT  --  38 47   Alkaline Phosphatase  --  138* 110*   Protein, Total  --  5.6* 5.5*   Albumin 2.0* 2.0* 2.1*   Bilirubin, Total  --  0.3 0.4       Current Med's:     Current Facility-Administered Medications   Medication Dose Route Frequency    carboxymethylcellulose (PF)  1 drop Both Eyes Q8H    chlorhexidine  15 mL Mouth/Throat Q12H SCH    docusate  100 mg Oral Daily    insulin glargine  10 Units Subcutaneous QHS    insulin glargine  15 Units Subcutaneous Daily    insulin lispro   2-10 Units Subcutaneous Q4H SCH    levETIRAcetam  500 mg Intravenous Q12H SCH    midodrine  10 mg per NG tube Q8H    modafinil  200 mg per OG tube Daily    piperacillin-tazobactam  2.25 g Intravenous Q6H    sodium chloride  1,000 mL Intravenous Once       Lines/Drains:     Patient Lines/Drains/Airways Status    Active Lines, Drains and Airways     Name:   Placement date:   Placement time:   Site:   Days:    CVC Triple Lumen 09/30/19 Left Internal jugular   09/30/19    2240    Internal jugular   5    Peripheral IV 09/30/19 18 G Anterior;Distal;Left Upper Arm   09/30/19    1342    Upper Arm   5    Peripheral IV 09/30/19 20 G Distal;Posterior;Right Forearm   09/30/19    1444    Forearm   5    Urethral Catheter  Double-lumen 16 Fr.   09/30/19    1413    Double-lumen   5    ETT  7 mm   09/30/19    1402     5    NG/OG Tube Orogastric 18 Fr. 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 Grammatical errors, random word insertions, deletions, pronoun errors and incomplete sentences are occasional consequences of this technology due to software limitations. Not all errors  are caught or corrected. If there are questions or concerns about the content of this note or information contained within the body of this dictation they should be addressed directly with the author for clarification

## 2019-10-06 NOTE — Progress Notes (Signed)
Subjective:  Interval History: Doing amazingly well from a neurology perspective  Alert and oriented to the exact month and year.. tells me he has two children and their ages  Agree rehab  Continue supportive care       Objective:  Last 24 Hour Vital Signs:  Temp:  [99.3 F (37.4 C)-99.5 F (37.5 C)] 99.3 F (37.4 C)  Heart Rate:  [66-85] 77  Resp Rate:  [12-23] 17  BP: (101-138)/(49-74) 134/63  FiO2:  [30 %] 30 %    Physical Exam:    Alert and oriented   to the exact month and year.. tells me he has two children and their ages    Head atraumatic Normocephalic  Eyes extraocular muscles intact  Motor Good functional strength in all extremities  Gait not attempted    Scheduled Meds:  Current Facility-Administered Medications   Medication Dose Route Frequency    carboxymethylcellulose (PF)  1 drop Both Eyes Q8H    chlorhexidine  15 mL Mouth/Throat Q12H SCH    docusate  100 mg Oral Daily    [START ON 10/07/2019] insulin glargine  10 Units Subcutaneous QHS    [START ON 10/07/2019] insulin glargine  10 Units Subcutaneous Daily    insulin lispro  2-10 Units Subcutaneous Q4H SCH    levETIRAcetam  500 mg Intravenous Q12H SCH    midodrine  10 mg per NG tube Q8H    modafinil  200 mg per OG tube Daily    piperacillin-tazobactam  2.25 g Intravenous Q6H    sodium chloride  1,000 mL Intravenous Once       Continuous Infusions:   norepinephrine (LEVOPHED) infusion Stopped (10/05/19 1302)       PRN Meds:  sodium chloride, Nursing communication: Adult Hypoglycemia Treatment Algorithm **AND** dextrose **AND** dextrose **AND** glucagon (rDNA)    Last 24 Hour Labs:  Results     Procedure Component Value Units Date/Time    Glucose Whole Blood - POCT [[id number]] Collected: 10/06/19 1912     Updated: 10/06/19 1943     Whole Blood Glucose POCT 76 mg/dL     Glucose Whole Blood - POCT [[id number]]  (Abnormal) Collected: 10/06/19 1516     Updated: 10/06/19 1528     Whole Blood Glucose POCT 140 mg/dL     Arterial blood gas  [[id number]]  (Abnormal) Collected: 10/06/19 1451     Updated: 10/06/19 1505     Status Oxygen     ABG CollectionSite Left Radl     Allen's Test Yes     Temperature 98.6     ARTERIAL PH CORRECTED 7.47     ARTERIAL PCO2 CORRECTED 35.0 mmHg      Arterial pO2 Corrected 103.0 mmHg      pH, Arterial 7.47     pCO2, Arterial 35.0 mmHg      pO2, Arterial 103.0 mmHg      HCO3, Arterial 25.5 mEq/L      Base Excess, Arterial 2.1 mEq/L      O2 Sat, Arterial 98.3 %      FIO2 30.0 %      Mode: CPAP/PS     Pressure Support 10     CPAP 5.0 cmH2O     CULTURE BLOOD AEROBIC AND ANAEROBIC [[id number]] Collected: 10/01/19 0253    Specimen: Blood, Venipuncture Updated: 10/06/19 1421    Narrative:      The order will result in two separate 8-62ml bottles  Please do NOT order repeat blood cultures if one has been  drawn within the last 48 hours  UNLESS concerned for  endocarditis  AVOID BLOOD CULTURE DRAWS FROM CENTRAL LINE IF POSSIBLE  Indications:->Other  Other->Admission  ORDER#: [id number]                                    ORDERED BY: MENDIGUREN, IGN  SOURCE: Blood, Venipuncture left wrist               COLLECTED:  10/01/19 02:53  ANTIBIOTICS AT COLL.:                                RECEIVED :  10/01/19 12:12  Culture Blood Aerobic and Anaerobic        FINAL       10/06/19 14:21  10/06/19   No growth after 5 days of incubation.      CULTURE BLOOD AEROBIC AND ANAEROBIC [[id number]] Collected: 10/01/19 0254    Specimen: Blood, Venipuncture Updated: 10/06/19 1421    Narrative:      The order will result in two separate 8-89ml bottles  Please do NOT order repeat blood cultures if one has been  drawn within the last 48 hours  UNLESS concerned for  endocarditis  AVOID BLOOD CULTURE DRAWS FROM CENTRAL LINE IF POSSIBLE  Indications:->Other  Other->Admission  ORDER#: [id number]                                    ORDERED BY: MENDIGUREN, IGN  SOURCE: Blood, Venipuncture left hand                COLLECTED:  10/01/19 02:54  ANTIBIOTICS AT COLL.:                                 RECEIVED :  10/01/19 11:56  Culture Blood Aerobic and Anaerobic        FINAL       10/06/19 14:21  10/06/19   No growth after 5 days of incubation.      Glucose Whole Blood - POCT [[id number]]  (Abnormal) Collected: 10/06/19 1104     Updated: 10/06/19 1114     Whole Blood Glucose POCT 208 mg/dL     Glucose Whole Blood - POCT [[id number]]  (Abnormal) Collected: 10/06/19 0752     Updated: 10/06/19 0755     Whole Blood Glucose POCT 208 mg/dL     Renal function panel [[id number]]  (Abnormal) Collected: 10/06/19 0329    Specimen: Blood Updated: 10/06/19 0427     Glucose 176 mg/dL      Sodium 536 mEq/L      Potassium 4.1 mEq/L      Chloride 109 mEq/L      CO2 21 mEq/L      BUN 42.7 mg/dL      Calcium 8.0 mg/dL      Creatinine 1.8 mg/dL      Albumin 2.0 g/dL      Phosphorus 2.5 mg/dL      Anion Gap 64.4    GFR [[id number]] Collected: 10/06/19 0329     Updated: 10/06/19 0427     EGFR 38.5    Magnesium [[id number]] Collected: 10/06/19 0329    Specimen: Blood Updated: 10/06/19 0427  Magnesium 1.8 mg/dL     CBC and differential [[id number]]  (Abnormal) Collected: 10/06/19 0329    Specimen: Blood Updated: 10/06/19 0401     WBC 6.89 x10 3/uL      Hgb 8.7 g/dL      Hematocrit 40.9 %      Platelets 100 x10 3/uL      RBC 3.08 x10 6/uL      MCV 84.7 fL      MCH 28.2 pg      MCHC 33.3 g/dL      RDW 17 %      MPV 12.6 fL      Neutrophils 63.3 %      Lymphocytes Automated 18.0 %      Monocytes 11.6 %      Eosinophils Automated 6.4 %      Basophils Automated 0.3 %      Immature Granulocytes 0.4 %      Nucleated RBC 0.0 /100 WBC      Neutrophils Absolute 4.36 x10 3/uL      Lymphocytes Absolute Automated 1.24 x10 3/uL      Monocytes Absolute Automated 0.80 x10 3/uL      Eosinophils Absolute Automated 0.44 x10 3/uL      Basophils Absolute Automated 0.02 x10 3/uL      Immature Granulocytes Absolute 0.03 x10 3/uL      Absolute NRBC 0.00 x10 3/uL     Prepare/Crossmatch Red Blood Cells [[id number]] Collected: 10/05/19 1511      Updated: 10/06/19 0017     RBC Leukoreduced RBC Leukoreduced     BLUNIT [id number]     Status transfused     PRODUCT CODE (NON READABLE) [id number]     Expiration Date [id number]     UTYPE A POS     ISBT CODE 6200    Glucose Whole Blood - POCT [[id number]]  (Abnormal) Collected: 10/05/19 2315     Updated: 10/05/19 2318     Whole Blood Glucose POCT 167 mg/dL           Last 24 Hour Radiology:  Radiology Results (24 Hour)     Procedure Component Value Units Date/Time    XR Chest AP Portable [[id number]] Collected: 10/06/19 0914    Order Status: Completed Updated: 10/06/19 0925    Narrative:      Exam: Chest AP portable    INDICATION: Shortness of breath    COMPARISON: 09/30/2019    FINDINGS:    Increasing hazy opacity in the right lower lung. Left lung base  atelectasis. No pleural effusion or pneumothorax. Stable heart size.    Endotracheal tube with tip in mid trachea in functional position.  Enteric tube with distal tip below the image field-of-view. Left IJ  approach central venous catheter with tip likely in the left innominate  vein.      Impression:          1. Left IJ approach central venous catheter with tip likely in the left  innominate vein.  2. Slightly increasing right lung base opacities compared to prior.    Jorge Mandril, MD   10/06/2019 9:23 AM          Assessment:    Principal Problem:  Heart block    Active Problems:   Patient Active Problem List   Diagnosis    Non-STEMI (non-ST elevated myocardial infarction)    DKA (diabetic ketoacidoses)    Heart block    Cardiac arrest    Iron deficiency anemia, unspecified  iron deficiency anemia type       Plan:    Doing amazingly well from a neurology perspective  Alert and oriented to the exact month and year.. tells me he has two children and their ages  Agree rehab  Continue supportive care    Brenan Modesto Maye Hides, MD  Neurology

## 2019-10-07 DIAGNOSIS — E1110 Type 2 diabetes mellitus with ketoacidosis without coma: Secondary | ICD-10-CM

## 2019-10-07 DIAGNOSIS — I469 Cardiac arrest, cause unspecified: Secondary | ICD-10-CM

## 2019-10-07 DIAGNOSIS — D509 Iron deficiency anemia, unspecified: Secondary | ICD-10-CM

## 2019-10-07 DIAGNOSIS — I214 Non-ST elevation (NSTEMI) myocardial infarction: Secondary | ICD-10-CM

## 2019-10-07 LAB — GLUCOSE WHOLE BLOOD - POCT
Whole Blood Glucose POCT: 112 mg/dL — ABNORMAL HIGH (ref 70–100)
Whole Blood Glucose POCT: 128 mg/dL — ABNORMAL HIGH (ref 70–100)
Whole Blood Glucose POCT: 134 mg/dL — ABNORMAL HIGH (ref 70–100)
Whole Blood Glucose POCT: 135 mg/dL — ABNORMAL HIGH (ref 70–100)
Whole Blood Glucose POCT: 147 mg/dL — ABNORMAL HIGH (ref 70–100)
Whole Blood Glucose POCT: 156 mg/dL — ABNORMAL HIGH (ref 70–100)
Whole Blood Glucose POCT: 166 mg/dL — ABNORMAL HIGH (ref 70–100)
Whole Blood Glucose POCT: 64 mg/dL — ABNORMAL LOW (ref 70–100)
Whole Blood Glucose POCT: 79 mg/dL (ref 70–100)
Whole Blood Glucose POCT: 97 mg/dL (ref 70–100)

## 2019-10-07 LAB — CBC AND DIFFERENTIAL
Absolute NRBC: 0 10*3/uL (ref 0.00–0.00)
Basophils Absolute Automated: 0.03 10*3/uL (ref 0.00–0.08)
Basophils Automated: 0.4 %
Eosinophils Absolute Automated: 0.54 10*3/uL — ABNORMAL HIGH (ref 0.00–0.44)
Eosinophils Automated: 7.2 %
Hematocrit: 24.2 % — ABNORMAL LOW (ref 37.6–49.6)
Hgb: 8.1 g/dL — ABNORMAL LOW (ref 12.5–17.1)
Immature Granulocytes Absolute: 0.03 10*3/uL (ref 0.00–0.07)
Immature Granulocytes: 0.4 %
Lymphocytes Absolute Automated: 1.3 10*3/uL (ref 0.42–3.22)
Lymphocytes Automated: 17.2 %
MCH: 28.7 pg (ref 25.1–33.5)
MCHC: 33.5 g/dL (ref 31.5–35.8)
MCV: 85.8 fL (ref 78.0–96.0)
MPV: 11.9 fL (ref 8.9–12.5)
Monocytes Absolute Automated: 0.99 10*3/uL — ABNORMAL HIGH (ref 0.21–0.85)
Monocytes: 13.1 %
Neutrophils Absolute: 4.65 10*3/uL (ref 1.10–6.33)
Neutrophils: 61.7 %
Nucleated RBC: 0 /100 WBC (ref 0.0–0.0)
Platelets: 108 10*3/uL — ABNORMAL LOW (ref 142–346)
RBC: 2.82 10*6/uL — ABNORMAL LOW (ref 4.20–5.90)
RDW: 17 % — ABNORMAL HIGH (ref 11–15)
WBC: 7.54 10*3/uL (ref 3.10–9.50)

## 2019-10-07 LAB — COMPREHENSIVE METABOLIC PANEL
ALT: 53 U/L (ref 0–55)
AST (SGOT): 48 U/L — ABNORMAL HIGH (ref 5–34)
Albumin/Globulin Ratio: 0.6 — ABNORMAL LOW (ref 0.9–2.2)
Albumin: 2.1 g/dL — ABNORMAL LOW (ref 3.5–5.0)
Alkaline Phosphatase: 177 U/L — ABNORMAL HIGH (ref 38–106)
Anion Gap: 10 (ref 5.0–15.0)
BUN: 41.2 mg/dL — ABNORMAL HIGH (ref 9.0–28.0)
Bilirubin, Total: 0.4 mg/dL (ref 0.2–1.2)
CO2: 22 mEq/L (ref 22–29)
Calcium: 8.4 mg/dL — ABNORMAL LOW (ref 8.5–10.5)
Chloride: 112 mEq/L — ABNORMAL HIGH (ref 100–111)
Creatinine: 1.6 mg/dL — ABNORMAL HIGH (ref 0.7–1.3)
Globulin: 3.5 g/dL (ref 2.0–3.6)
Glucose: 79 mg/dL (ref 70–100)
Potassium: 4.1 mEq/L (ref 3.5–5.1)
Protein, Total: 5.6 g/dL — ABNORMAL LOW (ref 6.0–8.3)
Sodium: 144 mEq/L (ref 136–145)

## 2019-10-07 LAB — GFR: EGFR: 44.1

## 2019-10-07 LAB — RENAL FUNCTION PANEL: Phosphorus: 2.3 mg/dL (ref 2.3–4.7)

## 2019-10-07 LAB — MAGNESIUM: Magnesium: 1.8 mg/dL (ref 1.6–2.6)

## 2019-10-07 LAB — CALCIUM, IONIZED: Calcium, Ionized: 2.45 mEq/L (ref 2.20–2.60)

## 2019-10-07 LAB — TSH: TSH: 1.3 u[IU]/mL (ref 0.35–4.94)

## 2019-10-07 MED ORDER — MIDODRINE HCL 5 MG PO TABS
5.0000 mg | ORAL_TABLET | Freq: Three times a day (TID) | ORAL | Status: DC
Start: 2019-10-07 — End: 2019-10-09
  Administered 2019-10-08: 21:00:00 5 mg via NASOGASTRIC
  Filled 2019-10-07 (×2): qty 1

## 2019-10-07 MED ORDER — MODAFINIL 200 MG PO TABS
100.0000 mg | ORAL_TABLET | Freq: Every day | ORAL | Status: DC
Start: 2019-10-08 — End: 2019-10-10
  Administered 2019-10-08 – 2019-10-10 (×3): 100 mg via OROGASTRIC
  Filled 2019-10-07 (×3): qty 1

## 2019-10-07 MED ORDER — GLUCAGON 1 MG IJ SOLR (WRAP)
1.00 mg | INTRAMUSCULAR | Status: DC | PRN
Start: 2019-10-07 — End: 2019-10-07

## 2019-10-07 MED ORDER — GLUCOSE 40 % PO GEL
15.00 g | ORAL | Status: DC | PRN
Start: 2019-10-07 — End: 2019-10-07

## 2019-10-07 MED ORDER — SODIUM CHLORIDE 0.9 % IV SOLN
250.00 mg | Freq: Two times a day (BID) | INTRAVENOUS | Status: DC
Start: 2019-10-07 — End: 2019-10-10
  Administered 2019-10-07 – 2019-10-09 (×5): 250 mg via INTRAVENOUS
  Filled 2019-10-07 (×9): qty 2.5

## 2019-10-07 MED ORDER — INSULIN LISPRO 100 UNIT/ML SC SOLN
1.00 [IU] | Freq: Three times a day (TID) | SUBCUTANEOUS | Status: DC
Start: 2019-10-07 — End: 2019-10-10
  Administered 2019-10-08: 13:00:00 1 [IU] via SUBCUTANEOUS
  Administered 2019-10-10: 08:00:00 2 [IU] via SUBCUTANEOUS
  Filled 2019-10-07 (×2): qty 6

## 2019-10-07 MED ORDER — ENOXAPARIN SODIUM 30 MG/0.3ML SC SOLN
30.00 mg | SUBCUTANEOUS | Status: DC
Start: 2019-10-07 — End: 2019-10-08
  Administered 2019-10-07 – 2019-10-08 (×2): 30 mg via SUBCUTANEOUS
  Filled 2019-10-07 (×2): qty 0.3

## 2019-10-07 MED ORDER — ATORVASTATIN CALCIUM 20 MG PO TABS
40.0000 mg | ORAL_TABLET | Freq: Every day | ORAL | Status: DC
Start: 2019-10-07 — End: 2019-10-07

## 2019-10-07 MED ORDER — ATORVASTATIN CALCIUM 20 MG PO TABS
40.0000 mg | ORAL_TABLET | Freq: Every day | ORAL | Status: DC
Start: 2019-10-07 — End: 2019-10-10
  Administered 2019-10-08 – 2019-10-10 (×3): 40 mg via ORAL
  Filled 2019-10-07 (×3): qty 2

## 2019-10-07 MED ORDER — FERROUS SULFATE 324 (65 FE) MG PO TBEC
324.00 mg | DELAYED_RELEASE_TABLET | Freq: Every morning | ORAL | Status: DC
Start: 2019-10-07 — End: 2019-10-10
  Administered 2019-10-07 – 2019-10-10 (×4): 324 mg via ORAL
  Filled 2019-10-07 (×4): qty 1

## 2019-10-07 MED ORDER — INSULIN LISPRO 100 UNIT/ML SC SOLN
1.00 [IU] | Freq: Every evening | SUBCUTANEOUS | Status: DC
Start: 2019-10-07 — End: 2019-10-10

## 2019-10-07 MED ORDER — ASPIRIN 81 MG PO CHEW
81.00 mg | CHEWABLE_TABLET | Freq: Every day | ORAL | Status: DC
Start: 2019-10-07 — End: 2019-10-10
  Administered 2019-10-08 – 2019-10-10 (×3): 81 mg via ORAL
  Filled 2019-10-07 (×3): qty 1

## 2019-10-07 MED ORDER — SODIUM CHLORIDE 0.9 % IV MBP
4.50 g | Freq: Three times a day (TID) | INTRAVENOUS | Status: DC
Start: 2019-10-07 — End: 2019-10-10
  Administered 2019-10-07 – 2019-10-10 (×9): 4.5 g via INTRAVENOUS
  Filled 2019-10-07 (×9): qty 20

## 2019-10-07 MED ORDER — DEXTROSE 50 % IV SOLN
12.50 g | INTRAVENOUS | Status: DC | PRN
Start: 2019-10-07 — End: 2019-10-07

## 2019-10-07 NOTE — Progress Notes (Signed)
[facility]- Critical Care Note     ICU Daily Progress Note        Date Time: 10/07/19 2:24 PM  Patient Name: Keith Gates  Attending Physician: Kirstie Mirza I, MD  Room: Z610/R604-V   Admit Date: 09/30/2019  LOS: 7 days            Assessment:     Patient Active Problem List   Diagnosis    Non-STEMI (non-ST elevated myocardial infarction)    DKA (diabetic ketoacidoses)    Heart block    Cardiac arrest    Iron deficiency anemia, unspecified iron deficiency anemia type     History of coronary artery disease/CABG.  Ischemic cardiomyopathy.  Diabetes.  Hypertension.  Presented with bradycardic, complete heart block cardiac arrest.  ACLS led to ROSC, required temporary pacemaker placement on admission, status post Arctic sun therapeutic hypothermia treatment,  Good neurologic recovery, extubated 12/19, doing well.  Appears intact neurologically.  Passing swallow evaluation.  Out of bed to chair.  -borderline hypoglycemia in the 60s.  Lantus discontinued.  Anemia.  Hemoglobin 8.1.  Acute kidney injury on presentation, creatinine improving.  Plan:   Continue medical therapy.  Added baby aspirin.  Ischemia evaluation to follow.  [facility] to assess with his Mikael Spray primary physicians to decide work-up and disposition.  Advance diet.  Insulin sliding scale.  Monitor blood counts.  Transfuse packed red blood cells if further decrease in hemoglobin.  We will transfer patient to intermediate care unit for further treatment.      OTHER:  Glycemic Control. Glucose stabilizer per ICU protocol when on insulin drip. Maintain blood glucose 140-180.   Replace electrolytes per ICU electrolyte replacement protocol  Ventilator bundle & Sedation protocol followed. Daily morning sedation holiday, assessment for readiness for SBT & weaning from ventilator; and then re-titrate if required. Aim to keep peak plateau pressure 25-30cm H2O in ARDS patient. Cass tube to suction at 20-30 cm H2O, Maintain Cass tube  with 5-35ml air every 4 hours. Chlorhexidine mouth washes and routine oral care every 4 hours. Stress ulcer and DVT prophylaxis. HOB >=30 degree elevation all the time.  HOB >=30 degree elevation all the time. Aggressive pulmonary toileting. Incentive spirometry when appropriate. Aspiration precautions.     Quality Care: Stress ulcer prophylaxis, DVT prophylaxis, HOB elevated, Infection control all reviewed and addressed.  Events and notes from last 24 hours reviewed. Care plan discussed with nursing.       CC TIME: >50 min         Subjective:   Responsive no acute distress answers questions appropriately.    Medications:       Scheduled Meds: PRN Meds:    aspirin, 81 mg, Oral, Daily  atorvastatin, 40 mg, Oral, Daily  carboxymethylcellulose (PF), 1 drop, Both Eyes, Q8H  chlorhexidine, 15 mL, Mouth/Throat, Q12H SCH  docusate, 100 mg, Oral, Daily  insulin lispro, 1-3 Units, Subcutaneous, QHS  insulin lispro, 1-5 Units, Subcutaneous, TID AC  levETIRAcetam, 250 mg, Intravenous, Q12H SCH  midodrine, 5 mg, per NG tube, Q8H  modafinil, 200 mg, per OG tube, Daily  piperacillin-tazobactam, 4.5 g, Intravenous, Q8H        Continuous Infusions:   sodium chloride, , PRN  dextrose, 15 g of glucose, PRN    And  dextrose, 12.5 g, PRN              Physical Exam:     Vitals:    10/07/19 0900 10/07/19 1000 10/07/19 1100 10/07/19 1200  BP: 130/65 134/84 137/68 124/64   Pulse: 80 85 81 86   Resp: 20 (!) 23 21 19    Temp:  98.6 F (37 C)     TempSrc:  Core     SpO2:  98% 99% 93%   Weight: 79.3 kg (174 lb 13.2 oz)      Height:         Temp (24hrs), Avg:98.6 F (37 C), Min:97.8 F (36.6 C), Max:99.3 F (37.4 C)           12/20 0701 - 12/21 0700  In: 817.4 [I.V.:752.4]  Out: 1380 [Urine:1380]       General Appearance: Comfortable respirations, no acute distress  Mental status: Alert responsive  Neuro: Grossly nonfocal  H & N: No JVD no thrush  Lungs: Few basilar crackles.  Clear upper lobes.  Cardiac: Regular normal sounds no  murmurs  Abdomen: Soft depressible.  Extremities: Trace edema  Skin: No rash      Data:   Nasal cannula oxygen    Labs:     Recent CBC   Recent Labs   Lab 10/07/19  0346 10/06/19  0329 10/05/19  0338   WBC 7.54 6.89 6.53   RBC 2.82* 3.08* 2.58*   Hgb 8.1* 8.7* 7.4*   Hematocrit 24.2* 26.1* 22.6*   MCV 85.8 84.7 87.6   Platelets 108* 100* 85*       Recent Labs   Lab 10/07/19  0346 10/06/19  0329 10/05/19  0338 10/04/19  0412  10/02/19  1012 10/02/19  0422 10/01/19  2305  10/01/19  0903  10/01/19  0253   Sodium 144 140 138 140  More results in Results Review 143 143 142  More results in Results Review 137  --  139   Potassium 4.1 4.1 4.0 4.0  More results in Results Review 4.4 4.5 4.3  More results in Results Review 5.4*  --  6.9*   Chloride 112* 109 108 108  More results in Results Review 104 105 106  More results in Results Review 106  --  109   CO2 22 21* 21* 21*  More results in Results Review 26 25 24   More results in Results Review 18*  --  13*   Glucose 79 176* 239* 180*  More results in Results Review 143* 172* 170*  More results in Results Review 311*  --  349*   BUN 41.2* 42.7* 38.7* 37.6*  More results in Results Review 47.3* 52.8* 58.5*  More results in Results Review 71.0*  --  77.5*   Creatinine 1.6* 1.8* 1.8* 1.8*  More results in Results Review 2.2* 2.5* 2.6*  More results in Results Review 3.1*  --  3.5*   Magnesium 1.8 1.8 2.1 1.8  More results in Results Review 1.9 1.6 2.0  More results in Results Review 1.7  --  1.9   Phosphorus 2.3 2.5 2.9 1.7*  More results in Results Review 2.7 2.8 2.7  More results in Results Review 1.3*  --  2.8   AST (SGOT) 48*  --  33 43*  More results in Results Review 79* 77* 54*  --   --   --   --    ALT 53  --  38 47  More results in Results Review 85* 82* 62*  --   --   --   --    Alkaline Phosphatase 177*  --  138* 110*  More results in Results Review 93 87 83  --   --   --   --  Bilirubin, Total 0.4  --  0.3 0.4  More results in Results Review 0.6 0.6 0.6  --   --    --   --    B-Natriuretic Peptide  --   --   --   --   --   --  499.8*  --   --   --   --   --    PT  --   --   --   --   --  14.1 14.7 15.3*  More results in Results Review 15.0  More results in Results Review  --    PT INR  --   --   --   --   --  1.1 1.2* 1.2*  More results in Results Review 1.2*  More results in Results Review  --    Troponin I  --   --   --   --   --   --   --   --   --  0.93*  --  1.09*   More results in Results Review = values in this interval not displayed.           Rads:     Radiology Results (24 Hour)     ** No results found for the last 24 hours. **          Radiological Imaging personally reviewed.    I have personally reviewed the patients history and 24 hour interval events, along with vitals, labs, radiology images and  02 settings and additional findings found in detail within ICU team notes, with their care plans developed with and reviewed by me.     Time spent in patient evaluation and treatment in critical care excluding procedures, and not overlapping any other providers:  55   minutes.    Signed by: Durward Fortes, MD  Date/Time: 10/07/19 2:24 PM

## 2019-10-07 NOTE — Progress Notes (Signed)
Infectious Disease            Progress Note    10/07/2019   Keith Gates GEX:[id number] is a 63 y.o. male, history of diabetes mellitus, hypertension, hyperlipidemia, stroke, coronary artery disease status post coronary artery bypass grafting who was admitted after cardiac arrest, CPR, with sepsis, UTI, possible pneumonia, aspiration.    Subjective:     Keith Gates today Symptoms: Afebrile, transferred to [facility] awake and responsive. Other review of system is non contributory.    Objective:     Blood pressure 134/84, pulse 85, temperature 98.6 F (37 C), temperature source Core, resp. rate (!) 23, height 1.803 m (5\' 11" ), weight 81.3 kg (179 lb 3.7 oz), SpO2 98 %.    General Appearance:  Awake and responsive, still weak  HEENT: Pallor positive, Anicteric sclera.   Neck: Supple  Lungs:Decreased breath sound at bases.   Chest Wall: Symmetric chest wall expansion.   Heart : S1 and S2.   Abdomen: Abdomen is soft, bowel sounds positive.  Neurological:  Awake and responsive, moves all extremities    Laboratory And Diagnostic Studies:     Recent Labs     10/07/19  0346 10/06/19  0329   WBC 7.54 6.89   Hgb 8.1* 8.7*   Hematocrit 24.2* 26.1*   Platelets 108* 100*   Neutrophils 61.7 63.3     Recent Labs     10/07/19  0346 10/06/19  0329   Sodium 144 140   Potassium 4.1 4.1   Chloride 112* 109   CO2 22 21*   BUN 41.2* 42.7*   Creatinine 1.6* 1.8*   Glucose 79 176*   Calcium 8.4* 8.0*     Recent Labs     10/07/19  0346 10/06/19  0329 10/05/19  0338   AST (SGOT) 48*  --  33   ALT 53  --  38   Alkaline Phosphatase 177*  --  138*   Protein, Total 5.6*  --  5.6*   Albumin 2.1* 2.0* 2.0*   Bilirubin, Total 0.4  --  0.3       Current Med's:     Current Facility-Administered Medications   Medication Dose Route Frequency    aspirin  81 mg Oral Daily    atorvastatin  40 mg Oral Daily    carboxymethylcellulose (PF)  1 drop Both Eyes Q8H    chlorhexidine  15 mL Mouth/Throat Q12H SCH    docusate  100 mg Oral  Daily    insulin glargine  10 Units Subcutaneous QHS    insulin glargine  10 Units Subcutaneous Daily    insulin lispro  1-3 Units Subcutaneous QHS    insulin lispro  1-5 Units Subcutaneous TID AC    levETIRAcetam  250 mg Intravenous Q12H SCH    midodrine  5 mg per NG tube Q8H    modafinil  200 mg per OG tube Daily    piperacillin-tazobactam  4.5 g Intravenous Q8H    sodium chloride  1,000 mL Intravenous Once       Lines/Drains:     Patient Lines/Drains/Airways Status    Active Lines, Drains and Airways     Name:   Placement date:   Placement time:   Site:   Days:    CVC Triple Lumen 09/30/19 Left Internal jugular   09/30/19    2240    Internal jugular   6    Peripheral IV 09/30/19 18 G Anterior;Distal;Left Upper Arm   09/30/19  1342    Upper Arm   6    Peripheral IV 09/30/19 20 G Distal;Posterior;Right Forearm   09/30/19    1444    Forearm   6    Urethral Catheter Double-lumen 16 Fr.   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 Grammatical errors, random word insertions, deletions, pronoun errors and incomplete sentences are occasional consequences of this technology due to software limitations. Not  all errors are caught or corrected. If there are questions or concerns about the content of this note or information contained within the body of this dictation they should be addressed directly with the author for clarification

## 2019-10-07 NOTE — Progress Notes (Addendum)
[facility]  PROGRESS NOTE  [facility]    Date Time: 10/07/19 2:33 PM  Patient Name: Keith Gates, Keith Gates       Patient Active Problem List   Diagnosis    Non-STEMI (non-ST elevated myocardial infarction)    DKA (diabetic ketoacidoses)    Heart block    Cardiac arrest    Iron deficiency anemia, unspecified iron deficiency anemia type       Assessment:      Cardiac arrest, cause not clear, with subsequent complete heart block in the setting of metabolic derangements, pyuria, ARF, leukocytosis, septic shock, PNA   Temporary Pacing wire inserted 09/30/19 by Dr.Lee, removed 10/03/19   Per EP, as pacing support was no longer required, low suspicion that bradyarrhythmia or heart block/primary arrhythmia was cause of arrest   S/p s/p arctic sun therpeutic hypothermia protocol with good neuro recovery, extubated 10/05/19   CAD status post CABG 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 Was not on ACE or BB PTA, currently on Midodrine   Pt is + 7L since admission, he is volume up, may eventually need diuretic esp given his cardiomyopathy, seems to be auto diuresing-need to watch volume closely with improvement in renal function. Would not give additional IV fluids      Medications:      Scheduled Meds: PRN Meds:    aspirin, 81 mg, Oral, Daily  atorvastatin, 40 mg, Oral, Daily  carboxymethylcellulose (PF), 1 drop, Both Eyes, Q8H  chlorhexidine, 15 mL, Mouth/Throat, Q12H SCH  docusate, 100 mg, Oral, Daily  insulin lispro, 1-3 Units,  Subcutaneous, QHS  insulin lispro, 1-5 Units, Subcutaneous, TID AC  levETIRAcetam, 250 mg, Intravenous, Q12H SCH  midodrine, 5 mg, per NG tube, Q8H  [START ON 10/08/2019] modafinil, 100 mg, per OG tube, Daily  piperacillin-tazobactam, 4.5 g, Intravenous, Q8H        Continuous Infusions:   sodium chloride, , PRN  dextrose, 15 g of glucose, PRN    And  dextrose, 12.5 g, PRN              Subjective:   Denies chest pain, SOB or palpitations. Pt does not recall events preceding his arrest       Physical Exam:     Vitals:    10/07/19 1200   BP: 124/64   Pulse: 86   Resp: 19   Temp:    SpO2: 93%     Temp (24hrs), Avg:98.6 F (37 C), Min:97.8 F (36.6 C), Max:99.3 F (37.4 C)      Telemetry reviewed, SR     Intake and Output Summary (Last 24 hours) at Date Time    Intake/Output Summary (Last 24 hours) at 10/07/2019 1433  Last data filed at 10/07/2019 1115  Gross per 24 hour   Intake 703 ml   Output 1355 ml   Net -652 ml       General Appearance:  Breathing comfortable,  no acute distress  Head:  normocephalic  Eyes:  EOM's intact  Neck:  No jugular venous distension  Lungs:  Clear to auscultation throughout, no wheezes, rhonchi or rales, good respiratory effort   Chest Wall:  Erythema epigastric region, mid-sternal chest scar well healed  Heart:  S1, S2 normal, no S3, no S4, no murmur, PMI not displaced, no rub   Abdomen:  Soft, non-tender, positive bowel sounds  Extremities:  General anasarca  Pulses:  Equal radial pulses, 4/4 symmetric  Neurologic:  Alert and oriented x3, somewhat delayed motor responses  Musculoskeletal: generalized weakness    Labs:     Recent Labs   Lab 10/01/19  0903 10/01/19  0253   Troponin I 0.93* 1.09*         Recent Labs   Lab 10/04/19  0412   Triglycerides 84     Recent Labs   Lab 10/07/19  0346   Bilirubin, Total 0.4   Protein, Total 5.6*   Albumin 2.1*   ALT 53   AST (SGOT) 48*     Recent Labs   Lab 10/07/19  0346   Magnesium 1.8     Recent Labs   Lab 10/02/19  1012   PT 14.1   PT INR 1.1      Recent Labs   Lab 10/07/19  0346 10/06/19  0329 10/05/19  0338   WBC 7.54 6.89 6.53   Hgb 8.1* 8.7* 7.4*   Hematocrit 24.2* 26.1* 22.6*   Platelets 108* 100* 85*     Recent Labs   Lab 10/07/19  0346 10/06/19  0329 10/05/19  0338   Sodium 144 140 138   Potassium 4.1 4.1 4.0   Chloride 112* 109 108   CO2 22 21* 21*   BUN 41.2* 42.7* 38.7*   Creatinine 1.6* 1.8* 1.8*   EGFR 44.1 38.5 38.5   Glucose 79 176* 239*   Calcium 8.4* 8.0* 7.9*           Invalid input(s): FREET4    .  Lab Results   Component Value Date    BNP 499.8 (H) 10/02/2019      Estimated Creatinine Clearance: 51.6 mL/min (A) (based on SCr of 1.6 mg/dL (H)).    Weight Monitoring 10/01/2019 10/02/2019 10/03/2019 10/04/2019 10/05/2019 10/06/2019 10/07/2019   Height - - - - - - -   Height Method - - - - - - -   Weight 76.4 kg 80.3 kg 80.7 kg 80.8 kg 83.5 kg 81.3 kg 79.3 kg   Weight Method Bed Scale Bed Scale Bed Scale Bed Scale Bed Scale Bed Scale Bed Scale   BMI (calculated) - - - - - - -         Imaging:   Radiological Procedure reviewed.              Signed by: Cindee Lame, NP    Patient seen and examined, my assessment and plans as noted above.    Signed by    Encarnacion Slates, MD      [facility]  NP Spectral[employer] [phone] (8am-5pm)  MD [employer] [phone] (8am-5pm)  After hours, non urgent consult line [phone]  After Hours, urgent consults or to reach the on-call MD [phone]

## 2019-10-07 NOTE — Progress Notes (Signed)
Situation Patient was admitted to the hospital with the dx of Cardiac Arrest    Extubated on 10/06/19 and transferred to the [facility] on 10/07/19     Background Hx of NSTEMI, HTN, stroke, CABG    Has Manual Wheel chair     Assessment Lives with wife, needs assistance with ADLs and his wife helps him at home, uses a rolling walker to ambulate with, has Mikael Spray PCP and insurance.     Recommendation PT/OT is ordered and discharge needs depend on their recommendations.    Plan is to go home vs SNF.      Case Manager talked to Sammuel Bailiff with [facility] at [phone] and she said no bed available at the [facility] facility for transfer.  This information was discussed with Dr. Lesle Reek.  Cm will follow up as needed.         10/07/19 1707   Patient Type   Within 30 Days of Previous Admission? No   Healthcare Decisions   Interviewed: Spouse;Patient   Name of interviewee if other than the pt: Donavon Gerhold, wife, [phone] cell   Interviewee Contact Information: Noa Laskin, wife, [phone] cell   Orientation/Decision Making Abilities of Patient Other (coment)  (patient was able to provide information to the Case Manage and wife was present in the room)   Advance Directive Patient has advance directive, copy not in chart   Advance Directive not in Chart Copy requested from family/decision maker   Healthcare Agent Appointed No   (RETIRED) Healthcare Agent's Name Angelo Burris, wife, [phone] cell   (RETIRED) Healthcare Agent's Phone Number Jayleon Kotz, wife, [phone] cell   Additional Emergency Contacts? Johnlee Matsuyama, daughter, [phone]   Prior to admission   Prior level of function Needs assistance with ADLs;Ambulates with assistive device   Type of Residence Private residence   Home Layout Multi-level;Able to live on main level with bedroom/bathroom;Stairs to enter without rails (add number in comment)   Have running water, electricity, heat, etc? Yes   Living Arrangements Spouse/significant other    How do you get to your MD appointments? wife   How do you get your groceries? wife   Who fixes your meals? wife   Who does your laundry? wife   Who picks up your prescriptions? wife   Dressing Needs assistance   Grooming Needs assistance   Feeding Independent   Bathing Needs assistance   Toileting Needs assistance   DME Currently at Home ADL- Grab Bars;ADL- Soil scientist, [employer];Wheelchair, Manual   Name of Prior Assisted Living Facility n/a   Home Care/Community Services None   Prior SNF admission? (Detail) [facility]   Prior Rehab admission? (Detail) none   Adult Protective Services (APS) involved? No   Discharge Planning   Support Systems Spouse/significant other;Children   Patient expects to be discharged to: home vs SNF   Anticipated [place] plan discussed with: Same as interviewed;Patient;Spouse   [place] discussion contact information: Rashed Hapner, wife, [phone] cell   Potential barriers to discharge: Testing/procedure   Mode of transportation: Other  (depends on the mobility evaluation)   Does the patient have perscription coverage? Yes   Consults/Providers   PT Evaluation Needed 1   OT Evalulation Needed 1   SLP Evaluation Needed 1   Outcome Palliative Care Screen Screened but did not meet criteria for intervention   Correct PCP listed in Epic? Yes   Family and PCP   In case you are admitted, would like family notified? Yes   Name of  family member to be notified Eban Bonkowski, wife, [phone] cell or daughter, Aileen Pilot   In case you are admitted, would like your PCP notified? Yes   PCP on file was verified as the current PCP? Yes   Important Message from Medicare Notice   Patient received 1st IMM Letter? n/a     Jonetta Osgood, RN MSN ACM  Clinical Case Manager II  [phone]

## 2019-10-07 NOTE — Progress Notes (Signed)
[facility]) Progress Note        Date / Time: 12/21/202:38 PM Room:   W311/W311-A  Patient:   Keith Gates, Keith Gates   Admitted: 09/30/2019  Attending:   Elvera Lennox, MD   Status: Full Code     Critical Care PROGRESS Note --  Hospital Day /  LOS: 7 days           Assessment    Kaylib Streff is a 63 y.o. male with cardiac arrest    Patient Active Problem List    Diagnosis Date Noted    Iron deficiency anemia, unspecified iron deficiency anemia type 10/03/2019    Heart block 09/30/2019    Cardiac arrest 09/30/2019    DKA (diabetic ketoacidoses) 08/14/2018    Non-STEMI (non-ST elevated myocardial infarction) 01/22/2018     Last 24 Hrs     Transferred to [facility]    Hospital Course     10/07/2019: transferred to [facility]    Plan     Critical Care support by organ system:    NEURO: s/p cardiac arrest: patient has had remarkable neurologic recovery following TTM. He is awake and alert though speech is slow. Will continue Provigil and Keppra    CV:   Cardiac Arrest: await echo. Will require eventual cardiac cath and possibly EP studies.  CAD: history of CABG. Echo from 01/2018 showed EF 20-25% with apical thrombus and pulmonary HTN  Hypotension: on midodrine    PULM: Oxygenation excellent on minimal supplemental oxygen     GI: patient has passed swallow evaluation. Will begin pureed diet  GI VHQ:IONG  Nutrition: pureed diet    RENAL: AKI: patient has had remarkable improvement in renal function but appears to have baseline CKD.    ID:   Serratia marcescens pneumonia/ UTI due to ? Organisom: on Zosyn    HEME: H/H stable  SCD's in place.   Pharmacologic ppx: is safe    ENDO/RHEUM:   DM: on accuchecks  TSH: will check    Lines/Foley:   Triple lumen catheter Centrally delivered medications / fluids  Foley:will D/C    Code Status: Full Code  Patient is currently is able to make medical decisions.      Patients designated proxy is not present.    Discussed current diagnoses,  prognosis and goals of care.     Disposition: [facility]    Consultants: cardiology, neurology, ID      Physical Exam   BP 124/64    Pulse 86    Temp 98.6 F (37 C) (Core)    Resp 19    Ht 1.803 m (5\' 11" )    Wt 79.3 kg (174 lb 13.2 oz)    SpO2 93%    BMI 24.38 kg/m    General Appearance:  chronically ill appearing  Mental status:  Awake and alert  Neuro:  neck supple without rigidity  Lungs: clear to auscultation, no wheezes, rales or rhonchi, symmetric air entry  Cardiac: normal rate, regular rhythm, normal S1, S2, no murmurs, rubs, clicks or gallops  Abdomen:  soft, nontender, nondistended, no masses or organomegaly  Extremities: peripheral pulses normal, no pedal edema, no clubbing or cyanosis   Skin:   normal coloration and turgor, no rashes, no suspicious skin lesions noted  Other:       Data / Meds / Labs / Rads     Current Facility-Administered Medications   Medication Dose Route Frequency    aspirin  81 mg Oral Daily    atorvastatin  40 mg Oral Daily    carboxymethylcellulose (PF)  1 drop Both Eyes Q8H    chlorhexidine  15 mL Mouth/Throat Q12H SCH    docusate  100 mg Oral Daily    insulin lispro  1-3 Units Subcutaneous QHS    insulin lispro  1-5 Units Subcutaneous TID AC    levETIRAcetam  250 mg Intravenous Q12H SCH    midodrine  5 mg per NG tube Q8H    [START ON 10/08/2019] modafinil  100 mg per OG tube Daily    piperacillin-tazobactam  4.5 g Intravenous Q8H          Intake/Output Summary (Last 24 hours) at 10/07/2019 1438  Last data filed at 10/07/2019 1115  Gross per 24 hour   Intake 703 ml   Output 1355 ml   Net -652 ml     Recent Labs     10/07/19  0346 10/06/19  0329 10/05/19  0338   WBC 7.54 6.89 6.53   Hgb 8.1* 8.7* 7.4*   Hematocrit 24.2* 26.1* 22.6*     Recent Labs     10/07/19  0346 10/06/19  0329 10/05/19  0338   Sodium 144 140 138   Potassium 4.1 4.1 4.0   Chloride 112* 109 108   CO2 22 21* 21*   BUN 41.2* 42.7* 38.7*   Creatinine 1.6* 1.8* 1.8*   Glucose 79 176* 239*   Calcium 8.4* 8.0*  7.9*   Magnesium 1.8 1.8 2.1   Phosphorus 2.3 2.5 2.9     No results for input(s): PT, INR, PTT in the last 72 hours.  XR Chest AP Portable   Final Result         1. Left IJ approach central venous catheter with tip likely in the left   innominate vein.   2. Slightly increasing right lung base opacities compared to prior.      Jorge Mandril, MD    10/06/2019 9:23 AM      CT Head WO Contrast   Final Result       1. No acute intracranial abnormality is identified.   2. Chronic ischemic changes.      Nicoletta Dress, MD    10/03/2019 6:28 PM      US Renal Kidney Bladder Complete   Final Result    No hydronephrosis.      Rocky Crafts, MD    10/02/2019 10:13 PM      XR Chest AP Portable   Final Result         Lines in good position. Hypoventilation with left basilar atelectasis.   No significant consolidation or pneumothorax.      Charlott Rakes, MD    09/30/2019 10:56 PM      Chest AP Portable   Final Result    Bilateral infiltrates suspicious for pulmonary edema.                  Will Bonnet, MD    09/30/2019 3:26 PM      Temporary Pacer Insertion    (Results Pending)   Echocardiogram Adult Complete W Color Doppler Waveform    (Results Pending)   XR Chest AP Portable    (Results Pending)     _____________________________________________________________________    This patient has a high probability of sudden clinically significant deterioration which requires the highest level of physician preparedness to intervene urgently. I managed/supervised life or organ supporting interventions that required frequent  physician assessments. I devoted my full attention in the ICU to the direct care of this patient for this period of time.  Organ systems that require intensive critical care support  are described in more detail in the note assessment and plan above.     Any critical care time was performed today and is exclusive of teaching, billable procedures, and not overlapping with any other providers.     Critical care time:  35 minutes.    Signed by: Elvera Lennox  10/07/2019 2:38 PM    ZO:[id number], No Family (Inactive)

## 2019-10-07 NOTE — PT Progress Note (Signed)
Physical Therapy Note    [facility]  [address]  [place], [place]. [address]    Department of Rehabilitation  [phone]    Physical Therapy Daily Treatment Note    Patient: Keith Gates    MRN#: [id number]     W311/W311-A    Time of Treatment: Start Time: 0927 Stop Time: 1012 Time Calculation (min): 45 min    PT Visit Number: 2    Patients medical condition is appropriate for Physical Therapy intervention at this time.    Precautions and Contraindications:  Precautions  Other Precautions: Falls     Assessment: Patient now extubated. Able to transfer OOB to chair, but current requires max A due to below impairments. Patient with baseline L sided weakness due to prior CVA. Patient at high risk for falls and will continue to benefit from d/c to SNF prior to returning home.   Assessment: Decreased LE strength;Decreased endurance/activity tolerance;Decreased functional mobility;Decreased balance;Gait impairment Prognosis: Good;With continued PT status post acute discharge   Progress: Progressing toward goals     Patient Goal: get better       Plan:   Continue with Physical Therapy services to address LE strengthening, transfers, gait, balance, endurance. Focus next session on standing balance, transfers.  Treatment/Interventions: Exercise;Gait training;Neuromuscular re-education;Functional transfer training;LE strengthening/ROM;Endurance training;Patient/family training;Bed mobility;Continued evaluation   PT Frequency: 3-4x/wk     Based on today's session patient's discharge recommendation is the following: Discharge Recommendation: SNF   DME Recommended for Discharge: (TBD at SNF)    If Discharge Recommendation: SNF is not available, then the patient will need home health services, increase supervision , 24/7 supervision, assistance with mobility, assistance with ADL's and assistance with IADL's.DME needs if primary discharge not available - Rolling walker , BSC , W/C , hoyer lift  and hospital  bed             Subjective: Patient is agreeable to participation in the therapy session. Nursing clears patient for therapy.   Pain Assessment  Pain Assessment: No/denies pain     Objective:  Observation of Patient/Vital Signs:  Patient is in bed with ICU monitors, L IJ, 2L O2 via NC, foley in place.    Cognition/Neuro Status  Arousal/Alertness: Appropriate responses to stimuli  Attention Span: Appears intact  Orientation Level: Oriented to place;Oriented to time;Oriented to person  Memory: Appears intact  Following Commands: Follows one step commands without difficulty  Safety Awareness: minimal verbal instruction  Insights: Decreased awareness of deficits  Problem Solving: minimal assistance    Functional Mobility  Rolling: Moderate Assist  Supine to Sit: Moderate Assist;Maximal Assist  Sit to Stand: Maximal Assist  Stand to Sit: Maximal Assist  Transfers  Bed to Chair: Maximal Assist   Therapeutic exercises  Performed supine in bed  SLR: 10 reps  Hip abduction: 10 reps   Heel slides: 10 reps  Short Arc Quad: 10 reps  Ankle pumps: 20 reps   Patient instructed in the above exercises with a focus on pacing between exercises. Advised patient to complete established home exercise plan at least twice a day to increase generalized strength, endurance, and to facilitate increased independence with mobility and ADLs.                                       Treatment Activities: Required more assistance to complete exercises on LLE>RLE due to baseline deficits. Upon sitting EOB,  no c/o dizziness or lightheadedness, VSS. Educated patient in the importance of monitoring dizziness before and with movement especially first thing in the morning or after a nap. Instructed patient to sit at EOB for about 60 seconds when first going from supine to sit to check for dizziness or lightheadedness,  then to stand and remain in place for about 60 seconds again looking for signs and symptoms of dizziness or lightheadedness. Advised  patient to complete ankle pumps or knee squeezes to assist with blood circulation if needed. Educated patient if dizziness persists, then to remain seated.  Pt educated on importance of maintaining strength and endurance while in house by ambulating with RN staff and use of RW, as well as eating all meals OOB and minimizing time in bed; pt agreeable. Discussed benefits of OOB activity to minimize risk for DVT, PNA and pressure sores. Pt educated on importance of call bell, especially when getting OOB or out of chair.  Pt left sitting in chair with all needs met, call bell, phone, and bedside table next to patient.         Educated the patient to role of physical therapy, plan of care, goals of therapy and HEP, safety with mobility and ADLs.    Patient left without needs and call bell within reach.  RN notified of session outcome.        Therapist PPE during session procedural mask, face shield  and gloves      Jorge Mandril, PT, DPT

## 2019-10-07 NOTE — Progress Notes (Incomplete)
[facility]- Critical Care Note     ICU Daily Progress Note        Date Time: 10/07/19 2:19 PM  Patient Name: Keith Gates  Attending Physician: Kirstie Mirza I, MD  Room: Z610/R604-V   Admit Date: 09/30/2019  LOS: 7 days            Assessment:     Patient Active Problem List   Diagnosis    Non-STEMI (non-ST elevated myocardial infarction)    DKA (diabetic ketoacidoses)    Heart block    Cardiac arrest    Iron deficiency anemia, unspecified iron deficiency anemia type     Status post cardiac arrest with bradycardia/complete heart block on presentation,  Status post ACLS/ROSC, temporary pacemaker placement, Arctic sun therapeutic hypothermia treatment.  Good neurologic recovery.  Extubated.    Plan:         OTHER:  Glycemic Control. Glucose stabilizer per ICU protocol when on insulin drip. Maintain blood glucose 140-180.   Replace electrolytes per ICU electrolyte replacement protocol  Ventilator bundle & Sedation protocol followed. Daily morning sedation holiday, assessment for readiness for SBT & weaning from ventilator; and then re-titrate if required. Aim to keep peak plateau pressure 25-30cm H2O in ARDS patient. Cass tube to suction at 20-30 cm H2O, Maintain Cass tube with 5-20ml air every 4 hours. Chlorhexidine mouth washes and routine oral care every 4 hours. Stress ulcer and DVT prophylaxis. HOB >=30 degree elevation all the time.  HOB >=30 degree elevation all the time. Aggressive pulmonary toileting. Incentive spirometry when appropriate. Aspiration precautions.     Quality Care: Stress ulcer prophylaxis, DVT prophylaxis, HOB elevated, Infection control all reviewed and addressed.  Events and notes from last 24 hours reviewed. Care plan discussed with nursing.       CC TIME: >50 min         Subjective:   ***    Medications:       Scheduled Meds: PRN Meds:    aspirin, 81 mg, Oral, Daily  atorvastatin, 40 mg, Oral, Daily  carboxymethylcellulose (PF), 1 drop, Both Eyes,  Q8H  chlorhexidine, 15 mL, Mouth/Throat, Q12H SCH  docusate, 100 mg, Oral, Daily  insulin lispro, 1-3 Units, Subcutaneous, QHS  insulin lispro, 1-5 Units, Subcutaneous, TID AC  levETIRAcetam, 250 mg, Intravenous, Q12H SCH  midodrine, 5 mg, per NG tube, Q8H  modafinil, 200 mg, per OG tube, Daily  piperacillin-tazobactam, 4.5 g, Intravenous, Q8H        Continuous Infusions:   sodium chloride, , PRN  dextrose, 15 g of glucose, PRN    And  dextrose, 12.5 g, PRN              Physical Exam:     Vitals:    10/07/19 0900 10/07/19 1000 10/07/19 1100 10/07/19 1200   BP: 130/65 134/84 137/68 124/64   Pulse: 80 85 81 86   Resp: 20 (!) 23 21 19    Temp:  98.6 F (37 C)     TempSrc:  Core     SpO2:  98% 99% 93%   Weight: 79.3 kg (174 lb 13.2 oz)      Height:         Temp (24hrs), Avg:98.6 F (37 C), Min:97.8 F (36.6 C), Max:99.3 F (37.4 C)           12/20 0701 - 12/21 0700  In: 817.4 [I.V.:752.4]  Out: 1380 [Urine:1380]       General Appearance:   Mental status:  Neuro:    H & N:   Lungs:   Cardiac:   Abdomen:    Extremities:   Skin:       Data:       Vent Settings:    Vent Settings  Vent Mode: (S) PS/CPAP  FiO2: 30 %  Resp Rate (Set): 12  Vt (Set, mL): 450 mL  PIP Observed (cm H2O): 15 cm H2O  PEEP/EPAP: (S) 5 cm H20  Pressure Support / IPAP: (S) 10 cmH20  Mean Airway Pressure: 8 cmH20      Labs:     Recent CBC   Recent Labs   Lab 10/07/19  0346 10/06/19  0329 10/05/19  0338   WBC 7.54 6.89 6.53   RBC 2.82* 3.08* 2.58*   Hgb 8.1* 8.7* 7.4*   Hematocrit 24.2* 26.1* 22.6*   MCV 85.8 84.7 87.6   Platelets 108* 100* 85*       Recent Labs   Lab 10/07/19  0346 10/06/19  0329 10/05/19  0338 10/04/19  0412  10/02/19  1012 10/02/19  0422 10/01/19  2305  10/01/19  0903  10/01/19  0253   Sodium 144 140 138 140  More results in Results Review 143 143 142  More results in Results Review 137  --  139   Potassium 4.1 4.1 4.0 4.0  More results in Results Review 4.4 4.5 4.3  More results in Results Review 5.4*  --  6.9*   Chloride 112* 109  108 108  More results in Results Review 104 105 106  More results in Results Review 106  --  109   CO2 22 21* 21* 21*  More results in Results Review 26 25 24   More results in Results Review 18*  --  13*   Glucose 79 176* 239* 180*  More results in Results Review 143* 172* 170*  More results in Results Review 311*  --  349*   BUN 41.2* 42.7* 38.7* 37.6*  More results in Results Review 47.3* 52.8* 58.5*  More results in Results Review 71.0*  --  77.5*   Creatinine 1.6* 1.8* 1.8* 1.8*  More results in Results Review 2.2* 2.5* 2.6*  More results in Results Review 3.1*  --  3.5*   Magnesium 1.8 1.8 2.1 1.8  More results in Results Review 1.9 1.6 2.0  More results in Results Review 1.7  --  1.9   Phosphorus 2.3 2.5 2.9 1.7*  More results in Results Review 2.7 2.8 2.7  More results in Results Review 1.3*  --  2.8   AST (SGOT) 48*  --  33 43*  More results in Results Review 79* 77* 54*  --   --   --   --    ALT 53  --  38 47  More results in Results Review 85* 82* 62*  --   --   --   --    Alkaline Phosphatase 177*  --  138* 110*  More results in Results Review 93 87 83  --   --   --   --    Bilirubin, Total 0.4  --  0.3 0.4  More results in Results Review 0.6 0.6 0.6  --   --   --   --    B-Natriuretic Peptide  --   --   --   --   --   --  499.8*  --   --   --   --   --  PT  --   --   --   --   --  14.1 14.7 15.3*  More results in Results Review 15.0  More results in Results Review  --    PT INR  --   --   --   --   --  1.1 1.2* 1.2*  More results in Results Review 1.2*  More results in Results Review  --    Troponin I  --   --   --   --   --   --   --   --   --  0.93*  --  1.09*   More results in Results Review = values in this interval not displayed.           Rads:     Radiology Results (24 Hour)     ** No results found for the last 24 hours. **          Radiological Imaging personally reviewed.    I have personally reviewed the patients history and 24 hour interval events, along with vitals, labs, radiology images  and  ventilator settings and additional findings found in detail within ICU team notes, with their care plans developed with and reviewed by me.     Time spent in patient evaluation and treatment in critical care excluding procedures, and not overlapping any other providers:     minutes.    Signed by: Durward Fortes, MD  Date/Time: 10/07/19 2:19 PM

## 2019-10-07 NOTE — Plan of Care (Signed)
Received pt at 1pm. Pt is A&Ox4, able to move BUEs and BLEs are able to over gravity. Bmx1 small loose brownish BM. CVC flushed, patent. Foley removed at 5pm, to observe for voiding. Blanchable redness noted at sacral, cleaned at covered with mepilex. Abrasion noted at sternum area, a small ulcer at the sternum area, dressing is intact, abrasion is dry and exposed to air. Echo is a/w. On pureed diet now.     Problem: Safety  Goal: Patient will be free from injury during hospitalization  10/07/2019 1639 by Stephanie Coup, RN  Flowsheets (Taken 10/07/2019 1617)  Patient will be free from injury during hospitalization:   Assess patient's risk for falls and implement fall prevention plan of care per policy   Provide and maintain safe environment   Use appropriate transfer methods   Ensure appropriate safety devices are available at the bedside   Include patient/ family/ care giver in decisions related to safety   Hourly rounding   Assess for patients risk for elopement and implement Elopement Risk Plan per policy  10/07/2019 1617 by Stephanie Coup, RN  Flowsheets (Taken 10/07/2019 1617)  Patient will be free from injury during hospitalization:   Assess patient's risk for falls and implement fall prevention plan of care per policy   Provide and maintain safe environment   Use appropriate transfer methods   Ensure appropriate safety devices are available at the bedside   Include patient/ family/ care giver in decisions related to safety   Hourly rounding   Assess for patients risk for elopement and implement Elopement Risk Plan per policy     Problem: Compromised Tissue integrity  Goal: Damaged tissue is healing and protected  Flowsheets (Taken 10/07/2019 1639)  Damaged tissue is healing and protected:   Monitor/assess Braden scale every shift   Reposition patient every 2 hours and as needed unless able to reposition self   Increase activity as tolerated/progressive mobility   Provide wound care per wound care algorithm    Relieve pressure to bony prominences for patients at moderate and high risk   Avoid shearing injuries   Use bath wipes, not soap and water, for daily bathing   Keep intact skin clean and dry   Use incontinence wipes for cleaning urine, stool and caustic drainage. Foley care as needed   Monitor external devices/tubes for correct placement to prevent pressure, friction and shearing   Encourage use of lotion/moisturizer on skin   Monitor patient's hygiene practices     Problem: Safety  Goal: Patient will be free from injury during hospitalization  10/07/2019 1639 by Stephanie Coup, RN  Flowsheets (Taken 10/07/2019 1617)  Patient will be free from injury during hospitalization:   Assess patient's risk for falls and implement fall prevention plan of care per policy   Provide and maintain safe environment   Use appropriate transfer methods   Ensure appropriate safety devices are available at the bedside   Include patient/ family/ care giver in decisions related to safety   Hourly rounding   Assess for patients risk for elopement and implement Elopement Risk Plan per policy  10/07/2019 1617 by Stephanie Coup, RN  Flowsheets (Taken 10/07/2019 1617)  Patient will be free from injury during hospitalization:   Assess patient's risk for falls and implement fall prevention plan of care per policy   Provide and maintain safe environment   Use appropriate transfer methods   Ensure appropriate safety devices are available at the bedside   Include patient/ family/ care giver in  decisions related to safety   Hourly rounding   Assess for patients risk for elopement and implement Elopement Risk Plan per policy     Problem: Compromised Tissue integrity  Goal: Damaged tissue is healing and protected  Flowsheets (Taken 10/07/2019 1639)  Damaged tissue is healing and protected:   Monitor/assess Braden scale every shift   Reposition patient every 2 hours and as needed unless able to reposition self   Increase activity as tolerated/progressive  mobility   Provide wound care per wound care algorithm   Relieve pressure to bony prominences for patients at moderate and high risk   Avoid shearing injuries   Use bath wipes, not soap and water, for daily bathing   Keep intact skin clean and dry   Use incontinence wipes for cleaning urine, stool and caustic drainage. Foley care as needed   Monitor external devices/tubes for correct placement to prevent pressure, friction and shearing   Encourage use of lotion/moisturizer on skin   Monitor patient's hygiene practices

## 2019-10-07 NOTE — SLP Progress Note (Signed)
Speech Language Pathology    [facility]  [address]  [place], [place]. [address]    Department of Rehabilitation Services  [phone]    Cognitive Linguistic and Dysphagia Treatment Note    Patient: Keith Gates    MRN#: [id number]     Time of Treatment: SLP Received On: 10/07/19  Start Time: 1430  Stop Time: 1515  Time Calculation (min): 45 min    SLP Visit Number: 2    Assessment:   Patient seen for BID   Patient transferred to [facility], wife present for session and participated   Patient presents with mild STM deficits, between sessions Patient did recall information from one to the other, he demonstrated association techniques as SLP had instructed at eval  Patient presents with at least mild oral impairments for bolus control and swallow requiring extra time to manage orally, inconsistent delay of swallow with delayed cough  Can not r/o aspiration bedside  Based on current diagnosis and PMH dysphagia recommend MBSS to objectively assess swallow and safest PO diet     Plan/ Recommendations:   Start an oral diet, therapeutic feeding with SLP, MBSS to r/o aspiration, dysphagia treatment, patient/family education  Follow up treatments: oral motor exercises, pharyngeal exercises, thermal stimulation, diet monitoring, strategies training  SLP Frequency Recommended: 3-4x/wk  Next Visit Recommended: 10/08/2019 (consider MBSS if appropriate to participate)  Duration of Treatment: through acute care stay   Diet Solids Recommendation: Dysphagia Puree  Liquids Recommendation: Nectar Thick   Precautions/Compensations: Awake/alert, Upright 90 degrees for all oral intake, 45 degrees upright after meals, Alternate solids and liquids, Dentures, Small bites/sips, Eat/feed slowly, Oral hygiene assist  Recommended Form of Meds: PO, whole with puree  Suggestions for Feeding: 1:1 supervision, needs assist  Recommendations: Defer to PT/OT recommendation  Recommendation Discussed With: Patient, Physician, Nurse,  Dietitian, Family, Posted bedside  Referral(s): Physiatrist     Goals:  Patient/caregiver will independently verbalize S/S of aspiration, aspiration precautions, feeding techniques for safe PO in 7 sessions  Max cues needed    Patient will demonstrate no S/S of overt aspiration with nectar thick liquids in 5 sessions  Recommend start with nectar thick liquids due to reduced bolus control and to decrease aspiration precautions    Patient will demonstrate no S/S of overt aspiration with puree dfet in 5 sessions  Recommend start with puree diet    Patient will demonstrate adequate oropharyngeal phase skills for mechanically altered in 7 sessions  DNT    Patient will participate in MBSS objectively assess oral and pharyngeal stages of swallow  D/W Patient, wife, MD; Patient states he has MBSS while in rehab after surgery    Patient will demonstrate compensatory strategies during oral intake using: controlled bolus size, with moderate cues, to facility safe/effective swallowing, double swallows  Max cues neeed    Patient will demonstrate STM and delayed recall >=80% in 7 sessions  Delayed recall 2/3 over 90 minutes    Patient will demonstrate effective problem solving >=80% in 7 sessions     Discharge Recommendations:   Recommendations: Defer to PT/OT recommendation, consider Acute Rehab, Patient anticipated to benefit from and to be able to engage in 3 hours of therapy a day for 5 days a week.     Subjective:   Patient is agreeable to participation in the therapy session. Family agreeable to patient's participation in the therapy session. Nursing clears patient for therapy. Patients medical condition is appropriate for Speech therapy intervention at this time.  Objective:   Observation of Patient/Vital Signs: Patient is in bed with telemetry, SCD's, central line and indwelling urinary catheter in place.  Current Status  Respiratory Status: room air  Date extubated: 10/06/19  Time extubated: 1500  Days intubated:  (7)  Behavior/Mental Status: Awake/alert, Able to follow directions, Cooperative, Pleasant mood, Flat affect  Nutrition: NPO      Ross Information Processing Assessment 2 RIPA 2 Quantifies cognitive-linguistic deficits, determines severity levels for each skill area and develop rehabilitation goals and objectives. Provides quantifiable data for profiling 10 key areas basic to communicative and cognitive functioning.  Immediate Memory 23/30  Recent Memory 22/30  Temporal Orientation (Recent Memory) DNT  Temporal Orientation (Remote Memory) DNT  Spatial Orientation DNT  Orientation to Environment DNT  Recall of General Information 25/30  Problem Solving and Abstract Reasoning 23/30  Organization 25/30  Auditory Processing and Retention 24/30     Patient stimuable for cueing techniques to repeat, rehearse, review, associations to assist with delayed recall. Restless at times and perseverative Larena Sox, moving sheets and blankets). He was friendly and appropriate. Patient appropriate, but notable flat affect     Oral Assessment  Mucous Membranes: Pink and moist with firm gums  Oral Comfort: Comfortable  Lips/Corners of Mouth: Smooth/pink/moist  Tongue: Pink and moist  Saliva/Dry Mouth: WNL  Dentition: Dentures top, Dentures bottom, Edentulous (wife bought dentures in, she states he typically did not wear PTA     Oral Motor Skills  Oral Motor Skills: exceptions to [facility]  Oral Motor Impairments: coordination   Voice hoarse, speech relatively slow, but intelligible  Deglutition Skills  Position: upright 90 degrees, SLP fed, but Patient tried to feed himself, hands R>L edema with reduced grip strength  Food(s) Tested: ice chips 3, thin liquid by cup and straw, nectar thick liquid by cup, puree x 4 oz over 2 sessions  Oral Stage: bolus formation/control reduced, slow but effective, suspect AP propulsion reduced, chewing reduced, slow but effective, residuals  Oral Stage Residuals: Lingual, needed extra time to propel and  swallow and clear residue  Lingual Residuals: Cleared, Lingual sweep, Spontaneous swallow, Prompted swallow with extra time  Pharyngeal Stage: Suspect delayed response  Esophageal Stage: DNT     Treatment Activities and Patient/Family Education: SLP educated patient and family re: role of SLP for swallowing evaluation and treatment plan, importance of oral hygiene, aspiration precautions, and feeding techniques to decrease risk of aspiration. SLP explained role for cognitive language treatment as well. Reinforcement needed for both patient and his wife.      Therapist PPE during session procedural mask, goggles, and gloves      Left with call bell in reach and needs met. Results d/w patient, DTR, wife, MD, and RN.      Bernardino Dowell L. Granville Lewis M.S. CCC/SLP, CBIS  Speech Language Pathologist  [facility]  [phone]

## 2019-10-07 NOTE — SLP Eval Note (Signed)
[facility]  [address]  [place], [place]. [address]    Department of Rehabilitation Services  [phone]    SLP and Bedside Swallow Evaluations     Patient: Keith Gates    MRN#: [id number]     Time of Treatment:   1324-4010 total time 54 min    Consult received for Keith Gates for SLP Bedside Swallow Evaluation and Treatment.    Assessment:   Patient presents with moderate oral and pharyngeal dysphagia with low tone, tongue thrust, prolonged oral stage and delay of swallow reflex with increased risk of bolus loss to pharynx   Patient presents with mild cognitive linguistic impairments as evidenced by reduced STM, reduced problem solving, extra processing time needed    Plan/ Recommendations:   Start an oral diet, therapeutic feeding with SLP, MBSS to r/o aspiration, dysphagia treatment, patient/family education  Follow up treatments: oral motor exercises, pharyngeal exercises, thermal stimulation, diet monitoring, strategies training  SLP Frequency Recommended: 3-4x/wk  Next Visit Recommended: 10/08/2019  Duration of Treatment: through acute care stay   Diet Solids Recommendation: Dysphagia Puree  Liquids Recommendation: Nectar Thick   Precautions/Compensations: Awake/alert, Upright 90 degrees for all oral intake, 45 degrees upright after meals, Alternate solids and liquids, Dentures, Small bites/sips, Eat/feed slowly, Oral hygiene assist  Recommended Form of Meds: PO, whole with puree  Suggestions for Feeding: 1:1 supervision, needs assist  Recommendations: Defer to PT/OT recommendation  Recommendation Discussed With: Patient, Physician, Nurse, Dietitian, Family, Posted bedside  Referral(s): Physiatrist    Goals:  Patient/caregiver will independently verbalize S/S of aspiration, aspiration precautions, feeding techniques for safe PO in 7 sessions  Patient will demonstrate no S/S of overt aspiration with nectar thick liquids in 5 sessions  Patient will demonstrate no S/S of overt  aspiration with puree dfet in 5 sessions  Patient will demonstrate adequate oropharyngeal phase skills for mechanically altered in 7 sessions  Patient will participate in MBSS objectively assess oral and pharyngeal stages of swallow  Patient will demonstrate compensatory strategies during oral intake using: controlled bolus size, with moderate cues, to facility safe/effective swallowing, double swallows  Patient will demonstrate STM and delayed recall >=80% in 7 sessions  Patient will demonstrate effective problem solving >=80% in 7 sessions    Discharge Recommendations:   Recommendations: Defer to PT/OT recommendation, consider Acute Rehab, Patient anticipated to benefit from and to be able to engage in 3 hours of therapy a day for 5 days a week.     Medical Diagnosis: Cardiac arrest [I46.9]  Heart block [I45.9]  History of Present Illness: Keith Gates is a 63 y.o. male admitted on 09/30/2019 with  "Keith Gates is a 63 y.o. 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 The patient presented to the hospital after cardiac arrest. History was obtained from the wife over the telephone using translator. Reportedly the patient had shortness of breath today. No complaints of chest pain. When she checked on him around midday he was weak and unarousable in his bed. There was no trauma. EMS was called. They found the patient on well minimally responsive, bradycardic, then asystolic. CPR/ACLS was done for about 6 minutes. ROSC was obtained.  Patient was intubated. In the emergency department the patient again had cardiac arrest with PEA and bradycardia. Complete heart block was noted.  2 bouts of CPR were done. Epinephrine was started. Subsequently patient was taken to the vascular lab where temporary intravenous  pacemaker was placed with good capture. He is now on dobutamine and epinephrine infusion admitted to intensive care unit.  He is unresponsive." per H&P    Social History:  Prior Level of Function  Prior level of function: Ambulates independently, Needs assistance with ADLs  Baseline Activity Level: No independent activity  DME Currently at Home: [id number], Single [place], [place], [employer]  Home Living Arrangements  Living Arrangements: Spouse, 2 children (24 and 16)  Type of Home: House  Home Layout: Multi-level, Able to live on main level with bedroom/bathroom, Stairs to enter with rails 2  Bathroom Shower/Tub: Tub/shower unit  DME Currently at Home: [place], [employer], [place], [employer]  Home Living Notes / Comments: Per daughter, patient relying on spouse to assist with ADLs and not walking much PTA    Patient Active Problem List   Diagnosis    Non-STEMI (non-ST elevated myocardial infarction)    DKA (diabetic ketoacidoses)    Heart block    Cardiac arrest    Iron deficiency anemia, unspecified iron deficiency anemia type      Past Medical/Surgical History:  Past Medical History:   Diagnosis Date    Diabetes mellitus     Elevated cholesterol     Hypertension     Stroke       Past Surgical History:   Procedure Laterality Date    CORONARY ARTERY BYPASS GRAFT  01/2018    TEMPORARY PACER INSERTION N/A 09/30/2019    Procedure: TEMPORARY PACER INSERTION;  Surgeon: Acie Fredrickson, MD;  Location: [facility] CARDIAC CATH/EP;  Service: Cardiovascular;  Laterality: N/A;       History  Speech Therapy History: History of CVA, SLP and rehab at "[facility]" after surgery, DTR reports at least mild cognitive impairments; wife, DTR, Patient report that he was in "rehab" after CABG in July 2020 received SLP swallow and cognition treatment; on a regular diet and thin liquids PTA    Subjective:   Patient is agreeable to participation in the therapy session. Family agreeable to patient's participation in the therapy session. Nursing clears patient for therapy. Patients medical condition is appropriate for Speech therapy intervention at this  time.    Objective:   Observation of Patient/Vital Signs: Patient is in bed with telemetry, SCD's, central line and indwelling urinary catheter in place.  Current Status  Respiratory Status: room air  Date extubated: 10/06/19  Time extubated: 1500  Days intubated: (7)  Behavior/Mental Status: Awake/alert, Able to follow directions, Cooperative, Pleasant mood  Nutrition: NPO     Ross Information Processing Assessment 2 RIPA 2 Quantifies cognitive-linguistic deficits, determines severity levels for each skill area and develop rehabilitation goals and objectives. Provides quantifiable data for profiling 10 key areas basic to communicative and cognitive functioning.  Immediate Memory 23/30  Recent Memory 22/30  Temporal Orientation (Recent Memory) DNT  Temporal Orientation (Remote Memory) DNT  Spatial Orientation DNT  Orientation to Environment DNT  Recall of General Information 25/30  Problem Solving and Abstract Reasoning 23/30  Organization 25/30  Auditory Processing and Retention 24/30    Patient stimuable for cueing techniques to repeat, rehearse, review, associations to assist with delayed recall. Restless at times and perseverative Larena Sox, moving sheets and blankets). He was friendly and appropriate. Patient appropriate, but notable flat affect    Oral Assessment  Mucous Membranes: Pink and moist with firm gums  Oral Comfort: Comfortable  Lips/Corners of Mouth: Smooth/pink/moist  Tongue: Pink and moist  Saliva/Dry Mouth: WNL  Dentition: Dentures top, Dentures  bottom, Edentulous    Oral Motor Skills  Oral Motor Skills: exceptions to [facility]  Oral Motor Impairments: coordination   Voice hoarse, speech relatively slow, but intelligible  Deglutition Skills  Position: upright 90 degrees, SLP fed, but Patient tried to feed himself, hands R>L edema with reduced grip strength  Food(s) Tested: ice chips 3, thin liquid by cup and straw, nectar thick liquid by cup, puree x 4 oz over 2 sessions  Oral Stage: bolus  formation/control reduced, slow but effective, suspect AP propulsion reduced, chewing reduced, slow but effective, residuals  Oral Stage Residuals: Lingual, needed extra time to propel and swallow and clear residue  Lingual Residuals: Cleared, Lingual sweep, Spontaneous swallow, Prompted swallow with extra time  Pharyngeal Stage: Suspect delayed response  Esophageal Stage: DNT    Treatment Activities and Patient/Family Education: SLP educated patient and family re: role of SLP for swallowing evaluation and treatment plan, importance of oral hygiene, aspiration precautions, and feeding techniques to decrease risk of aspiration.      Therapist PPE during session procedural mask, goggles, and gloves     Left with call bell in reach and needs met. Results d/w patient, DTR, wife, MD, and RN.     Cassey Bacigalupo L. Granville Lewis M.S. CCC/SLP, CBIS  Speech Language Pathologist  [facility]  [phone]    10/07/2019

## 2019-10-07 NOTE — Progress Notes (Signed)
Nutritional Support Services  Brief Follow-up Note    Keith Gates 63 y.o. male   MRN: [id number]    Summary of Nutrition Plan of Care & Recommendations:  1) PO diet as per SLP- current order is Pureed with thin liquids, no added salt  2) Trial of Ensure HP BID  3) Consider daily MVI with minerals to meet micronutrient needs    -----------------------------------------------------------------------------------------------------------------  Chart reviewed and events noted.  Pt previously on tube feeds; now s/p extubation yesterday.  Transferred to [facility] this afternoon and initiated on Pureed diet with thin liquids after SLP eval today.  Will trial nutritional supplements and monitor po intake for adequacy.     Maurice Small MS RD  Clinical Dietitian  [facility] [phone]  Office: [phone]

## 2019-10-07 NOTE — Plan of Care (Signed)
OVERNIGHT      2L nc s/p extubation, one bout of sleep apnea noted in the high 80s      Q4 Accu Checks with SSI   --Running low, gave amp dextrose last night at 2300 for bg at 64. "64" again shortly after Am labs, inquired with EICU, ordered to continue to follow hypoglucemia protocol.     EKG done last night (ST alarm for lead I and AvL)   ---Trauma NP reviewed    Midodrine held last night, NPO s/p extubation with pending speech eval, sbp in the 120s-140s

## 2019-10-07 NOTE — UM Notes (Signed)
inp ref# [id number]    [facility] Utilization Review  NPI # [id number]  Tax ID [id number]  Please call Vaughn Beaumier at [phone], [phone], or e-mail at [email or web address] with any questions or concerns.   Fax final authorization and request for additional information to [phone].    CSR 10/07/19 in [place], transferred from ICU    Extubated 12/20    VS: Temp 98.6, HR 80, RR 23, BP 130/65, SpO2 98% on RA    Abn labs  H/H 8.1 & 24.2  Platelets 108  BUN 41.2  Creat 1.6  Chloride 112  Calcium 8.4  AST 48  Alk Phos 177      Current medications  Scheduled Meds:  Current Facility-Administered Medications   Medication Dose Route Frequency    aspirin  81 mg Oral Daily    atorvastatin  40 mg Oral Daily    carboxymethylcellulose (PF)  1 drop Both Eyes Q8H    chlorhexidine  15 mL Mouth/Throat Q12H SCH    docusate  100 mg Oral Daily    insulin lispro  1-3 Units Subcutaneous QHS    insulin lispro  1-5 Units Subcutaneous TID AC    levETIRAcetam  250 mg Intravenous Q12H SCH    midodrine  5 mg per NG tube Q8H    modafinil  200 mg per OG tube Daily    piperacillin-tazobactam  4.5 g Intravenous Q8H     ICU attending note  History of coronary artery disease/CABG.  Ischemic cardiomyopathy.  Diabetes.  Hypertension.  Presented with bradycardic, complete heart block cardiac arrest.  ACLS led to ROSC, required temporary pacemaker placement on admission, status post Arctic sun therapeutic hypothermia treatment,  Good neurologic recovery, extubated 12/19, doing well.  Appears intact neurologically.  Passing swallow evaluation.  Out of bed to chair.  -borderline hypoglycemia in the 60s.  Lantus discontinued.  Anemia.  Hemoglobin 8.1.  Acute kidney injury on presentation, creatinine improving.  Plan:   Continue medical therapy.  Added baby aspirin.  Ischemia evaluation to follow.  [facility] to assess with his Mikael Spray primary physicians to decide work-up and disposition.  Advance diet.  Insulin  sliding scale.  Monitor blood counts.  Transfuse packed red blood cells if further decrease in hemoglobin.  We will transfer patient to intermediate care unit for further treatment.          Cardiology progress note  Assessment:      Cardiac arrest, cause not clear, with subsequent complete heart block in the setting of metabolic derangements, pyuria, ARF, leukocytosis, septic shock, PNA   Temporary Pacing wire inserted12/14/20by Dr.Lee, removed 10/03/19   Per EP, as pacing support was no longer required, low suspicion that bradyarrhythmia or heart block/primary arrhythmia was cause of arrest   S/p s/p arctic sun therpeutic hypothermia protocol with good neuro recovery, extubated 10/05/19   CAD status post CABG 01/2019 x 4 vessel (details not readily available)   Ischemic CMP with LVEF 20% by echo April 2019   COVID negative   AKI on CKD2, Scr 1.9-> 1.6   COVID negative    Recommendations:    Pt extubated 12/19 now with good Neuro recovery, still somewhat delayed but overall improving   Will repeat echo to reassess LV function, wall motion   Eventual cardiac cath with guidance from Renal, will limit contrast, need to get OP note from recent CABG 01/2019   Cont ASA, statin. Was not on ACE or BB PTA, currently on Midodrine   Pt is +  7L since admission, he is volume up, may eventually need diuretic esp given his cardiomyopathy, seems to be auto diuresing-need to watch volume closely with improvement in renal function. Would not give additional IV fluids

## 2019-10-07 NOTE — Progress Notes (Signed)
[facility]  NEPHROLOGY PROGRESS NOTE    Date Time: 10/07/19 4:03 PM  Patient Name: Keith Gates, Keith Gates  MRN#: [id number]  DOB: 1956-03-28  Requesting Physician: Elvera Lennox, MD      Reason for Consultation:   ARF on CKD II    Assessment:   ARF  CKD II  Hyperkalemia  Acidosis  HypoPO4  Anemia  HTN    Plan:   - Renal: pt with ARF on CKD II. His CKD with proteinuria is due to his underlying DM and HTN. His ARF is due to ATN by his cardiac arrest causing hemodynamic instability. His renal function is improved with an acceptable UOP. I will follow his renal function closely with you. Please avoid nephrotoxins like ACEI/ARB, NSAIDs and contrast as much as possible.    - Hyperkalemia: severe, due to ARF but improving with conservative management. I will follow his levels closely     - Acidosis: due to ARF and lactic acidosis. Improved. Follow levels.    - HypoPO4: he was hyperPO4 on admission which is now resolved. I will follow his levels    - Anemia: I gave him iv venofer for severe iron deficiency. I will start him on oral iron now.    - HTN: BP is improved. Continue his midodrine 5mg  po TID. Will continue the current management and try to maintain MAP>65.       History:   Keith Gates is a 63 y.o. male who presents to the hospital on 09/30/2019 after cardiac arrest requiring 6 minutes of resuscitation on site by EMS. The pt has a h/o controlled DM, controlled HTN and CKD stage II with a baseline sCr in the low 1's and proteinuria. He has also a h/o CAD s/p CABG earlier 2020.   He was apparently c/o generalized weakness and SOB for 1-2 days and then was found unresponsive and bradycardic by his wife on the bed. EMS was called who found him to be bradycardic and he went into asystole. CPR was done for 6 minutes. He regained pulse and was intubated and admitted to the ICU. On admission his sCr was 4.1 and his K peaked at 7.3. His glucose was 392. He was treated medically with with pressors for low  BP and dobutamine drip and also iv bicarb for his acidosis and his sCr is now better at 3.1 and his K was 5.4. His CXR shows clear lungs.     When I saw the pt he was extubated out of the ICU and feeling better. His wife was at bedside. His arms are edematous. Pt denies any CP/SOB/N/V/D/abd pain/fever/chills/cough/sputum/dysuria/hematuria      Past Medical History:     Past Medical History:   Diagnosis Date    Diabetes mellitus     Elevated cholesterol     Hypertension     Stroke        Past Surgical History:     Past Surgical History:   Procedure Laterality Date    CORONARY ARTERY BYPASS GRAFT  01/2018    TEMPORARY PACER INSERTION N/A 09/30/2019    Procedure: TEMPORARY PACER INSERTION;  Surgeon: Acie Fredrickson, MD;  Location: [facility] CARDIAC CATH/EP;  Service: Cardiovascular;  Laterality: N/A;       Family History:   History reviewed. No pertinent family history.    Social History:     Social History     Socioeconomic History    Marital status: Married     Spouse name: Not on  file    Number of children: Not on file    Years of education: Not on file    Highest education level: Not on file   Occupational History    Not on file   Social Needs    Financial resource strain: Not on file    Food insecurity     Worry: Not on file     Inability: Not on file    Transportation needs     Medical: Not on file     Non-medical: Not on file   Tobacco Use    Smoking status: Former Smoker     Years: 30.00     Quit date: 10/17/2002     Years since quitting: 16.9    Smokeless tobacco: Never Used   Substance and Sexual Activity    Alcohol use: Yes    Drug use: No    Sexual activity: Not on file   Lifestyle    Physical activity     Days per week: Not on file     Minutes per session: Not on file    Stress: Not on file   Relationships    Social connections     Talks on phone: Not on file     Gets together: Not on file     Attends religious service: Not on file     Active member of club or organization: Not on file     Attends  meetings of clubs or organizations: Not on file     Relationship status: Not on file    Intimate partner violence     Fear of current or ex partner: Not on file     Emotionally abused: Not on file     Physically abused: Not on file     Forced sexual activity: Not on file   Other Topics Concern    Not on file   Social History Narrative    Not on file       Allergies:   No Known Allergies    Medications:     Current Facility-Administered Medications   Medication Dose Route Frequency    aspirin  81 mg Oral Daily    atorvastatin  40 mg Oral Daily    carboxymethylcellulose (PF)  1 drop Both Eyes Q8H    chlorhexidine  15 mL Mouth/Throat Q12H SCH    docusate  100 mg Oral Daily    enoxaparin  30 mg Subcutaneous Q24H SCH    insulin lispro  1-3 Units Subcutaneous QHS    insulin lispro  1-5 Units Subcutaneous TID AC    levETIRAcetam  250 mg Intravenous Q12H SCH    midodrine  5 mg per NG tube Q8H    [START ON 10/08/2019] modafinil  100 mg per OG tube Daily    piperacillin-tazobactam  4.5 g Intravenous Q8H       Review of Systems:   As per HPI, all other systems were reviewed and were negative.    Physical Exam:     Vitals:    10/07/19 1500   BP: 104/77   Pulse: 95   Resp: 17   Temp:    SpO2: 95%       Intake and Output Summary (Last 24 hours) at Date Time    Intake/Output Summary (Last 24 hours) at 10/07/2019 1603  Last data filed at 10/07/2019 1115  Gross per 24 hour   Intake 689.1 ml   Output 1280 ml   Net -590.9 ml  General appearance - alert, well appearing, and in no distress  Mental status - alert, oriented to person, place, and time  Eyes - pupils equal and reactive, extraocular eye movements intact  Mouth - mucous membranes moist, pharynx normal without lesions  Neck - supple, no significant adenopathy  Lymphatics - no palpable lymphadenopathy, no hepatosplenomegaly  Chest - clear to auscultation, no wheezes, rales or rhonchi, symmetric air entry  Heart - normal rate, regular rhythm, normal S1, S2,  no murmurs, rubs, clicks or gallops  Abdomen - soft, nontender, nondistended, no masses or organomegaly  Neurological - alert, oriented, normal speech, no focal findings or movement disorder noted  Musculoskeletal - no joint tenderness, deformity or swelling  Extremities - peripheral pulses normal, no pedal edema, no clubbing or cyanosis  Skin - normal coloration and turgor, no rashes, no suspicious skin lesions noted    Labs Reviewed:     Results     Procedure Component Value Units Date/Time    TSH [[id number]] Collected: 10/07/19 0346    Specimen: Blood Updated: 10/07/19 1528     TSH 1.30 uIU/mL     Glucose Whole Blood - POCT [[id number]] Collected: 10/07/19 1134     Updated: 10/07/19 1218     Whole Blood Glucose POCT 79 mg/dL     Glucose Whole Blood - POCT [[id number]]  (Abnormal) Collected: 10/07/19 0733     Updated: 10/07/19 0812     Whole Blood Glucose POCT 112 mg/dL     Glucose Whole Blood - POCT [[id number]]  (Abnormal) Collected: 10/07/19 0632     Updated: 10/07/19 0635     Whole Blood Glucose POCT 128 mg/dL     Glucose Whole Blood - POCT [[id number]]  (Abnormal) Collected: 10/07/19 0615     Updated: 10/07/19 0617     Whole Blood Glucose POCT 147 mg/dL     Glucose Whole Blood - POCT [[id number]]  (Abnormal) Collected: 10/07/19 0543     Updated: 10/07/19 0546     Whole Blood Glucose POCT 64 mg/dL     Magnesium [[id number]] Collected: 10/07/19 0346     Updated: 10/07/19 0428     Magnesium 1.8 mg/dL     Comprehensive metabolic panel [[id number]]  (Abnormal) Collected: 10/07/19 0346    Specimen: Blood Updated: 10/07/19 0428     Glucose 79 mg/dL      BUN 30.1 mg/dL      Creatinine 1.6 mg/dL      Sodium 601 mEq/L      Potassium 4.1 mEq/L      Chloride 112 mEq/L      CO2 22 mEq/L      Calcium 8.4 mg/dL      Protein, Total 5.6 g/dL      Albumin 2.1 g/dL      AST (SGOT) 48 U/L      ALT 53 U/L      Alkaline Phosphatase 177 U/L      Bilirubin, Total 0.4 mg/dL      Globulin 3.5 g/dL      Albumin/Globulin Ratio 0.6     Anion  Gap 10.0    GFR [[id number]] Collected: 10/07/19 0346     Updated: 10/07/19 0428     EGFR 44.1    Renal function panel [[id number]] Collected: 10/07/19 0346    Specimen: Blood Updated: 10/07/19 0428     Phosphorus 2.3 mg/dL     Calcium, ionized [[id number]] Collected: 10/07/19 0346    Specimen: Blood Updated: 10/07/19 0406     Calcium,  Ionized 2.45 mEq/L     CBC and differential [[id number]]  (Abnormal) Collected: 10/07/19 0346    Specimen: Blood Updated: 10/07/19 0406     WBC 7.54 x10 3/uL      Hgb 8.1 g/dL      Hematocrit 13.2 %      Platelets 108 x10 3/uL      RBC 2.82 x10 6/uL      MCV 85.8 fL      MCH 28.7 pg      MCHC 33.5 g/dL      RDW 17 %      MPV 11.9 fL      Neutrophils 61.7 %      Lymphocytes Automated 17.2 %      Monocytes 13.1 %      Eosinophils Automated 7.2 %      Basophils Automated 0.4 %      Immature Granulocytes 0.4 %      Nucleated RBC 0.0 /100 WBC      Neutrophils Absolute 4.65 x10 3/uL      Lymphocytes Absolute Automated 1.30 x10 3/uL      Monocytes Absolute Automated 0.99 x10 3/uL      Eosinophils Absolute Automated 0.54 x10 3/uL      Basophils Absolute Automated 0.03 x10 3/uL      Immature Granulocytes Absolute 0.03 x10 3/uL      Absolute NRBC 0.00 x10 3/uL     Glucose Whole Blood - POCT [[id number]] Collected: 10/07/19 0115     Updated: 10/07/19 0117     Whole Blood Glucose POCT 97 mg/dL     Glucose Whole Blood - POCT [[id number]]  (Abnormal) Collected: 10/07/19 0012     Updated: 10/07/19 0014     Whole Blood Glucose POCT 134 mg/dL     Glucose Whole Blood - POCT [[id number]]  (Abnormal) Collected: 10/06/19 2352     Updated: 10/07/19 0003     Whole Blood Glucose POCT 156 mg/dL     Glucose Whole Blood - POCT [[id number]]  (Abnormal) Collected: 10/06/19 2254     Updated: 10/06/19 2348     Whole Blood Glucose POCT 64 mg/dL     Glucose Whole Blood - POCT [[id number]] Collected: 10/06/19 1912     Updated: 10/06/19 1943     Whole Blood Glucose POCT 76 mg/dL               Rads:   Radiological Procedure  reviewed.   No results found.

## 2019-10-07 NOTE — Plan of Care (Signed)
Goals:  Patient/caregiver will independently verbalize S/S of aspiration, aspiration precautions, feeding techniques for safe PO in 7 sessions  Patient will demonstrate no S/S of overt aspiration with nectar thick liquids in 5 sessions  Patient will demonstrate no S/S of overt aspiration with puree dfet in 5 sessions  Patient will demonstrate adequate oropharyngeal phase skills for mechanically altered in 7 sessions  Patient will participate in MBSS objectively assess oral and pharyngeal stages of swallow  Patient will demonstrate compensatory strategies during oral intake using: controlled bolus size, with moderate cues, to facility safe/effective swallowing, double swallows  Patient will demonstrate STM and delayed recall >=80% in 7 sessions  Patient will demonstrate effective problem solving >=80% in 7 sessions

## 2019-10-08 ENCOUNTER — Inpatient Hospital Stay: Payer: PRIVATE HEALTH INSURANCE

## 2019-10-08 LAB — COMPREHENSIVE METABOLIC PANEL
ALT: 57 U/L — ABNORMAL HIGH (ref 0–55)
AST (SGOT): 49 U/L — ABNORMAL HIGH (ref 5–34)
Albumin/Globulin Ratio: 0.6 — ABNORMAL LOW (ref 0.9–2.2)
Albumin: 2.3 g/dL — ABNORMAL LOW (ref 3.5–5.0)
Alkaline Phosphatase: 183 U/L — ABNORMAL HIGH (ref 38–106)
Anion Gap: 9 (ref 5.0–15.0)
BUN: 35.7 mg/dL — ABNORMAL HIGH (ref 9.0–28.0)
Bilirubin, Total: 0.5 mg/dL (ref 0.2–1.2)
CO2: 22 mEq/L (ref 22–29)
Calcium: 8.4 mg/dL — ABNORMAL LOW (ref 8.5–10.5)
Chloride: 113 mEq/L — ABNORMAL HIGH (ref 100–111)
Creatinine: 1.5 mg/dL — ABNORMAL HIGH (ref 0.7–1.3)
Globulin: 3.6 g/dL (ref 2.0–3.6)
Glucose: 144 mg/dL — ABNORMAL HIGH (ref 70–100)
Potassium: 4.4 mEq/L (ref 3.5–5.1)
Protein, Total: 5.9 g/dL — ABNORMAL LOW (ref 6.0–8.3)
Sodium: 144 mEq/L (ref 136–145)

## 2019-10-08 LAB — GLUCOSE WHOLE BLOOD - POCT
Whole Blood Glucose POCT: 118 mg/dL — ABNORMAL HIGH (ref 70–100)
Whole Blood Glucose POCT: 197 mg/dL — ABNORMAL HIGH (ref 70–100)
Whole Blood Glucose POCT: 178 mg/dL — ABNORMAL HIGH (ref 70–100)
Whole Blood Glucose POCT: 169 mg/dL — ABNORMAL HIGH (ref 70–100)

## 2019-10-08 LAB — CBC AND DIFFERENTIAL
Absolute NRBC: 0 10*3/uL (ref 0.00–0.00)
Basophils Absolute Automated: 0.03 10*3/uL (ref 0.00–0.08)
Basophils Automated: 0.4 %
Eosinophils Absolute Automated: 0.53 10*3/uL — ABNORMAL HIGH (ref 0.00–0.44)
Eosinophils Automated: 7.4 %
Hematocrit: 25 % — ABNORMAL LOW (ref 37.6–49.6)
Hgb: 8.2 g/dL — ABNORMAL LOW (ref 12.5–17.1)
Immature Granulocytes Absolute: 0.03 10*3/uL (ref 0.00–0.07)
Immature Granulocytes: 0.4 %
Lymphocytes Absolute Automated: 1.32 10*3/uL (ref 0.42–3.22)
Lymphocytes Automated: 18.4 %
MCH: 28.8 pg (ref 25.1–33.5)
MCHC: 32.8 g/dL (ref 31.5–35.8)
MCV: 87.7 fL (ref 78.0–96.0)
MPV: 11.5 fL (ref 8.9–12.5)
Monocytes Absolute Automated: 0.89 10*3/uL — ABNORMAL HIGH (ref 0.21–0.85)
Monocytes: 12.4 %
Neutrophils Absolute: 4.37 10*3/uL (ref 1.10–6.33)
Neutrophils: 61 %
Nucleated RBC: 0 /100 WBC (ref 0.0–0.0)
Platelets: 125 10*3/uL — ABNORMAL LOW (ref 142–346)
RBC: 2.85 10*6/uL — ABNORMAL LOW (ref 4.20–5.90)
RDW: 16 % — ABNORMAL HIGH (ref 11–15)
WBC: 7.17 10*3/uL (ref 3.10–9.50)

## 2019-10-08 LAB — IRON PROFILE
Iron Saturation: 20 % (ref 15–50)
Iron: 40 ug/dL (ref 40–160)
TIBC: 200 ug/dL — ABNORMAL LOW (ref 261–462)
UIBC: 160 ug/dL (ref 126–382)

## 2019-10-08 LAB — PT/INR
PT INR: 1.1 (ref 0.9–1.1)
PT: 13.6 s (ref 12.6–15.0)

## 2019-10-08 LAB — PHOSPHORUS: Phosphorus: 2 mg/dL — ABNORMAL LOW (ref 2.3–4.7)

## 2019-10-08 LAB — COVID-19 (SARS-COV-2): SARS CoV 2 Overall Result: NEGATIVE

## 2019-10-08 LAB — B-TYPE NATRIURETIC PEPTIDE: B-Natriuretic Peptide: 1474.1 pg/mL — ABNORMAL HIGH (ref 0.0–100.0)

## 2019-10-08 LAB — HEMOLYSIS INDEX: Hemolysis Index: 6 (ref 0–18)

## 2019-10-08 LAB — MAGNESIUM: Magnesium: 1.8 mg/dL (ref 1.6–2.6)

## 2019-10-08 LAB — GFR: EGFR: 47.5

## 2019-10-08 MED ORDER — DEXTROSE 5 % IV SOLN
15.00 mmol | Freq: Once | INTRAVENOUS | Status: AC
Start: 2019-10-08 — End: 2019-10-08
  Administered 2019-10-08: 13:00:00 15 mmol via INTRAVENOUS
  Filled 2019-10-08: qty 5

## 2019-10-08 MED ORDER — ENOXAPARIN SODIUM 40 MG/0.4ML SC SOLN
40.00 mg | SUBCUTANEOUS | Status: DC
Start: 2019-10-09 — End: 2019-10-10
  Administered 2019-10-09: 09:00:00 40 mg via SUBCUTANEOUS
  Filled 2019-10-08: qty 0.4

## 2019-10-08 MED ORDER — FUROSEMIDE 10 MG/ML IJ SOLN
20.00 mg | Freq: Every day | INTRAMUSCULAR | Status: DC
Start: 2019-10-08 — End: 2019-10-09
  Administered 2019-10-08 – 2019-10-09 (×2): 20 mg via INTRAVENOUS
  Filled 2019-10-08 (×2): qty 2

## 2019-10-08 NOTE — PT Progress Note (Signed)
[facility]  [address]  [place], [place]. [address]    Department of Rehabilitation  [phone]    Physical Therapy Daily Treatment Note    Patient: Keith Gates    MRN#: [id number]     [id number]    Time of Treatment: Start Time: 1014 Stop Time: 1032 Time Calculation (min): 18 min    PT Visit Number: 3    Patients medical condition is appropriate for Physical Therapy intervention at this time.    Precautions and Contraindications: fall risk       Assessment: Pt continues to present with below deficits, but demonstrating slight improvements with transfers today. Will benefit from continued PT to address below deficits and return to PLOF.  Assessment: Decreased LE strength;Decreased endurance/activity tolerance;Decreased functional mobility;Gait impairment;Decreased balance Prognosis: With continued PT status post acute discharge   Progress: Progressing toward goals       Plan:   Continue with Physical Therapy services to address mobility deficits. Focus next session on therex, transfers, ambulation as appropriate.  Treatment/Interventions: Gait training;Neuromuscular re-education;Functional transfer training;LE strengthening/ROM;Endurance training;Patient/family training;Bed mobility   PT Frequency: 3-4x/wk     Based on today's session patient's discharge recommendation is the following: SNF          Subjective: Patient is agreeable to participation in the therapy session. Nursing clears patient for therapy. No c/o pain currently.          Objective:  Observation of Patient/Vital Signs:  Patient is in bed with telemetry and peripheral IV in place.    Cognition/Neuro Status  Arousal/Alertness: Appropriate responses to stimuli  Attention Span: Appears intact  Following Commands: Follows one step commands without difficulty  Safety Awareness: minimal verbal instruction  Behavior: attentive;calm;cooperative    Functional Mobility  Supine to Sit: Maximal Assist;HOB raised  Sit to Stand: Maximal  Assist  Stand to Sit: Maximal Assist  Transfers  Bed to Chair: Maximal Assist(stand stepping/pivot with UE support)       Neuro Re-Ed  Sitting Balance: minimal assist with UE support at EOB. x4-5 min prior to OOB transfer     Treatment Activities: Mobility as above. Noted pt wet when PT entered room. Assisted with pericare, gown change and OOB transfer as above. Second standing trial from chair for additional pericare. Max A for standing balance with UE support, total A for pericare. Encouraged to sit OOB as tolerated for pulmonary hygiene. Encouraged to perform LE therex throughout the day to decrease effects of immobility. Pt verbalized understanding for all education provided.    Educated the patient to role of physical therapy, plan of care, goals of therapy and HEP, safety with mobility and ADLs.    At end of session pt reclined in chair, call bell and items in reach. RN aware. Chair alarm on.      Therapist PPE during session procedural mask, goggles  and gloves       Delfin Edis, PT, DPT  Pager #: [phone]

## 2019-10-08 NOTE — OT Eval Note (Signed)
 [facility]  [address]  [place], [place]. [address]    Department of Rehabilitation Services  [phone]    Occupational Therapy Evaluation    Patient: Keith Gates    MRN#: [id number]     [id number]    Time of treatment: Time Calculation  OT Received On: 10/08/19  Start Time: 1014  Stop Time: 1033  Time Calculation (min): 19 min  OT Visit Number: 1    Consult received for Keith Gates for OT Evaluation and Treatment.  Patient's medical condition is appropriate for Occupational therapy intervention at this time.    Assessment:   Keith Gates is a 63 y.o. male admitted 09/30/2019.   Brief chart review completed including review of labs, review of imaging, review of vitals, ICU stay,review of H&P and physician progress notes and review of consulting physician notes .  Pt's ability to complete ADLs and functional transfers is impaired due to the following deficits:  decreased activity tolerance, decreased balance, decreased safety awareness, decreased strength and transfers .  Pt demonstrates performance deficits with grooming, bathing, dressing, toileting and functional mobility. There are a few comorbidities or other factors that affect plan of care and require modification of task including: assistive device needed for mobility and residual neurological symptoms.  Pt would continue to benefit from OT to address these deficits and increase functional independence.      Assessment: decreased strength;balance deficits;decreased independence with ADLs;decreased safety awareness;decreased endurance/activity tolerance     Complexity Chart Review Performance Deficits Clinical Decision Making Hx/Comorbidities Assistance needed   Low Brief 1-3 Limited options None None (or at baseline)   Moderate Expanded 3-5 Several Options 1-2 Min/Mod assist (not at baseline)   High Extensive 5 or more Multiple options 3 or more Max/dependent (not at baseline     Therapy Diagnosis: generalized weakness,  decreased functional mobility  and decreased independence with ADL's due to s/p cardiac arrest. Without therapy interventions, patient is at risk for falls and decreased independence.    Rehabilitation Potential: Prognosis: Fair;With continued OT s/p acute discharge      Plan:   OT Frequency Recommended: 2-3x/wk   Treatment Interventions: ADL retraining;Functional transfer training;Endurance training;UE strengthening/ROM;Patient/Family training;Neuro muscular reeducation          Risks/Benefits/POC Discussed with Pt/Family: With patient    Goals:   Goal Formulation: Patient  Time For Goal Achievement: by time of discharge  ADL Goals  Patient will groom self: Supervision;at edge of bed;5 visits  Mobility and Transfer Goals  Pt will transfer bed to [facility]: Moderate Assist;with rolling walker;5 visits  Neuro Re-Ed Goals  Pt will sit at edge of bed: Supervision;for 5 minutes;to prepare for OOB tasks;5 visits                      Discharge Recommendations:   Based on today's session patient's discharge recommendation is the following: Discharge Recommendation: SNF.                 Precautions and Contraindications: Falls Risk          Medical Diagnosis: Cardiac arrest [I46.9]  Heart block [I45.9]    History of Present Illness: Keith Gates is a 63 y.o. male admitted on 09/30/2019."The patient presented to the hospital after cardiac arrest. History was obtained from the wife over the telephone using translator. Reportedly the patient had shortness of breath today. No complaints of chest pain. When she checked on him around midday he was weak  and unarousable in his bed. There was no trauma. EMS was called. They found the patient on well minimally responsive, bradycardic, then asystolic. CPR/ACLS was done for about 6 minutes. ROSC was obtained. Patient was intubated. In the emergency department the patient again had cardiac arrest with PEA and bradycardia. Complete heart block was noted. 2 bouts of CPR were done.  Epinephrine was started. Subsequently patient was taken to the vascular lab where temporary intravenous pacemaker was placed with good capture. He is now on dobutamine and epinephrine infusion admitted to intensive care unit. He is unresponsive."--as per H & P note.       Patient Active Problem List   Diagnosis   . Non-STEMI (non-ST elevated myocardial infarction)   . DKA (diabetic ketoacidoses)   . Heart block   . Cardiac arrest   . Iron deficiency anemia, unspecified iron deficiency anemia type        Past Medical/Surgical History:  Past Medical History:   Diagnosis Date   . Diabetes mellitus    . Elevated cholesterol    . Hypertension    . Stroke       Past Surgical History:   Procedure Laterality Date   . CORONARY ARTERY BYPASS GRAFT  01/2018   . TEMPORARY PACER INSERTION N/A 09/30/2019    Procedure: TEMPORARY PACER INSERTION;  Surgeon: Keith Fredrickson, MD;  Location: [facility] CARDIAC CATH/EP;  Service: Cardiovascular;  Laterality: N/A;         X-Rays/Tests/Labs:  Ct Head Wo Contrast    Result Date: 10/03/2019   1. No acute intracranial abnormality is identified. 2. Chronic ischemic changes. Keith Dress, MD  10/03/2019 6:28 PM    US Renal Kidney Bladder Complete    Result Date: 10/02/2019   No hydronephrosis. Keith Crafts, MD  10/02/2019 10:13 PM    Xr Chest Ap Portable    Result Date: 10/08/2019   Diminished bibasilar atelectasis. Keith Solian, MD  10/08/2019 8:00 AM    Xr Chest Ap Portable    Result Date: 10/06/2019  1. Left IJ approach central venous catheter with tip likely in the left innominate vein. 2. Slightly increasing right lung base opacities compared to prior. Keith Mandril, MD  10/06/2019 9:23 AM        Social History:  Prior Level of Function  Prior level of function: Needs assistance with ADLs, Ambulates with assistive device  Baseline Activity Level: No independent activity  DME Currently at Home: ADL- Binnie Rail, ADL- Paediatric nurse, Environmental consultant, [employer], Biomedical scientist, Manual  Home Living  Arrangements  Living Arrangements: Spouse/significant other  Type of Home: House  Home Layout: Multi-level, Able to live on main level with bedroom/bathroom, Stairs to enter without rails (add number in comment)  Bathroom Shower/Tub: Tub/shower unit  DME Currently at Home: ADL- Engineer, materials, ADL- Paediatric nurse, Environmental consultant, [employer], Wheelchair, Manual  Home Living - Notes / Comments: Per daughter, patient relying on spouse to assist with ADLs and not walking much PTA      Subjective:   Patient is agreeable to participation in the therapy session. Nursing clears patient for therapy.  Subjective: Patient reporting he needed to use the urinal upon arrival. Patient incontinent of urine in bed at start of session.  Pain Assessment  Pain Assessment: No/denies pain.        Objective:   Observation of Patient/Vital Signs:  Patient is in bed with telemetry, peripheral IV in place.        Cognition/Neuro Status  Arousal/Alertness: Appropriate responses  to stimuli  Attention Span: Appears intact  Orientation Level: Oriented X4  Memory: Appears intact  Following Commands: independent  Safety Awareness: minimal verbal instruction  Insights: Fully aware of deficits  Behavior: attentive;calm;cooperative  Coordination: FMC impaired(slow to perform serial opposition)  Hand Dominance: right handed    Gross ROM  Right Upper Extremity ROM: within functional limits except Shoulder F decreased by 50%  Left Upper Extremity ROM: within functional limits except shoulder F decreased by 50%  Gross Strength via clinical observation  Right Upper Extremity Strength: within functional limits except Shoulder <3/5; grip fair  Left Upper Extremity Strength: within functional limits except shoulder <3/5; grip fair          Sensory  Auditory: intact  Tactile - Light Touch: intact(as per Patient report)       Self-care and Home Management  Grooming: Supervision;supervision/safety;wash/dry hands;wash/dry face  Toileting: Maximal Assist to maintain standing  w/ B UE support; Dependent for bottom hygiene;standing;clothing management up;clothing management down(bottom hygiene)    Mobility and Transfers  Supine to Sit: Maximal Assist  Sit to Stand: Maximal Assist(from bed and arm-chair)  Bed to Chair: Maximal Assist(via SPT technique; 2 person assist for safety)  -verbal/tactile instructions for proper hand placement and pacing with all functional transfers for increased safety.        Balance  Static Sitting Balance: fair  Dyanamic Sitting Balance: fair  Static Standing Balance: poor(B UE support)  Dynamic Standing Balance: poor(B UE support)    Participation and Endurance  Participation Effort: fair  Endurance: Tolerates 10 - 20 min exercise with multiple rests; Patient denied feeling lightheaded or dizzy during the session.      AM-PACT "6 Clicks" Daily Activity Inpatient Short Form  Inpatient AM-PACT Performed?: yes  Put On/Take Off Lower Body Clothing: Total  Assist with Bathing: Total  Assist with Toileting: Total  Put On/Take Off Upper Body Clothing: A lot  Assist with Grooming: A little  Assist with Eating: A little  OT Daily Activity Raw Score: 11  CMS 0-100% Score: 70.42%    PMP - Progressive Mobility Protocol   PMP Activity: Step 5 - Chair    Treatment Activities: Patient stood for approx. <60 seconds with Max A to maintain standing with B UE support while assist of another performed bottom hygiene. Noted Patient's R UE swollen compared to L UE; RN notified of same. Patient's B UEs positioned on pillows at end of session for edema mgmt. Patient educated in importance of OOB sitting for all meals to increase endurance/strength for activities and for pulmonary hygiene.Patient seated in arm-chair at end of session with all needs within reach. Patient instructed to ring for nursing for all needs and appeared receptive to all education provided. Chair Alarm activated for Patient's safety and RN notified of session outcome.      Educated the patient to role of  occupational therapy, plan of care, goals of therapy and safety with mobility and ADLs, home safety.          Therapist PPE during session procedural mask, face shield  and gloves     Tennis Ship. Trixie Deis, MS,OTR/L  Pager # [phone]  [phone]

## 2019-10-08 NOTE — Progress Notes (Signed)
Infectious Disease            Progress Note    10/08/2019   Keith Gates UJW:[id number] is a 63 y.o. male, history of diabetes mellitus, hypertension, hyperlipidemia, stroke, coronary artery disease status post coronary artery bypass grafting who was admitted after cardiac arrest, CPR, with sepsis, UTI, possible pneumonia, aspiration.    Subjective:     [name] today Symptoms: Afebrile, feeling much better, denies any new complaints. Other review of system is non contributory.    Objective:     Blood pressure 127/80, pulse 92, temperature 99.3 F (37.4 C), temperature source Axillary, resp. rate 18, height 1.803 m (5\' 11" ), weight 79.3 kg (174 lb 13.2 oz), SpO2 95 %.    General Appearance:  In no acute distress  HEENT: Pallor positive, Anicteric sclera.   Neck: Supple  Lungs:Decreased breath sound at bases.   Chest Wall: Symmetric chest wall expansion.   Heart : S1 and S2.   Abdomen: Abdomen is soft, bowel sounds positive.  Neurological:  Awake and responsive, moves all extremities    Laboratory And Diagnostic Studies:     Recent Labs     10/08/19  0425 10/07/19  0346   WBC 7.17 7.54   Hgb 8.2* 8.1*   Hematocrit 25.0* 24.2*   Platelets 125* 108*   Neutrophils 61.0 61.7     Recent Labs     10/08/19  0425 10/07/19  0346   Sodium 144 144   Potassium 4.4 4.1   Chloride 113* 112*   CO2 22 22   BUN 35.7* 41.2*   Creatinine 1.5* 1.6*   Glucose 144* 79   Calcium 8.4* 8.4*     Recent Labs     10/08/19  0425 10/07/19  0346   AST (SGOT) 49* 48*   ALT 57* 53   Alkaline Phosphatase 183* 177*   Protein, Total 5.9* 5.6*   Albumin 2.3* 2.1*   Bilirubin, Total 0.5 0.4       Current Med's:     Current Facility-Administered Medications   Medication Dose Route Frequency    aspirin  81 mg Oral Daily    atorvastatin  40 mg Oral Daily    carboxymethylcellulose (PF)  1 drop Both Eyes Q8H    chlorhexidine  15 mL Mouth/Throat Q12H SCH    docusate  100 mg Oral Daily    enoxaparin  30 mg Subcutaneous Q24H  SCH    ferrous sulfate  324 mg Oral QAM W/BREAKFAST    furosemide  20 mg Intravenous Daily    insulin lispro  1-3 Units Subcutaneous QHS    insulin lispro  1-5 Units Subcutaneous TID AC    levETIRAcetam  250 mg Intravenous Q12H SCH    midodrine  5 mg per NG tube Q8H    modafinil  100 mg per OG tube Daily    piperacillin-tazobactam  4.5 g Intravenous Q8H    sodium phosphate IVPB  15 mmol Intravenous Once       Lines/Drains:     Patient Lines/Drains/Airways Status    Active Lines, Drains and Airways     Name:   Placement date:   Placement time:   Site:   Days:    Peripheral IV 10/08/19 22 G Anterior;Left Upper Arm   10/08/19    1140    Upper Arm   less than 1    Peripheral IV 10/08/19 22 G Anterior;Right Upper Arm   10/08/19    1145  Upper Arm   less than 1                Assessment:      Condition: Guarded   Sepsis; shock   Cardiac arrest status post CPR   Respiratory failure status post extubation   UTI   Pneumonia; possible aspiration   History of stroke   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 Grammatical errors, random word insertions, deletions, pronoun errors and incomplete sentences are occasional consequences of this technology due to software limitations. Not all errors are caught or corrected. If there are questions or concerns about the content of this note or information contained within the body of this dictation they should be addressed directly with the  author for clarification

## 2019-10-08 NOTE — Progress Notes (Signed)
Danella Sensing with the [facility] office called and told the CM that the patient will be transferred to the [facility] center when bed available.  Case Manager talked to the patient and he agrees for transfer.    CM requested Dr. Lanora Manis to order a Rapid Covid-19 test and Tyler Aas, the Charge nurse, at [phone] to prepare a CD for all the diagnostic tests done in this hospital as Danella Sensing with [facility] is requesting.      Joyce Gross told the CM that the [facility] will call with the room as well as report number and also will arrange ambulance transportation.      Jonetta Osgood, RN MSN ACM  Clinical Case Manager II  [phone]

## 2019-10-08 NOTE — Progress Notes (Signed)
Case Manager faxed the covid-19 test results to [facility] hospital at the fax number [phone], which was given by Danella Sensing to the Case Manager.  TEST RESULT IS NEGATIVE.    Jonetta Osgood, RN MSN ACM  Clinical Case Manager II  [phone]

## 2019-10-08 NOTE — Progress Notes (Signed)
[facility]  PROGRESS NOTE   HOSPITAL    Date Time: 10/08/19 7:49 AM  Patient Name: Keith Gates, Keith Gates       Patient Active Problem List   Diagnosis    Non-STEMI (non-ST elevated myocardial infarction)    DKA (diabetic ketoacidoses)    Heart block    Cardiac arrest    Iron deficiency anemia, unspecified iron deficiency anemia type       Assessment:      Cardiac arrest, cause not clear, with subsequent complete heart block in the setting of metabolic derangements, pyuria, ARF, leukocytosis, septic shock, PNA   Temporary Pacing wire inserted12/14/20by Dr.Lee, removed 10/03/19   Per EP, as pacing support was no longer required, low suspicion that bradyarrhythmia or heart block/primary arrhythmia was cause of arrest   S/p s/p arctic sun therapeutic hypothermia protocol with good neuro recovery, extubated 10/05/19.  Memory impairment.   CAD status post CABG 01/2019 x 4 vessel (details not readily available)   Ischemic CMP with LVEF 20% by echo April 2019   COVID negative   AKI on CKD2, Scr 1.9-> 1.6   DM   HTN   Prior CVA   HLD   COVID negative    Recommendations:    Await echo - probable MPI testing unless significant abnormalities ie. LV dysfunction   Otherwise, cardiac cath with limited contrast    Follow I/Os closely given +7 liters since admission      Medications:      Scheduled Meds: PRN Meds:    aspirin, 81 mg, Oral, Daily  atorvastatin, 40 mg, Oral, Daily  carboxymethylcellulose (PF), 1 drop, Both Eyes, Q8H  chlorhexidine, 15 mL, Mouth/Throat, Q12H SCH  docusate, 100 mg, Oral, Daily  enoxaparin, 30 mg, Subcutaneous, Q24H SCH  ferrous sulfate, 324 mg, Oral, QAM W/BREAKFAST  insulin lispro, 1-3 Units, Subcutaneous, QHS  insulin lispro, 1-5 Units, Subcutaneous, TID AC  levETIRAcetam, 250 mg, Intravenous, Q12H SCH  midodrine, 5 mg, per NG tube, Q8H  modafinil, 100 mg, per OG tube, Daily  piperacillin-tazobactam, 4.5 g, Intravenous, Q8H        Continuous Infusions:   sodium  chloride, , PRN  dextrose, 15 g of glucose, PRN    And  dextrose, 12.5 g, PRN              Subjective:   Denies chest pain, SOB or palpitations.  Awake but does not recall conversation from yesterday regarding cardiac workup      Physical Exam:     Vitals:    10/08/19 0600   BP: 141/68   Pulse: 88   Resp: 21   Temp:    SpO2: 94%     Temp (24hrs), Avg:99.1 F (37.3 C), Min:98.6 F (37 C), Max:99.3 F (37.4 C)      Telemetry reviewed no changes.     Intake and Output Summary (Last 24 hours) at Date Time    Intake/Output Summary (Last 24 hours) at 10/08/2019 0749  Last data filed at 10/08/2019 0000  Gross per 24 hour   Intake 667.3 ml   Output 750 ml   Net -82.7 ml       General Appearance:  Breathing comfortable, no acute distress  Head:  normocephalic  Eyes:  EOM's intact  Neck:  No carotid bruit or jugular venous distension, brisk carotid upstroke  Lungs:  Clear to auscultation throughout, no wheezes, rhonchi or rales, good respiratory effort   Chest Wall:  No tenderness or deformity  Heart:  S1,  S2 normal, no S3, no S4, no murmur, PMI not displaced, no rub   Abdomen:  Soft, non-tender, positive bowel sounds  Extremities:  No cyanosis, clubbing or edema  Pulses:  Equal radial pulses, 4/4 symmetric  Neurologic:  Alert and oriented x3, mood and affect normal  Musculoskeletal: impaired strength and tone    Labs:     Recent Labs   Lab 10/01/19  0903   Troponin I 0.93*         Recent Labs   Lab 10/04/19  0412   Triglycerides 84     Recent Labs   Lab 10/08/19  0425   Bilirubin, Total 0.5   Protein, Total 5.9*   Albumin 2.3*   ALT 57*   AST (SGOT) 49*     Recent Labs   Lab 10/08/19  0425   Magnesium 1.8     Recent Labs   Lab 10/08/19  0425   PT 13.6   PT INR 1.1     Recent Labs   Lab 10/08/19  0425 10/07/19  0346 10/06/19  0329   WBC 7.17 7.54 6.89   Hgb 8.2* 8.1* 8.7*   Hematocrit 25.0* 24.2* 26.1*   Platelets 125* 108* 100*     Recent Labs   Lab 10/08/19  0425 10/07/19  0346 10/06/19  0329   Sodium 144 144 140    Potassium 4.4 4.1 4.1   Chloride 113* 112* 109   CO2 22 22 21*   BUN 35.7* 41.2* 42.7*   Creatinine 1.5* 1.6* 1.8*   EGFR 47.5 44.1 38.5   Glucose 144* 79 176*   Calcium 8.4* 8.4* 8.0*     Recent Labs   Lab 10/07/19  0346   TSH 1.30       .  Lab Results   Component Value Date    BNP 1,474.1 (H) 10/08/2019      Estimated Creatinine Clearance: 55.1 mL/min (A) (based on SCr of 1.5 mg/dL (H)).    Weight Monitoring 10/01/2019 10/02/2019 10/03/2019 10/04/2019 10/05/2019 10/06/2019 10/07/2019   Height - - - - - - -   Height Method - - - - - - -   Weight 76.4 kg 80.3 kg 80.7 kg 80.8 kg 83.5 kg 81.3 kg 79.3 kg   Weight Method Bed Scale Bed Scale Bed Scale Bed Scale Bed Scale Bed Scale Bed Scale   BMI (calculated) - - - - - - -         Imaging:   Radiological Procedure reviewed.              Signed by: Encarnacion Slates, MD        [facility]  NP Spectralink [phone] (8am-5pm)  MD Spect[employer]ink [phone] (8am-5pm)  After hours, non urgent consult line [phone]  After Hours, urgent consults or to reach the on-call MD [phone]

## 2019-10-08 NOTE — Plan of Care (Addendum)
Pt is A&Ox2-3. Echo is done in AM. Seen by SLP, continue pureed. Attended by PT/OT, sitting in chair since 10:30am, back to bed at 3:30pm. CVC removed at 12pm, pressure applied for 10 minutes, no bleeding noted. CHG bath is done, incontinent, condom cath applied. IV inserted over left and right upper arm. For NM tomorrow, NPO tonight midnight. Rapid covid-neg.    Problem: Safety  Goal: Patient will be free from injury during hospitalization  Flowsheets (Taken 10/07/2019 1617)  Patient will be free from injury during hospitalization:   Assess patient's risk for falls and implement fall prevention plan of care per policy   Provide and maintain safe environment   Use appropriate transfer methods   Ensure appropriate safety devices are available at the bedside   Include patient/ family/ care giver in decisions related to safety   Hourly rounding   Assess for patients risk for elopement and implement Elopement Risk Plan per policy     Problem: Compromised Tissue integrity  Goal: Damaged tissue is healing and protected  Flowsheets (Taken 10/07/2019 1639)  Damaged tissue is healing and protected:   Monitor/assess Braden scale every shift   Reposition patient every 2 hours and as needed unless able to reposition self   Increase activity as tolerated/progressive mobility   Provide wound care per wound care algorithm   Relieve pressure to bony prominences for patients at moderate and high risk   Avoid shearing injuries   Use bath wipes, not soap and water, for daily bathing   Keep intact skin clean and dry   Use incontinence wipes for cleaning urine, stool and caustic drainage. Foley care as needed   Monitor external devices/tubes for correct placement to prevent pressure, friction and shearing   Encourage use of lotion/moisturizer on skin   Monitor patient's hygiene practices

## 2019-10-08 NOTE — Progress Notes (Addendum)
[facility]- Medical Critical Care Service [facility]) Progress Note        Date / Time: 10/07/2010:25 AM Room:   W311/W311-A  Patient:   Keith Gates, Keith Gates   Admitted: 09/30/2019  Attending:   Elvera Lennox, MD   Status: Full Code     Critical Care PROGRESS Note --  Hospital Day /  LOS: 8 days           Assessment    Davaris Wigton is a 63 y.o. male with cardiac arrest      Last 24 Hrs     Transferred to [facility]    Hospital Course     10/07/2019: transferred to [facility]  10/08/2019: no new issues    Plan     Critical Care support by organ system:    NEURO: s/p cardiac arrest: patient has had remarkable neurologic recovery following TTM. He is awake and alert though speech remains slow and he remains incontinent. Will continue Provigil and Keppra    CV:   Cardiac Arrest:  Echo apparently has a normal EF! Nuclear stress test requested for tomorrow than possible cardiac cath. BNP markedly elevated. Will begin daily lasix.  CAD: history of CABG. Echo from 01/2018 showed EF 20-25% with apical thrombus and pulmonary HTN  Hypotension: on midodrine    PULM: Oxygenation excellent on minimal supplemental oxygen     GI: patient has passed swallow evaluation. Will continue pureed diet  GI ZOX:WRUE  Nutrition: pureed diet    RENAL: AKI: patient has had remarkable improvement in renal function but appears to have baseline CKD.    ID:   Serratia marcescens pneumonia/ UTI due to ? Organisom: on Zosyn    HEME: H/H stable  SCD's in place.   Pharmacologic ppx: is safe    ENDO/RHEUM:   DM: on accuchecks  TSH: 1.3    Lines/Foley:   Triple lumen catheter Centrally delivered medications / fluids  Foley: none    Code Status: Full Code  Patient is currently is able to make medical decisions.      Patients designated proxy is not present.    Discussed current diagnoses, prognosis and goals of care.     Disposition: [facility]    Consultants: cardiology, neurology, ID     Discussed with [facility] who will likely transfer patient to [facility]. Once bed  is available, the acceptin MD is Marijean Bravo, MD     Physical Exam   BP 127/80    Pulse 92    Temp 98.6 F (37 C) (Oral)    Resp 18    Ht 1.803 m (5\' 11" )    Wt 79.3 kg (174 lb 13.2 oz)    SpO2 95%    BMI 24.38 kg/m    General Appearance:  chronically ill appearing  Mental status:  Awake and alert  Neuro:  neck supple without rigidity  Lungs: clear to auscultation, no wheezes, rales or rhonchi, symmetric air entry  Cardiac: normal rate, regular rhythm, normal S1, S2, no murmurs, rubs, clicks or gallops  Abdomen:  soft, nontender, nondistended, no masses or organomegaly  Extremities: peripheral pulses normal, no pedal edema, no clubbing or cyanosis   Skin:   normal coloration and turgor, no rashes, no suspicious skin lesions noted  Other:       Data / Meds / Labs / Rads     Current Facility-Administered Medications   Medication Dose Route Frequency    aspirin  81 mg Oral Daily  atorvastatin  40 mg Oral Daily    carboxymethylcellulose (PF)  1 drop Both Eyes Q8H    chlorhexidine  15 mL Mouth/Throat Q12H SCH    docusate  100 mg Oral Daily    enoxaparin  30 mg Subcutaneous Q24H SCH    ferrous sulfate  324 mg Oral QAM W/BREAKFAST    insulin lispro  1-3 Units Subcutaneous QHS    insulin lispro  1-5 Units Subcutaneous TID AC    levETIRAcetam  250 mg Intravenous Q12H SCH    midodrine  5 mg per NG tube Q8H    modafinil  100 mg per OG tube Daily    piperacillin-tazobactam  4.5 g Intravenous Q8H    sodium phosphate IVPB  15 mmol Intravenous Once          Intake/Output Summary (Last 24 hours) at 10/08/2019 1125  Last data filed at 10/08/2019 0820  Gross per 24 hour   Intake 664 ml   Output 400 ml   Net 264 ml     Recent Labs     10/08/19  0425 10/07/19  0346 10/06/19  0329   WBC 7.17 7.54 6.89   Hgb 8.2* 8.1* 8.7*   Hematocrit 25.0* 24.2* 26.1*     Recent Labs     10/08/19  0425 10/07/19  0346 10/06/19  0329   Sodium 144 144 140   Potassium 4.4 4.1 4.1   Chloride 113* 112* 109   CO2 22 22 21*   BUN 35.7* 41.2*  42.7*   Creatinine 1.5* 1.6* 1.8*   Glucose 144* 79 176*   Calcium 8.4* 8.4* 8.0*   Magnesium 1.8 1.8 1.8   Phosphorus 2.0* 2.3 2.5     Recent Labs     10/08/19  0425   PT 13.6   PT INR 1.1     Echocardiogram Adult Complete W Color Doppler Waveform   Final Result      XR Chest AP Portable   Final Result    Diminished bibasilar atelectasis.      Prince Solian, MD    10/08/2019 8:00 AM      XR Chest AP Portable   Final Result         1. Left IJ approach central venous catheter with tip likely in the left   innominate vein.   2. Slightly increasing right lung base opacities compared to prior.      Jorge Mandril, MD    10/06/2019 9:23 AM      CT Head WO Contrast   Final Result       1. No acute intracranial abnormality is identified.   2. Chronic ischemic changes.      Nicoletta Dress, MD    10/03/2019 6:28 PM      US Renal Kidney Bladder Complete   Final Result    No hydronephrosis.      Rocky Crafts, MD    10/02/2019 10:13 PM      XR Chest AP Portable   Final Result         Lines in good position. Hypoventilation with left basilar atelectasis.   No significant consolidation or pneumothorax.      Charlott Rakes, MD    09/30/2019 10:56 PM      Chest AP Portable   Final Result    Bilateral infiltrates suspicious for pulmonary edema.                  Will Bonnet, MD    09/30/2019  3:26 PM      Temporary Pacer Insertion    (Results Pending)   XR Chest AP Portable    (Results Pending)   NM Myocardial Perfusion Spect (Stress And Rest)    (Results Pending)     _____________________________________________________________________    This patient has a high probability of sudden clinically significant deterioration which requires the highest level of physician preparedness to intervene urgently. I managed/supervised life or organ supporting interventions that required frequent physician assessments. I devoted my full attention in the ICU to the direct care of this patient for this period of time.  Organ systems that require  intensive critical care support  are described in more detail in the note assessment and plan above.     Any critical care time was performed today and is exclusive of teaching, billable procedures, and not overlapping with any other providers.     Critical care time: 35 minutes.    Signed by: Elvera Lennox  10/08/2019 11:25 AM    ZO:[id number], No Family (Inactive)

## 2019-10-08 NOTE — Progress Notes (Signed)
[facility]  NEPHROLOGY PROGRESS NOTE    Date Time: 10/08/19 5:21 PM  Patient Name: Keith Gates, Keith Gates  MRN#: [id number]  DOB: [birth year]  Requesting Physician: Elvera Lennox, MD      Reason for Consultation:   ARF on CKD II    Assessment:   ARF  CKD II  Hyperkalemia  Acidosis  HypoPO4  Anemia  HTN    Plan:   - Renal: pt with ARF on CKD II. His CKD with proteinuria is due to his underlying DM and HTN. His ARF is due to ATN by his cardiac arrest causing hemodynamic instability. His renal function is improving with an acceptable UOP. I will follow his renal function closely with you. Please avoid nephrotoxins like ACEI/ARB, NSAIDs and contrast as much as possible.    - Hyperkalemia: severe, due to ARF but improving with conservative management. I will follow his levels closely     - Acidosis: due to ARF and lactic acidosis. Improved. Follow levels.    - HypoPO4: he was hyperPO4 on admission which is now resolved. I will follow his levels    - Anemia: I gave him iv venofer for severe iron deficiency. I will continue him on oral iron.    - HTN: BP is improved. Continue his midodrine 5mg  po TID. Will continue the current management       History:   Keith Gates is a 63 y.o. male who presents to the hospital on 09/30/2019 after cardiac arrest requiring 6 minutes of resuscitation on site by EMS. The pt has a h/o controlled DM, controlled HTN and CKD stage II with a baseline sCr in the low 1's and proteinuria. He has also a h/o CAD s/p CABG earlier 2020.   He was apparently c/o generalized weakness and SOB for 1-2 days and then was found unresponsive and bradycardic by his wife on the bed. EMS was called who found him to be bradycardic and he went into asystole. CPR was done for 6 minutes. He regained pulse and was intubated and admitted to the ICU. On admission his sCr was 4.1 and his K peaked at 7.3. His glucose was 392. He was treated medically with with pressors for low BP and dobutamine drip and  also iv bicarb for his acidosis and his sCr is now better at 3.1 and his K was 5.4. His CXR shows clear lungs.     When I saw the pt he was feeling better. His arms are edematous. Pt denies any CP/SOB/N/V/D/abd pain/fever/chills/cough/sputum/dysuria/hematuria      Past Medical History:     Past Medical History:   Diagnosis Date    Diabetes mellitus     Elevated cholesterol     Hypertension     Stroke        Past Surgical History:     Past Surgical History:   Procedure Laterality Date    CORONARY ARTERY BYPASS GRAFT  01/2018    TEMPORARY PACER INSERTION N/A 09/30/2019    Procedure: TEMPORARY PACER INSERTION;  Surgeon: Acie Fredrickson, MD;  Location: [facility] CARDIAC CATH/EP;  Service: Cardiovascular;  Laterality: N/A;       Family History:   History reviewed. No pertinent family history.    Social History:     Social History     Socioeconomic History    Marital status: Married     Spouse name: Not on file    Number of children: Not on file    Years of education: Not  on file    Highest education level: Not on file   Occupational History    Not on file   Social Needs    Financial resource strain: Not on file    Food insecurity     Worry: Not on file     Inability: Not on file    Transportation needs     Medical: Not on file     Non-medical: Not on file   Tobacco Use    Smoking status: Former Smoker     Years: 30.00     Quit date: 10/17/2002     Years since quitting: 16.9    Smokeless tobacco: Never Used   Substance and Sexual Activity    Alcohol use: Yes    Drug use: No    Sexual activity: Not on file   Lifestyle    Physical activity     Days per week: Not on file     Minutes per session: Not on file    Stress: Not on file   Relationships    Social connections     Talks on phone: Not on file     Gets together: Not on file     Attends religious service: Not on file     Active member of club or organization: Not on file     Attends meetings of clubs or organizations: Not on file     Relationship status: Not on  file    Intimate partner violence     Fear of current or ex partner: Not on file     Emotionally abused: Not on file     Physically abused: Not on file     Forced sexual activity: Not on file   Other Topics Concern    Not on file   Social History Narrative    Not on file       Allergies:   No Known Allergies    Medications:     Current Facility-Administered Medications   Medication Dose Route Frequency    aspirin  81 mg Oral Daily    atorvastatin  40 mg Oral Daily    docusate  100 mg Oral Daily    [START ON 10/09/2019] enoxaparin  40 mg Subcutaneous Q24H SCH    ferrous sulfate  324 mg Oral QAM W/BREAKFAST    furosemide  20 mg Intravenous Daily    insulin lispro  1-3 Units Subcutaneous QHS    insulin lispro  1-5 Units Subcutaneous TID AC    levETIRAcetam  250 mg Intravenous Q12H SCH    midodrine  5 mg per NG tube Q8H    modafinil  100 mg per OG tube Daily    piperacillin-tazobactam  4.5 g Intravenous Q8H       Review of Systems:   As per HPI, all other systems were reviewed and were negative.    Physical Exam:     Vitals:    10/08/19 1600   BP: 140/68   Pulse: 89   Resp: 18   Temp:    SpO2: 96%       Intake and Output Summary (Last 24 hours) at Date Time    Intake/Output Summary (Last 24 hours) at 10/08/2019 1721  Last data filed at 10/08/2019 1400  Gross per 24 hour   Intake 714 ml   Output 375 ml   Net 339 ml       General appearance - alert, well appearing, and in no distress  Mental status -  alert, oriented to person, place, and time  Eyes - pupils equal and reactive, extraocular eye movements intact  Mouth - mucous membranes moist, pharynx normal without lesions  Neck - supple, no significant adenopathy  Lymphatics - no palpable lymphadenopathy, no hepatosplenomegaly  Chest - clear to auscultation, no wheezes, rales or rhonchi, symmetric air entry  Heart - normal rate, regular rhythm, normal S1, S2, no murmurs, rubs, clicks or gallops  Abdomen - soft, nontender, nondistended, no masses or  organomegaly  Neurological - alert, oriented, normal speech, no focal findings or movement disorder noted  Musculoskeletal - no joint tenderness, deformity or swelling  Extremities - peripheral pulses normal, no pedal edema, no clubbing or cyanosis  Skin - normal coloration and turgor, no rashes, no suspicious skin lesions noted    Labs Reviewed:     Results     Procedure Component Value Units Date/Time    COVID-19 (SARS-CoV-2) ([facility] Rapid test)- Required by Extended care facility discharge within 24 hours [[id number]] Collected: 10/08/19 1550    Specimen: Nasopharyngeal Swab from Nasopharynx Updated: 10/08/19 1624     Purpose of COVID testing Screening     SARS-CoV-2 Specimen Source Nasopharyngeal     SARS CoV 2 Overall Result Negative    Narrative:      o Collect and clearly label specimen type:  o Upper respiratory specimen: One Nasopharyngeal Dry Swab NO  Transport Media.  o Hand deliver to laboratory ASAP  Indication for testing->Extended care facility admission to  semi private room  Testing as required by extended care facility?->Yes    Glucose Whole Blood - POCT [[id number]]  (Abnormal) Collected: 10/08/19 1547     Updated: 10/08/19 1556     Whole Blood Glucose POCT 178 mg/dL     Glucose Whole Blood - POCT [[id number]]  (Abnormal) Collected: 10/08/19 1128     Updated: 10/08/19 1132     Whole Blood Glucose POCT 197 mg/dL     Glucose Whole Blood - POCT [[id number]]  (Abnormal) Collected: 10/08/19 0748     Updated: 10/08/19 0850     Whole Blood Glucose POCT 118 mg/dL     IRON PROFILE [[id number]]  (Abnormal) Collected: 10/08/19 0425     Updated: 10/08/19 0847     Iron 40 ug/dL      UIBC 301 ug/dL      TIBC 601 ug/dL      Iron Saturation 20 %     Hemolysis index [[id number]] Collected: 10/08/19 0425     Updated: 10/08/19 0831     Hemolysis Index 6    Prothrombin time/INR [[id number]] Collected: 10/08/19 0425    Specimen: Blood Updated: 10/08/19 0503     PT 13.6 sec      PT INR 1.1    B-type Natriuretic Peptide  [[id number]]  (Abnormal) Collected: 10/08/19 0425    Specimen: Blood Updated: 10/08/19 0459     B-Natriuretic Peptide 1,474.1 pg/mL     GFR [[id number]] Collected: 10/08/19 0425     Updated: 10/08/19 0451     EGFR 47.5    Comprehensive metabolic panel [[id number]]  (Abnormal) Collected: 10/08/19 0425    Specimen: Blood Updated: 10/08/19 0451     Glucose 144 mg/dL      BUN 54.6 mg/dL      Creatinine 1.5 mg/dL      Sodium 270 mEq/L      Potassium 4.4 mEq/L      Chloride 113 mEq/L      CO2 22 mEq/L  Calcium 8.4 mg/dL      Protein, Total 5.9 g/dL      Albumin 2.3 g/dL      AST (SGOT) 49 U/L      ALT 57 U/L      Alkaline Phosphatase 183 U/L      Bilirubin, Total 0.5 mg/dL      Globulin 3.6 g/dL      Albumin/Globulin Ratio 0.6     Anion Gap 9.0    Magnesium [[id number]] Collected: 10/08/19 0425    Specimen: Blood Updated: 10/08/19 0451     Magnesium 1.8 mg/dL     Phosphorus [[id number]]  (Abnormal) Collected: 10/08/19 0425    Specimen: Blood Updated: 10/08/19 0451     Phosphorus 2.0 mg/dL     CBC and differential [[id number]]  (Abnormal) Collected: 10/08/19 0425    Specimen: Blood Updated: 10/08/19 0434     WBC 7.17 x10 3/uL      Hgb 8.2 g/dL      Hematocrit 08.6 %      Platelets 125 x10 3/uL      RBC 2.85 x10 6/uL      MCV 87.7 fL      MCH 28.8 pg      MCHC 32.8 g/dL      RDW 16 %      MPV 11.5 fL      Neutrophils 61.0 %      Lymphocytes Automated 18.4 %      Monocytes 12.4 %      Eosinophils Automated 7.4 %      Basophils Automated 0.4 %      Immature Granulocytes 0.4 %      Nucleated RBC 0.0 /100 WBC      Neutrophils Absolute 4.37 x10 3/uL      Lymphocytes Absolute Automated 1.32 x10 3/uL      Monocytes Absolute Automated 0.89 x10 3/uL      Eosinophils Absolute Automated 0.53 x10 3/uL      Basophils Absolute Automated 0.03 x10 3/uL      Immature Granulocytes Absolute 0.03 x10 3/uL      Absolute NRBC 0.00 x10 3/uL     Glucose Whole Blood - POCT [[id number]]  (Abnormal) Collected: 10/07/19 2226     Updated: 10/07/19 2232      Whole Blood Glucose POCT 166 mg/dL               Rads:   Radiological Procedure reviewed.   Xr Chest Ap Portable    Result Date: 10/08/2019   Diminished bibasilar atelectasis. Prince Solian, MD  10/08/2019 8:00 AM

## 2019-10-08 NOTE — Progress Notes (Signed)
Case Manager talked to Sammuel Bailiff with the [facility] at [phone] and she told the CM that the [facility] physician will call Dr. Lanora Manis to discuss about this patient.  Danella Sensing will call the CM after the discussion between physicians.  CM will follow up as needed.    Jonetta Osgood, RN MSN ACM  Clinical Case Manager II  [phone]

## 2019-10-08 NOTE — SLP Progress Note (Signed)
Speech Language Pathology  [facility]  [address]  [place], [place]. [address]    Department of Rehabilitation Services  [phone]    Speech Language and Pathology Therapy Treatment Note       Patient:  Keith Gates MRN#:  [id number]  [facility] INTERMEDIATE CARE UNIT [id number]    Time of treatment: SLP Received On: 10/08/19  Start Time: 2952  Stop Time: 1009  Time Calculation (min): 26 min  Treatment #  3    Date of Previous Session: 10/07/19  Previous Session Recommendations: Diet Solids Recommendation:DysphagiaPuree  Liquids Recommendation: Nectar Thick- Consider MBSS    Precautions and Contraindications:  Fall, aspiration    Assessment:    Ongoing oropharyngeal dysphagia. However, when pt provided verbal prompts to decrease rate, bolus size and minimize distractions (turn TV off) pt demonstrated functional swallow for pureed solids and nectar thick liquids.    Unable to complete MBSS this date due to equipment malfunction.    Ongoing, STM deficits. Benefits from written information to recall compensatory strategies and events of the day.     Plan:   Continue pureed and nectar thick liquids  MBSS when equipment is repaired  Risks/benefits/POC discussed     Recommendations: Defer to PT/OT recommendation, consider Acute Rehab,Patient anticipated to benefit from and to be able to engage in 3 hours of therapy a day for 5 days a week.      Goals:   Patient/caregiver will independently verbalize S/S of aspiration, aspiration precautions, feeding techniques for safe POin 7 sessions  Moderate prompts x 1 session   Patient will demonstrate no S/S of overt aspirationwith nectar thick liquidsin5sessions  Progressing x 1 session  Patient will demonstrate no S/S of overt aspirationwith puree dfetin 5sessions  Progressing x 1 session    Patient will demonstrate adequate oropharyngeal phase skills for mechanically alteredin 7 sessions  Not addressed this session    Patient will  participate in MBSS objectively assess oral and pharyngeal stages of swallow  Unable to complete this date due to equipment malfunction    Patient will demonstrate compensatory strategies during oral intake using: controlled bolus size, with moderate cues, to facility safe/effective swallowing, double swallows  Max cues neeed    Patient will demonstrate STM and delayed recall >=80% in 7 sessions  Delayed recall 2/3 over 30 minutes    Patient will demonstrate effective problem solving >=80% in 7 sessions    Diet Recommendations:   Pureed and nectar thick liquids      Subjective:   Patient's medical condition is appropriate for Speech Language Pathology Therapy intervention at this time.  Patient is agreeable to participation in the therapy session. Nursing clears patient for therapy. "I coughed a lot during breakfast but I am not coughing now" When questioned if the TV was on and pt was possibly eating to quickly during breakfast pt agreed that could have been occurring.     Objective:   Observation of Patient/Vital Signs:  Patient is in bed with telemetry and central line, peripheral IV in place.    Deglutition Skills  Position: upright 90 degrees, pt self fed  Food(s) Tested: 4 oz nectar thick liquids via cup edge; 4 oz puree via spoon  Oral Stage: bolus formation/control reduced, slow but effective, suspect AP propulsion reduced, slow but effective bolus prep and transfer, anterior loss of bolus on right - pt sensate and retrieved with lingual protrusion; no oral stasis  Pharyngeal Stage:laryngeal excursion appears mildly decreased to palpation; breath  sounds clear pre / post swallow and appears complete; swallow appears delayed but complete without overt clinical s/s aspiration.     Cognitive linguistic skills  Pt able to recall 2/3 compensatory strategies at end of session. Pt benefits from written prompts. Pt unable to recall recommendation made previous date for completion of MBSS. However, pt able to  recall rationale for not completing MBSS this date at end of session. (Beginning of session pt educated MBSS could not be completed this date due to equipment malfunction)    Educated the patient to role of Speech therapy, plan of care, goals of therapy, importance of small bites / sips, TV off during PO, sit fully upright, eat /drink slowly (information provided written and verbally)    Patient left without needs and call bell within reach. RN notified of session outcome.    Therapist PPE during session procedural mask, face shield  and gloves

## 2019-10-08 NOTE — UM Notes (Signed)
Hello    REF# [id number]   Clinical update for 10/08/19  C/b to Wynona Canes [phone]    Thank-you!        In [facility] 10/08/19 :  SLP, cont pureed    99.3, HR 96, RR 20-24, BP 152/76, sats 94%  Glucose 197, BNP 1474.1, BUN 35.7, Cr 1.5, Chlor 113, Ca 8.4, ALB 2.3, AST 49, ALT 57, Alk Phos 183, Mg 1.8, Phos 2.0, H&H 8.2/25, Platelets 125, RBC 2.85      CXRY 10/08/19 : Diminished bibasilar atelectasis.    ECHO 10/08/19 : Summary    * Left ventricular ejection fraction is [Empty] with an estimated ejection  fraction of [Empty]%.    * Left ventricular wall thickness is moderately increased.    * No significant valvular abnormalities.        Per Cardio 10/08/19 :  Assessment:      Cardiac arrest, cause not clear, with subsequent complete heart block in the setting of metabolic derangements, pyuria, ARF, leukocytosis, septic shock, PNA   Temporary Pacing wire inserted12/14/20by Dr.Lee, removed 10/03/19   Per EP, as pacing support was no longer required, low suspicion that bradyarrhythmia or heart block/primary arrhythmia was cause of arrest   S/p s/p arctic sun therapeutic hypothermia protocol with good neuro recovery, extubated 10/05/19.  Memory impairment.   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 LV dysfunction   Otherwise, cardiac cath with limited contrast    Follow I/Os closely given +7 liters since admission      Per ICU MD assessment/plan 10/08/19 :  NEURO: s/p cardiac arrest: patient has had remarkable neurologic recovery following TTM. He is awake and alert though speech remains slow and he remains incontinent. Will continue Provigil and Keppra  CV:   Cardiac Arrest:  Echo apparently has a normal EF! Nuclear stress test requested for  tomorrow than possible cardiac cath. BNP markedly elevated. Will begin daily lasix.  CAD: history of CABG. Echo from 01/2018 showed EF 20-25% with apical thrombus and pulmonary HTN  Hypotension: on midodrine  PULM: Oxygenation excellent on minimal supplemental oxygen   GI: patient has passed swallow evaluation. Will continue pureed diet  GI ZHY:QMVH  Nutrition: pureed diet  RENAL: AKI: patient has had remarkable improvement in renal function but appears to have baseline CKD.  ID:   Serratia marcescens pneumonia/ UTI due to ? Organisom: on Zosyn  HEME: H/H stable  SCD's in place.   Pharmacologic ppx: is safe  ENDO/RHEUM:   DM: on accuchecks  TSH: 1.3  Lines/Foley:   Triple lumen catheter Centrally delivered medications / fluids  Foley: none      Per Nephro 10/08/19 :  Assessment:   ARF  CKD II  Hyperkalemia  Acidosis  HypoPO4  Anemia  HTN  Plan:   - Renal: pt with ARF on CKD II. His CKD with proteinuria is due to his underlying DM and HTN. His ARF is due to ATN by his cardiac arrest causing hemodynamic instability. His renal function is improving with an acceptable UOP. I will follow his renal function closely with you. Please avoid nephrotoxins like ACEI/ARB, NSAIDs and contrast as much as possible.  -  Hyperkalemia: severe, due to ARF but improving with conservative management. I will follow his levels closely   - Acidosis: due to ARF and lactic acidosis. Improved. Follow levels.  - HypoPO4: he was hyperPO4 on admission which is now resolved. I will follow his levels  - Anemia: I gave him iv venofer for severe iron deficiency. I will continue him on oral iron.  - HTN: BP is improved. Continue his midodrine 5mg  po TID. 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   Complete  heart block   Coronary artery disease status post coronary bypass grafting   Ischemic cardiomyopathy   Diabetes mellitus   Acute renal failure    Plan:      Continue Zosyn   Cardiology follow-up   Wound care follow-up   Nephrology follow-up   Continue supportive care   Physical therapy   Discussed with family          ORDERS : IMC, NPO, ECHO, CXRY, VS q4, Activity as tol, PTOTST, tube feedings, Cardio, Nephro, ID    Current Facility-Administered Medications   Medication Dose Route Frequency    aspirin  81 mg Oral Daily    atorvastatin  40 mg Oral Daily    carboxymethylcellulose (PF)  1 drop Both Eyes Q8H    chlorhexidine  15 mL Mouth/Throat Q12H SCH    docusate  100 mg Oral Daily    enoxaparin  30 mg Subcutaneous Q24H SCH    ferrous sulfate  324 mg Oral QAM W/BREAKFAST    insulin lispro  1-3 Units Subcutaneous QHS    insulin lispro  1-5 Units Subcutaneous TID AC    levETIRAcetam  250 mg Intravenous Q12H SCH    midodrine  5 mg per NG tube Q8H    modafinil  100 mg per OG tube Daily    piperacillin-tazobactam  4.5 g Intravenous Q8H    sodium phosphate IVPB  15 mmol Intravenous Once

## 2019-10-09 ENCOUNTER — Inpatient Hospital Stay: Payer: PRIVATE HEALTH INSURANCE

## 2019-10-09 LAB — MAGNESIUM: Magnesium: 1.6 mg/dL (ref 1.6–2.6)

## 2019-10-09 LAB — COMPREHENSIVE METABOLIC PANEL
ALT: 65 U/L — ABNORMAL HIGH (ref 0–55)
AST (SGOT): 53 U/L — ABNORMAL HIGH (ref 5–34)
Albumin/Globulin Ratio: 0.8 — ABNORMAL LOW (ref 0.9–2.2)
Albumin: 2.6 g/dL — ABNORMAL LOW (ref 3.5–5.0)
Alkaline Phosphatase: 210 U/L — ABNORMAL HIGH (ref 38–106)
Anion Gap: 10 (ref 5.0–15.0)
BUN: 28.4 mg/dL — ABNORMAL HIGH (ref 9.0–28.0)
Bilirubin, Total: 0.5 mg/dL (ref 0.2–1.2)
CO2: 22 mEq/L (ref 22–29)
Calcium: 8.3 mg/dL — ABNORMAL LOW (ref 8.5–10.5)
Chloride: 111 mEq/L (ref 100–111)
Creatinine: 1.6 mg/dL — ABNORMAL HIGH (ref 0.7–1.3)
Globulin: 3.2 g/dL (ref 2.0–3.6)
Glucose: 150 mg/dL — ABNORMAL HIGH (ref 70–100)
Potassium: 4.1 mEq/L (ref 3.5–5.1)
Protein, Total: 5.8 g/dL — ABNORMAL LOW (ref 6.0–8.3)
Sodium: 143 mEq/L (ref 136–145)

## 2019-10-09 LAB — CBC AND DIFFERENTIAL
Absolute NRBC: 0 10*3/uL (ref 0.00–0.00)
Basophils Absolute Automated: 0.04 10*3/uL (ref 0.00–0.08)
Basophils Automated: 0.5 %
Eosinophils Absolute Automated: 0.53 10*3/uL — ABNORMAL HIGH (ref 0.00–0.44)
Eosinophils Automated: 6.6 %
Hematocrit: 26.1 % — ABNORMAL LOW (ref 37.6–49.6)
Hgb: 8.5 g/dL — ABNORMAL LOW (ref 12.5–17.1)
Immature Granulocytes Absolute: 0.03 10*3/uL (ref 0.00–0.07)
Immature Granulocytes: 0.4 %
Lymphocytes Absolute Automated: 1.36 10*3/uL (ref 0.42–3.22)
Lymphocytes Automated: 17 %
MCH: 28.3 pg (ref 25.1–33.5)
MCHC: 32.6 g/dL (ref 31.5–35.8)
MCV: 87 fL (ref 78.0–96.0)
MPV: 11.3 fL (ref 8.9–12.5)
Monocytes Absolute Automated: 0.89 10*3/uL — ABNORMAL HIGH (ref 0.21–0.85)
Monocytes: 11.1 %
Neutrophils Absolute: 5.16 10*3/uL (ref 1.10–6.33)
Neutrophils: 64.4 %
Nucleated RBC: 0 /100 WBC (ref 0.0–0.0)
Platelets: 158 10*3/uL (ref 142–346)
RBC: 3 10*6/uL — ABNORMAL LOW (ref 4.20–5.90)
RDW: 16 % — ABNORMAL HIGH (ref 11–15)
WBC: 8.01 10*3/uL (ref 3.10–9.50)

## 2019-10-09 LAB — GLUCOSE WHOLE BLOOD - POCT
Whole Blood Glucose POCT: 132 mg/dL — ABNORMAL HIGH (ref 70–100)
Whole Blood Glucose POCT: 137 mg/dL — ABNORMAL HIGH (ref 70–100)
Whole Blood Glucose POCT: 142 mg/dL — ABNORMAL HIGH (ref 70–100)
Whole Blood Glucose POCT: 160 mg/dL — ABNORMAL HIGH (ref 70–100)

## 2019-10-09 LAB — PHOSPHORUS: Phosphorus: 2.3 mg/dL (ref 2.3–4.7)

## 2019-10-09 LAB — B-TYPE NATRIURETIC PEPTIDE: B-Natriuretic Peptide: 1612.5 pg/mL — ABNORMAL HIGH (ref 0.0–100.0)

## 2019-10-09 LAB — GFR: EGFR: 44.1

## 2019-10-09 MED ORDER — ONDANSETRON HCL 4 MG/2ML IJ SOLN
4.00 mg | Freq: Three times a day (TID) | INTRAMUSCULAR | Status: DC | PRN
Start: 2019-10-09 — End: 2019-10-10
  Administered 2019-10-09: 13:00:00 4 mg via INTRAVENOUS
  Filled 2019-10-09: qty 2

## 2019-10-09 MED ORDER — REGADENOSON 0.4 MG/5ML IV SOLN
INTRAVENOUS | Status: AC
Start: 2019-10-09 — End: 2019-10-09
  Filled 2019-10-09: qty 5

## 2019-10-09 MED ORDER — FUROSEMIDE 10 MG/ML IJ SOLN
20.00 mg | Freq: Two times a day (BID) | INTRAMUSCULAR | Status: DC
Start: 2019-10-09 — End: 2019-10-10
  Administered 2019-10-09 – 2019-10-10 (×2): 20 mg via INTRAVENOUS
  Filled 2019-10-09 (×2): qty 2

## 2019-10-09 MED ORDER — TECHNETIUM TC 99M TETROFOSMIN IV KIT
33.00 | PACK | Freq: Once | INTRAVENOUS | Status: AC | PRN
Start: 2019-10-09 — End: 2019-10-09
  Administered 2019-10-09: 11:00:00 33 via INTRAVENOUS
  Filled 2019-10-09: qty 100

## 2019-10-09 MED ORDER — AMINOPHYLLINE 25 MG/ML IV SOLN
INTRAVENOUS | Status: DC
Start: 2019-10-09 — End: 2019-10-09
  Filled 2019-10-09: qty 10

## 2019-10-09 MED ORDER — LABETALOL HCL 100 MG PO TABS
100.0000 mg | ORAL_TABLET | Freq: Two times a day (BID) | ORAL | Status: DC
Start: 2019-10-09 — End: 2019-10-10
  Administered 2019-10-09 – 2019-10-10 (×2): 100 mg via ORAL
  Filled 2019-10-09 (×2): qty 1

## 2019-10-09 MED ORDER — RISAQUAD PO CAPS
1.00 | ORAL_CAPSULE | Freq: Every day | ORAL | Status: DC
Start: 2019-10-09 — End: 2019-10-10
  Administered 2019-10-09 – 2019-10-10 (×2): 1 via ORAL
  Filled 2019-10-09 (×2): qty 1

## 2019-10-09 MED ORDER — TECHNETIUM TC 99M TETROFOSMIN IV KIT
11.00 | PACK | Freq: Once | INTRAVENOUS | Status: AC | PRN
Start: 2019-10-09 — End: 2019-10-09
  Administered 2019-10-09: 11:00:00 11 via INTRAVENOUS
  Filled 2019-10-09: qty 100

## 2019-10-09 MED ORDER — ALBUTEROL-IPRATROPIUM 2.5-0.5 (3) MG/3ML IN SOLN
3.00 mL | Freq: Four times a day (QID) | RESPIRATORY_TRACT | Status: DC
Start: 2019-10-09 — End: 2019-10-10
  Administered 2019-10-09 – 2019-10-10 (×4): 3 mL via RESPIRATORY_TRACT
  Filled 2019-10-09 (×4): qty 3

## 2019-10-09 MED ORDER — LOSARTAN POTASSIUM 25 MG PO TABS
50.0000 mg | ORAL_TABLET | Freq: Every day | ORAL | Status: DC
Start: 2019-10-09 — End: 2019-10-09
  Administered 2019-10-09: 15:00:00 50 mg via ORAL
  Filled 2019-10-09: qty 2

## 2019-10-09 NOTE — Progress Notes (Signed)
[facility]  NEPHROLOGY PROGRESS NOTE    Date Time: 10/09/19 10:31 AM  Patient Name: Keith Gates, Keith Gates  MRN#: [id number]  DOB: 21-Dec-1955  Requesting Physician: Elvera Lennox, MD      Reason for Consultation:   ARF on CKD II    Assessment:   ARF  CKD II  Hyperkalemia  Acidosis  HypoPO4  Anemia  HTN    Plan:   - Renal: pt with ARF on CKD II. His CKD with proteinuria is due to his underlying DM and HTN. His ARF is due to ATN by his cardiac arrest causing hemodynamic instability. His renal function is stable with a good UOP. I will follow his renal function closely with you. Please avoid nephrotoxins like ACEI/ARB, NSAIDs and contrast as much as possible.    - Hyperkalemia: severe, due to ARF but improving with conservative management. I will follow his levels closely     - Acidosis: due to ARF and lactic acidosis. Improved. Follow levels.    - HypoPO4: he was hyperPO4 on admission which is now resolved. I will follow his levels    - Anemia: I gave him iv venofer for severe iron deficiency. I will continue him on oral iron.    - HTN: BP is on the high side. I will stop his losartan due to his recent ARF and hyperkalemia and start him on labetalol 100mg  po BID.      History:   Keith Gates is a 63 y.o. male who presents to the hospital on 09/30/2019 after cardiac arrest requiring 6 minutes of resuscitation on site by EMS. The pt has a h/o controlled DM, controlled HTN and CKD stage II with a baseline sCr in the low 1's and proteinuria. He has also a h/o CAD s/p CABG earlier 2020.   He was apparently c/o generalized weakness and SOB for 1-2 days and then was found unresponsive and bradycardic by his wife on the bed. EMS was called who found him to be bradycardic and he went into asystole. CPR was done for 6 minutes. He regained pulse and was intubated and admitted to the ICU. On admission his sCr was 4.1 and his K peaked at 7.3. His glucose was 392. He was treated medically with with pressors for  low BP and dobutamine drip and also iv bicarb for his acidosis and his sCr is now better at 3.1 and his K was 5.4. His CXR shows clear lungs.     When I saw the pt he was feeling better. His arms are edematous. Pt denies any CP/SOB/N/V/D/abd pain/fever/chills/cough/sputum/dysuria/hematuria      Past Medical History:     Past Medical History:   Diagnosis Date    Diabetes mellitus     Elevated cholesterol     Hypertension     Stroke        Past Surgical History:     Past Surgical History:   Procedure Laterality Date    CORONARY ARTERY BYPASS GRAFT  01/2018    TEMPORARY PACER INSERTION N/A 09/30/2019    Procedure: TEMPORARY PACER INSERTION;  Surgeon: Acie Fredrickson, MD;  Location: [facility] CATH/EP;  Service: Cardiovascular;  Laterality: N/A;       Family History:   History reviewed. No pertinent family history.    Social History:     Social History     Socioeconomic History    Marital status: Married     Spouse name: Not on file    Number of  children: Not on file    Years of education: Not on file    Highest education level: Not on file   Occupational History    Not on file   Social Needs    Financial resource strain: Not on file    Food insecurity     Worry: Not on file     Inability: Not on file    Transportation needs     Medical: Not on file     Non-medical: Not on file   Tobacco Use    Smoking status: Former Smoker     Years: 30.00     Quit date: 10/17/2002     Years since quitting: 16.9    Smokeless tobacco: Never Used   Substance and Sexual Activity    Alcohol use: Yes    Drug use: No    Sexual activity: Not on file   Lifestyle    Physical activity     Days per week: Not on file     Minutes per session: Not on file    Stress: Not on file   Relationships    Social connections     Talks on phone: Not on file     Gets together: Not on file     Attends religious service: Not on file     Active member of club or organization: Not on file     Attends meetings of clubs or organizations: Not on file      Relationship status: Not on file    Intimate partner violence     Fear of current or ex partner: Not on file     Emotionally abused: Not on file     Physically abused: Not on file     Forced sexual activity: Not on file   Other Topics Concern    Not on file   Social History Narrative    Not on file       Allergies:   No Known Allergies    Medications:     Current Facility-Administered Medications   Medication Dose Route Frequency    aminophylline        regadenoson        aspirin  81 mg Oral Daily    atorvastatin  40 mg Oral Daily    docusate  100 mg Oral Daily    enoxaparin  40 mg Subcutaneous Q24H SCH    ferrous sulfate  324 mg Oral QAM W/BREAKFAST    furosemide  20 mg Intravenous Daily    insulin lispro  1-3 Units Subcutaneous QHS    insulin lispro  1-5 Units Subcutaneous TID AC    levETIRAcetam  250 mg Intravenous Q12H SCH    midodrine  5 mg per NG tube Q8H    modafinil  100 mg per OG tube Daily    piperacillin-tazobactam  4.5 g Intravenous Q8H       Review of Systems:   As per HPI, all other systems were reviewed and were negative.    Physical Exam:     Vitals:    10/09/19 0900   BP: 181/82   Pulse: 89   Resp: 21   Temp:    SpO2: 95%       Intake and Output Summary (Last 24 hours) at Date Time    Intake/Output Summary (Last 24 hours) at 10/09/2019 1031  Last data filed at 10/09/2019 1000  Gross per 24 hour   Intake 1207 ml   Output 1800 ml  Net -593 ml       General appearance - alert, well appearing, and in no distress  Mental status - alert, oriented to person, place, and time  Eyes - pupils equal and reactive, extraocular eye movements intact  Mouth - mucous membranes moist, pharynx normal without lesions  Neck - supple, no significant adenopathy  Lymphatics - no palpable lymphadenopathy, no hepatosplenomegaly  Chest - clear to auscultation, no wheezes, rales or rhonchi, symmetric air entry  Heart - normal rate, regular rhythm, normal S1, S2, no murmurs, rubs, clicks or gallops  Abdomen -  soft, nontender, nondistended, no masses or organomegaly  Neurological - alert, oriented, normal speech, no focal findings or movement disorder noted  Musculoskeletal - no joint tenderness, deformity or swelling  Extremities - peripheral pulses normal, no pedal edema, no clubbing or cyanosis  Skin - normal coloration and turgor, no rashes, no suspicious skin lesions noted    Labs Reviewed:     Results     Procedure Component Value Units Date/Time    Glucose Whole Blood - POCT [[id number]]  (Abnormal) Collected: 10/09/19 0747     Updated: 10/09/19 0816     Whole Blood Glucose POCT 132 mg/dL     B-type Natriuretic Peptide [[id number]]  (Abnormal) Collected: 10/09/19 0513    Specimen: Blood Updated: 10/09/19 0618     B-Natriuretic Peptide 1,612.5 pg/mL     Phosphorus [[id number]] Collected: 10/09/19 0513    Specimen: Blood Updated: 10/09/19 0559     Phosphorus 2.3 mg/dL     GFR [[id number]] Collected: 10/09/19 0513     Updated: 10/09/19 0559     EGFR 44.1    Comprehensive metabolic panel [[id number]]  (Abnormal) Collected: 10/09/19 0513    Specimen: Blood Updated: 10/09/19 0559     Glucose 150 mg/dL      BUN 24.4 mg/dL      Creatinine 1.6 mg/dL      Sodium 010 mEq/L      Potassium 4.1 mEq/L      Chloride 111 mEq/L      CO2 22 mEq/L      Calcium 8.3 mg/dL      Protein, Total 5.8 g/dL      Albumin 2.6 g/dL      AST (SGOT) 53 U/L      ALT 65 U/L      Alkaline Phosphatase 210 U/L      Bilirubin, Total 0.5 mg/dL      Globulin 3.2 g/dL      Albumin/Globulin Ratio 0.8     Anion Gap 10.0    Magnesium [[id number]] Collected: 10/09/19 0513    Specimen: Blood Updated: 10/09/19 0559     Magnesium 1.6 mg/dL     CBC and differential [[id number]]  (Abnormal) Collected: 10/09/19 0513    Specimen: Blood Updated: 10/09/19 0536     WBC 8.01 x10 3/uL      Hgb 8.5 g/dL      Hematocrit 63.8 %      Platelets 158 x10 3/uL      RBC 3.00 x10 6/uL      MCV 87.0 fL      MCH 28.3 pg      MCHC 32.6 g/dL      RDW 16 %      MPV 11.3 fL      Neutrophils 64.4  %      Lymphocytes Automated 17.0 %      Monocytes 11.1 %      Eosinophils Automated 6.6 %  Basophils Automated 0.5 %      Immature Granulocytes 0.4 %      Nucleated RBC 0.0 /100 WBC      Neutrophils Absolute 5.16 x10 3/uL      Lymphocytes Absolute Automated 1.36 x10 3/uL      Monocytes Absolute Automated 0.89 x10 3/uL      Eosinophils Absolute Automated 0.53 x10 3/uL      Basophils Absolute Automated 0.04 x10 3/uL      Immature Granulocytes Absolute 0.03 x10 3/uL      Absolute NRBC 0.00 x10 3/uL     Prepare/Crossmatch Red Blood Cells [[id number]] Collected: 10/05/19 1511     Updated: 10/09/19 0019     RBC Leukoreduced RBC Leukoreduced     BLUNIT [id number]     Status transfused     PRODUCT CODE (NON READABLE) [id number]     Expiration Date [id number]     UTYPE A POS     ISBT CODE 6200    Glucose Whole Blood - POCT [[id number]]  (Abnormal) Collected: 10/08/19 2242     Updated: 10/08/19 2244     Whole Blood Glucose POCT 169 mg/dL     XLKGM-01 (SARS-CoV-2) ([facility] Rapid test)- Required by Extended care facility discharge within 24 hours [[id number]] Collected: 10/08/19 1550    Specimen: Nasopharyngeal Swab from Nasopharynx Updated: 10/08/19 1624     Purpose of COVID testing Screening     SARS-CoV-2 Specimen Source Nasopharyngeal     SARS CoV 2 Overall Result Negative    Narrative:      o Collect and clearly label specimen type:  o Upper respiratory specimen: One Nasopharyngeal Dry Swab NO  Transport Media.  o Hand deliver to laboratory ASAP  Indication for testing->Extended care facility admission to  semi private room  Testing as required by extended care facility?->Yes    Glucose Whole Blood - POCT [[id number]]  (Abnormal) Collected: 10/08/19 1547     Updated: 10/08/19 1556     Whole Blood Glucose POCT 178 mg/dL     Glucose Whole Blood - POCT [[id number]]  (Abnormal) Collected: 10/08/19 1128     Updated: 10/08/19 1132     Whole Blood Glucose POCT 197 mg/dL               Rads:   Radiological Procedure reviewed.   Xr  Chest Ap Portable    Result Date: 10/09/2019   1. Mild vascular congestion increased. 2. Right basilar airspace disease either atelectasis or infiltrate increased.       Will Bonnet, MD  10/09/2019 8:32 AM

## 2019-10-09 NOTE — Progress Notes (Addendum)
[facility]- Medical Critical Care Service [facility]) Progress Note        Date / Time: 12/23/201:45 PM Room:   W311/W311-A  Patient:   CARZELL, MIGNEAULT   Admitted: 09/30/2019  Attending:   Elvera Lennox, MD   Status: Full Code     Critical Care PROGRESS Note --  Hospital Day /  LOS: 9 days           Assessment    Ransome Benedix is a 63 y.o. male with cardiac arrest      Last 24 Hrs     Transferred to [facility]    Hospital Course     10/07/2019: transferred to [facility]  10/08/2019: no new issues  10/09/2019: myocardial perfusion study    Plan     Critical Care support by organ system:    NEURO: s/p cardiac arrest: patient has had remarkable neurologic recovery following TTM. He is awake and alert though speech remains slow and he remains incontinent. Will continue Provigil and Keppra    CV:   Cardiac Arrest:  Echo apparently has a normal EF! Nuclear stress test in progress then a decision about a possible cardiac cath. BNP remains elevated. Will increase lasix to twice daily.  CAD: history of CABG.   Hypotension: resolved. Will D/C midodrine and begin an ARB    PULM:   Wheezing: patient is a former smoker. Will begin Duonebs though it may be cardiac asthma    GI: patient has passed swallow evaluation. Will continue pureed diet  GI UVO:ZDGU  Nutrition: pureed diet    RENAL: AKI: patient has had remarkable improvement in renal function but appears to have baseline CKD.    ID:   Serratia marcescens pneumonia/ UTI due to ? Organism: on Zosyn    HEME: H/H stable  SCD's in place.   Pharmacologic ppx: is safe    ENDO/RHEUM:   DM: on accuchecks  TSH: 1.3    Lines/Foley:     Foley: none    Code Status: Full Code  Patient is currently is able to make medical decisions.      Patients designated proxy is not present.    Discussed current diagnoses, prognosis and goals of care.     Disposition: [facility]    Consultants: cardiology, neurology, ID     Discussed with [facility] who will likely transfer patient to [facility] once bed is  available.      Physical Exam   BP 181/82    Pulse 89    Temp 98.7 F (37.1 C) (Temporal)    Resp 21    Ht 1.803 m (5\' 11" )    Wt 80.2 kg (176 lb 12.9 oz)    SpO2 95%    BMI 24.66 kg/m    General Appearance:  chronically ill appearing  Mental status:  Awake and alert  Neuro:  neck supple without rigidity  Lungs: bilateral wheezes anteriorly  Cardiac: normal rate, regular rhythm, normal S1, S2, no murmurs, rubs, clicks or gallops  Abdomen:  soft, nontender, nondistended, no masses or organomegaly  Extremities: peripheral pulses normal, no pedal edema, no clubbing or cyanosis   Skin:   normal coloration and turgor, no rashes, no suspicious skin lesions noted  Other:       Data / Meds / Labs / Rads     Current Facility-Administered Medications   Medication Dose Route Frequency    albuterol-ipratropium  3 mL Nebulization Q6H SCH    aspirin  81 mg Oral  Daily    atorvastatin  40 mg Oral Daily    docusate  100 mg Oral Daily    enoxaparin  40 mg Subcutaneous Q24H SCH    ferrous sulfate  324 mg Oral QAM W/BREAKFAST    furosemide  20 mg Intravenous BID    insulin lispro  1-3 Units Subcutaneous QHS    insulin lispro  1-5 Units Subcutaneous TID AC    lactobacillus/streptococcus  1 capsule Oral Daily    levETIRAcetam  250 mg Intravenous Q12H SCH    modafinil  100 mg per OG tube Daily    piperacillin-tazobactam  4.5 g Intravenous Q8H          Intake/Output Summary (Last 24 hours) at 10/09/2019 1345  Last data filed at 10/09/2019 1000  Gross per 24 hour   Intake 957 ml   Output 1800 ml   Net -843 ml     Recent Labs     10/09/19  0513 10/08/19  0425 10/07/19  0346   WBC 8.01 7.17 7.54   Hgb 8.5* 8.2* 8.1*   Hematocrit 26.1* 25.0* 24.2*     Recent Labs     10/09/19  0513 10/08/19  0425 10/07/19  0346   Sodium 143 144 144   Potassium 4.1 4.4 4.1   Chloride 111 113* 112*   CO2 22 22 22    BUN 28.4* 35.7* 41.2*   Creatinine 1.6* 1.5* 1.6*   Glucose 150* 144* 79   Calcium 8.3* 8.4* 8.4*   Magnesium 1.6 1.8 1.8   Phosphorus  2.3 2.0* 2.3     Recent Labs     10/08/19  0425   PT 13.6   PT INR 1.1     XR Chest AP Portable   Final Result       1. Mild vascular congestion increased.   2. Right basilar airspace disease either atelectasis or infiltrate   increased.                  Will Bonnet, MD    10/09/2019 8:32 AM      Echocardiogram Adult Complete W Color Doppler Waveform   Final Result      XR Chest AP Portable   Final Result    Diminished bibasilar atelectasis.      Prince Solian, MD    10/08/2019 8:00 AM      XR Chest AP Portable   Final Result         1. Left IJ approach central venous catheter with tip likely in the left   innominate vein.   2. Slightly increasing right lung base opacities compared to prior.      Jorge Mandril, MD    10/06/2019 9:23 AM      CT Head WO Contrast   Final Result       1. No acute intracranial abnormality is identified.   2. Chronic ischemic changes.      Nicoletta Dress, MD    10/03/2019 6:28 PM      US Renal Kidney Bladder Complete   Final Result    No hydronephrosis.      Rocky Crafts, MD    10/02/2019 10:13 PM      XR Chest AP Portable   Final Result         Lines in good position. Hypoventilation with left basilar atelectasis.   No significant consolidation or pneumothorax.      Charlott Rakes, MD    09/30/2019 10:56 PM  Chest AP Portable   Final Result    Bilateral infiltrates suspicious for pulmonary edema.                  Will Bonnet, MD    09/30/2019 3:26 PM      Temporary Pacer Insertion    (Results Pending)   NM Myocardial Perfusion Spect (Stress And Rest)    (Results Pending)   XR Chest AP Portable    (Results Pending)     _____________________________________________________________________    This patient has a high probability of sudden clinically significant deterioration which requires the highest level of physician preparedness to intervene urgently. I managed/supervised life or organ supporting interventions that required frequent physician assessments. I devoted my full  attention in the ICU to the direct care of this patient for this period of time.  Organ systems that require intensive critical care support  are described in more detail in the note assessment and plan above.     Any critical care time was performed today and is exclusive of teaching, billable procedures, and not overlapping with any other providers.     Critical care time: 35 minutes.    Signed by: Elvera Lennox  10/09/2019 1:45 PM    ZO:[id number], No Family (Inactive)

## 2019-10-09 NOTE — Respiratory Progress Note (Signed)
Ask3Teach3 Program    Education about New Medications and their Side effects    Dear Keith Gates,    Its been a pleasure taking care of you during your hospitalization here at [facility]. We have initiated a new program to educate our patients and/or their family members or designated personnel about the new medications started by your physicians and their indications along with the possible side effects. Multiple studies have shown that patients started on new medications are often unaware of the names of the medication along with the indications and their side effects which leads to decreased compliance with the medications.    During our conversation today on 10/09/2019  3:06 PM I have explained to you the name of the new medication and the indication along with some possible common side effects. Listed below are some of the new medications started during this hospitalization.     Please call the Nurse if you have any side effects while in hospital.     Please call 911 if you have any life threatening symptoms after you are discharged from the hospital.    Please inform your Primary care physician for common side effects which are not life threatening after discharge.    Duoneb (Albuterol/Atrovent)    Medication:Albuterol(Ventolin/Proventil)   This Medication is used for:   COPD   Asthma   Bronchospasm    Common Side Effects are:   Headache   Tremor   Nervousness   Sleeplessness    A note from your nurse:  Call your nurse immediately if you notice itching, hives, swelling or trouble breathing     Medication: Ipratropium(Atrovent)   This Medication is used for:   COPD   Asthma   Bronchospasm    Common Side Effects are:   Dry mouth   Headache   Nausea   High heart rate    A note from your nurse:  Call your nurse immediately if you notice itching, hives, swelling or trouble breathing           Thank you for your time.    [place], [place], RRT-NPS  10/09/2019  3:06 PM  [facility]  [address]  [place], [place]  [address]

## 2019-10-09 NOTE — OT Progress Note (Signed)
Occupational Therapy Cancellation Note    Patient: Keith Gates  UJW:[id number]    Unit: N562/Z308-M    Patient not seen for occupational therapy secondary to Patient out of room at Nuclear Medicine at time of attempt. P: Will continue to follow.     Tennis Ship. Trixie Deis, MS,OTR/L  Pager # [phone]  [phone]

## 2019-10-09 NOTE — Progress Notes (Signed)
WOC Nurse Wound Consult:    Keith Gates is a 63 y.o. male admitted with heart block    Reason for consult: evaluation of wound LUQ abdomen    Past Medical History:   Diagnosis Date    Diabetes mellitus     Elevated cholesterol     Hypertension     Stroke      Past Surgical History:   Procedure Laterality Date    CORONARY ARTERY BYPASS GRAFT  01/2018    TEMPORARY PACER INSERTION N/A 09/30/2019    Procedure: TEMPORARY PACER INSERTION;  Surgeon: Josph Macho I, MD;  Location: [facility] CARDIAC CATH/EP;  Service: Cardiovascular;  Laterality: N/A;     General assessment:    Mobility: with assistance    Braden score: 16    Bed: Versa Care    Diet: dysphagia pureed, honey thicken liquids, NS restriction, no added salt    Wound assessment:  Location: abdomen, left upper quadrant  Etiology: unknown  Wound base: small area of hypergranulation  Measurements: 0.2 cm x 0.2 cm , above skin level  Edges: attached  Periwound: intact  Exudate amount and type: scant serosanguinous drainage when cleansed  Odor: none  Pain: pt sedated, intubated  Tunneling/undermining: none  Exposed tendon/ligament, muscle, or bone: none    Treatment/Interventions:    Dressing change completed to left upper abdominal wound per orders with application of non adherent dressing, skin prep to periwound skin and Optifoam dressing. Wound cleansed with NS prior to dressing application.    Plan:    Area of hypergranulation tissue of unknown etiology. Recommend application of non adherent dressing and foam over wound for protection and moist wound healing. Skin prep for protection of periwound skin.     Recommend turning/repositioning as medically appropriate, offloading patient's heels and use of TAP system for pressure ulcer prevention.    WOC team will continue to monitor patient for wound healing needs.    Lesly Rubenstein, RN, [facility]  Wound Care Coordinator  Spectra link # [phone]

## 2019-10-09 NOTE — SLP Plan of Care Note (Signed)
[facility]  Speech Language Pathology Cancellation Note      Patient:  Keith Gates MRN#:  [id number]  Unit:  [facility] Room/Bed:  V409/W119-J      Patient not seen for MBSS today due to NPO for procedure. Will attempt after. D/W RN.    Selma Mink L. Granville Lewis M.S. CCC/SLP, CBIS  Speech Language Pathologist  [facility]  [phone]    10/09/2019

## 2019-10-09 NOTE — Progress Notes (Signed)
Case Manager talked to Parks with Mikael Spray at [phone] regarding this patient and gave updated information.  CM provided [facility] covering doctor and unit phone number to [place].  She told the CM that she will let the [facility] physician call the [facility] physician to discuss about this patient.  She will call the [facility] hospital for bed availability and let the CM know about transfer.  CM will follow up as needed.     Jonetta Osgood, RN MSN ACM  Clinical Case Manager II  [phone]

## 2019-10-09 NOTE — Progress Notes (Signed)
Infectious Disease            Progress Note    10/09/2019   Cortland Seme [id number] is a 63 y.o. male, history of diabetes mellitus, hypertension, hyperlipidemia, stroke, coronary artery disease status post coronary artery bypass grafting who was admitted after cardiac arrest, CPR, with sepsis, UTI, possible pneumonia, aspiration.    Subjective:     [name] today Symptoms: Afebrile, denies any vomiting or diarrhea. Other review of system is non contributory.    Objective:     Blood pressure (!) 176/94, pulse 89, temperature 97.9 F (36.6 C), temperature source Temporal, resp. rate 18, height 1.803 m (5\' 11" ), weight 80.2 kg (176 lb 12.9 oz), SpO2 94 %.    General Appearance:  Looks comfortable  HEENT: Pallor positive, Anicteric sclera.   Neck: Supple  Lungs:Decreased breath sound at bases.   Chest Wall: Symmetric chest wall expansion.   Heart : S1 and S2.   Abdomen: Abdomen is soft, bowel sounds positive.  Neurological:  Awake and responsive, moves all extremities    Laboratory And Diagnostic Studies:     Recent Labs     10/09/19  0513 10/08/19  0425   WBC 8.01 7.17   Hgb 8.5* 8.2*   Hematocrit 26.1* 25.0*   Platelets 158 125*   Neutrophils 64.4 61.0     Recent Labs     10/09/19  0513 10/08/19  0425   Sodium 143 144   Potassium 4.1 4.4   Chloride 111 113*   CO2 22 22   BUN 28.4* 35.7*   Creatinine 1.6* 1.5*   Glucose 150* 144*   Calcium 8.3* 8.4*     Recent Labs     10/09/19  0513 10/08/19  0425   AST (SGOT) 53* 49*   ALT 65* 57*   Alkaline Phosphatase 210* 183*   Protein, Total 5.8* 5.9*   Albumin 2.6* 2.3*   Bilirubin, Total 0.5 0.5       Current Med's:     Current Facility-Administered Medications   Medication Dose Route Frequency    albuterol-ipratropium  3 mL Nebulization Q6H SCH    aspirin  81 mg Oral Daily    atorvastatin  40 mg Oral Daily    docusate  100 mg Oral Daily    enoxaparin  40 mg Subcutaneous Q24H SCH    ferrous sulfate  324 mg Oral QAM W/BREAKFAST     furosemide  20 mg Intravenous BID    insulin lispro  1-3 Units Subcutaneous QHS    insulin lispro  1-5 Units Subcutaneous TID AC    lactobacillus/streptococcus  1 capsule Oral Daily    levETIRAcetam  250 mg Intravenous Q12H SCH    losartan  50 mg Oral Daily    modafinil  100 mg per OG tube Daily    piperacillin-tazobactam  4.5 g Intravenous Q8H       Lines/Drains:     Patient Lines/Drains/Airways Status    Active Lines, Drains and Airways     Name:   Placement date:   Placement time:   Site:   Days:    Peripheral IV 10/08/19 22 G Anterior;Left Upper Arm   10/08/19    1140    Upper Arm   1    Peripheral IV 10/08/19 22 G Anterior;Right Upper Arm   10/08/19    1145    Upper Arm   1    External Urinary Catheter   10/08/19    1400    --  1                Assessment:      Condition: Guarded   Sepsis; shock   Cardiac arrest status post CPR   Respiratory failure status post extubation   UTI   Pneumonia; possible aspiration   History of stroke   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 Grammatical errors, random word insertions, deletions, pronoun errors and incomplete sentences are occasional consequences of this technology due to software limitations. Not all errors are caught or corrected. If there are questions or concerns about the content of this note or information contained within the body of this dictation they should be addressed directly with the author for  clarification

## 2019-10-10 ENCOUNTER — Inpatient Hospital Stay: Payer: PRIVATE HEALTH INSURANCE

## 2019-10-10 LAB — COMPREHENSIVE METABOLIC PANEL
ALT: 80 U/L — ABNORMAL HIGH (ref 0–55)
AST (SGOT): 66 U/L — ABNORMAL HIGH (ref 5–34)
Albumin/Globulin Ratio: 0.8 — ABNORMAL LOW (ref 0.9–2.2)
Albumin: 2.8 g/dL — ABNORMAL LOW (ref 3.5–5.0)
Alkaline Phosphatase: 206 U/L — ABNORMAL HIGH (ref 38–106)
Anion Gap: 14 (ref 5.0–15.0)
BUN: 25 mg/dL (ref 9.0–28.0)
Bilirubin, Total: 0.5 mg/dL (ref 0.2–1.2)
CO2: 21 mEq/L — ABNORMAL LOW (ref 22–29)
Calcium: 8.6 mg/dL (ref 8.5–10.5)
Chloride: 108 mEq/L (ref 100–111)
Creatinine: 1.7 mg/dL — ABNORMAL HIGH (ref 0.7–1.3)
Globulin: 3.4 g/dL (ref 2.0–3.6)
Glucose: 196 mg/dL — ABNORMAL HIGH (ref 70–100)
Potassium: 3.9 mEq/L (ref 3.5–5.1)
Protein, Total: 6.2 g/dL (ref 6.0–8.3)
Sodium: 143 mEq/L (ref 136–145)

## 2019-10-10 LAB — CBC AND DIFFERENTIAL
Absolute NRBC: 0 10*3/uL (ref 0.00–0.00)
Basophils Absolute Automated: 0.05 10*3/uL (ref 0.00–0.08)
Basophils Automated: 0.5 %
Eosinophils Absolute Automated: 0.25 10*3/uL (ref 0.00–0.44)
Eosinophils Automated: 2.7 %
Hematocrit: 24.7 % — ABNORMAL LOW (ref 37.6–49.6)
Hgb: 8.2 g/dL — ABNORMAL LOW (ref 12.5–17.1)
Immature Granulocytes Absolute: 0.05 10*3/uL (ref 0.00–0.07)
Immature Granulocytes: 0.5 %
Lymphocytes Absolute Automated: 1.51 10*3/uL (ref 0.42–3.22)
Lymphocytes Automated: 16.5 %
MCH: 28.8 pg (ref 25.1–33.5)
MCHC: 33.2 g/dL (ref 31.5–35.8)
MCV: 86.7 fL (ref 78.0–96.0)
MPV: 11.5 fL (ref 8.9–12.5)
Monocytes Absolute Automated: 0.88 10*3/uL — ABNORMAL HIGH (ref 0.21–0.85)
Monocytes: 9.6 %
Neutrophils Absolute: 6.4 10*3/uL — ABNORMAL HIGH (ref 1.10–6.33)
Neutrophils: 70.2 %
Nucleated RBC: 0 /100 WBC (ref 0.0–0.0)
Platelets: 182 10*3/uL (ref 142–346)
RBC: 2.85 10*6/uL — ABNORMAL LOW (ref 4.20–5.90)
RDW: 16 % — ABNORMAL HIGH (ref 11–15)
WBC: 9.14 10*3/uL (ref 3.10–9.50)

## 2019-10-10 LAB — B-TYPE NATRIURETIC PEPTIDE: B-Natriuretic Peptide: 1440.1 pg/mL — ABNORMAL HIGH (ref 0.0–100.0)

## 2019-10-10 LAB — APTT: PTT: 28 s (ref 23–37)

## 2019-10-10 LAB — GLUCOSE WHOLE BLOOD - POCT: Whole Blood Glucose POCT: 210 mg/dL — ABNORMAL HIGH (ref 70–100)

## 2019-10-10 LAB — PHOSPHORUS: Phosphorus: 2.7 mg/dL (ref 2.3–4.7)

## 2019-10-10 LAB — MAGNESIUM: Magnesium: 1.6 mg/dL (ref 1.6–2.6)

## 2019-10-10 LAB — GFR: EGFR: 41.1

## 2019-10-10 MED ORDER — MAGNESIUM SULFATE IN D5W 1-5 GM/100ML-% IV SOLN
1.0000 g | INTRAVENOUS | Status: AC
Start: 2019-10-10 — End: 2019-10-10
  Administered 2019-10-10 (×2): 1 g via INTRAVENOUS
  Filled 2019-10-10 (×2): qty 100

## 2019-10-10 MED ORDER — HEPARIN SODIUM (PORCINE) 5000 UNIT/ML IJ SOLN
80.00 [IU]/kg | INTRAMUSCULAR | Status: DC | PRN
Start: 2019-10-10 — End: 2019-10-10

## 2019-10-10 MED ORDER — HEPARIN SODIUM (PORCINE) 5000 UNIT/ML IJ SOLN
40.00 [IU]/kg | INTRAMUSCULAR | Status: DC | PRN
Start: 2019-10-10 — End: 2019-10-10

## 2019-10-10 MED ORDER — HEPARIN (PORCINE) IN D5W 50-5 UNIT/ML-% IV SOLN (UNITS/KG/HR ONLY)
18.00 [IU]/kg/h | INTRAVENOUS | Status: DC
Start: 2019-10-10 — End: 2019-10-10
  Administered 2019-10-10: 03:00:00 18 [IU]/kg/h via INTRAVENOUS
  Filled 2019-10-10: qty 500

## 2019-10-10 NOTE — Progress Notes (Signed)
0732: Report called to [facility], Maryla Morrow, RN. All questions answered.     1610: Spoke to patient's wife via phone w/ thy phone interpreter. Made aware of transfer and all questions answered.     0912:   - PTS at bedside, patient introduced. Patient will go with 2 [place] pumps due to running medications, PTS doesn't have pumps of their own, so they will return pumps (serial ID: [id number] and [id number]).   - All belongings at bedside packed and being sent w/ patient.   - ID obtained from security safe, returned to patient.     9:18: Patient off floor to [facility].

## 2019-10-10 NOTE — Discharge Summary (Signed)
[facility] ICU/IMC  Hospital Discharge Summary      Patient Name: Keith Gates, Keith Gates  Date/Time: 10/10/19 7:11 AM  Accepting Physician/Service: [facility]   Primary Care Physician: Doctor, No Family (Inactive)  Location/Room: W295/A213-Y     Treatment Team: Treatment Team:   Attending Provider: Elvera Lennox, MD  Consulting Physician: Romualdo Bolk, MD  Consulting Physician: Alfonzo Beers, MD  Consulting Physician: Gwendalyn Ege, MD    Diagnoses:     Active Hospital Problems    Diagnosis    Iron deficiency anemia, unspecified iron deficiency anemia type    Heart block    Cardiac arrest       Discharge Medications      Kaydn, Shim   Home Medication Instructions [id number]    Printed on:10/10/19 4132   Medication Information                      atorvastatin (LIPITOR) 40 MG tablet  Take 40 mg by mouth daily             metFORMIN (GLUCOPHAGE) 1000 MG tablet  Take 1,000 mg by mouth 2 (two) times daily with meals             midodrine (PROAMATINE) 5 MG tablet  Take 5 mg by mouth 2 (two) times daily with meals             SITagliptin (JANUVIA) 100 MG tablet  Take 100 mg by mouth daily                 ICU History/Plan by System:   Critical Care support by organ system:    NEURO: s/p cardiac arrest: patient has had remarkable neurologic recovery following TTM. He is awake and alert though speech remains slow and he remains incontinent. Will continue Provigil and Keppra    CV:   Cardiac Arrest:  Echo apparently has a normal EF! Nuclear stress test is done on 12/23   ( pending report)  then a decision about a possible cardiac cath. BNP remains elevated. Will increase lasix to twice daily.  CAD: history of CABG.   Hypotension: resolved. On  ARB  -Rt cephalic thrombosis: started on hep gtt this am.    PULM:   Wheezing: patient is a former smoker. On  Duonebs though it may be cardiac asthma    GI: patient has passed swallow evaluation. On  pureed diet  GI GMW:NUUV  Nutrition: pureed  diet    RENAL: AKI: patient has had remarkable improvement in renal function but appears to have baseline CKD.    ID:   Serratia marcescens pneumonia/ UTI : on Zosyn    HEME:   Hgb is stable  SCD's in place.   Pharmacologic ppx: is safe    ENDO/RHEUM:   DM: on accuchecks  TSH: 1.3    Lines/Foley:     Foley: none    Code Status: Full Code  Patient is currently  able to make medical decisions.         Disposition: [facility]HC    Consultants: cardiology, neurology, ID                Follow-up/Monitoring           Signature/Attestation   Discharge Billing: Death/Discharge >30 min (25366)    Signed by: Arthur Holms, MD   YQ:[id number], No Family (Inactive)

## 2019-10-10 NOTE — Progress Notes (Signed)
[facility]  PROGRESS NOTE  [facility]    Date Time: 10/10/19 8:54 AM  Patient Name: Keith Gates       Patient Active Problem List   Diagnosis    Non-STEMI (non-ST elevated myocardial infarction)    DKA (diabetic ketoacidoses)    Heart block    Cardiac arrest    Iron deficiency anemia, unspecified iron deficiency anemia type       Assessment:      Cardiac arrest, cause not clear, with subsequent complete heart block in the setting of metabolic derangements, pyuria, ARF, leukocytosis, septic shock, PNA   Mildly abnl nuc stress 10/09/2019 - small perfusion defect in anterior wall which is partially reversible. There is a perfusion defect in inferior wall which is fixed. There is otherwise normal perfusion. EF 49% (sig symptoms from Lexiscan-> nausea vomiting)   Echo 10/08/2019 normal EF, mod LVH   Temporary Pacing wire inserted12/14/20by Dr.Lee, removed 10/03/19   Per EP, as pacing support was no longer required, low suspicion that bradyarrhythmia or heart block/primary arrhythmia was cause of arrest   S/p s/p arctic sun therapeutic hypothermia protocol with good neuro recovery, extubated 10/05/19.  Memory impairment.   CAD status post CABG 01/2019 x 4 vessel (details not readily available)   Ischemic CMP with LVEF 20% by echo April 2019   COVID negative   AKI on CKD2, Cr 1.5->1.7   DM   HTN   Prior CVA   HLD   COVID negative    Recommendations:    Cont asa 81, lipitor 40, labetalol 100 bid   Ween down on diuretics lasix 20 IV BID-> 20 IV qD his Cr is going up. Appears dry on exam.   He has mild ischemia on the nuc stress from yesterday and a small inferior scar. Will tx his CAD medically. No symptoms of CP or sob this am   Pt transferring to [facility] care and another hospital.        Medications:      Scheduled Meds: PRN Meds:    albuterol-ipratropium, 3 mL, Nebulization, Q6H SCH  aspirin, 81 mg, Oral, Daily  atorvastatin, 40 mg, Oral, Daily  docusate, 100 mg, Oral,  Daily  ferrous sulfate, 324 mg, Oral, QAM W/BREAKFAST  furosemide, 20 mg, Intravenous, BID  insulin lispro, 1-3 Units, Subcutaneous, QHS  insulin lispro, 1-5 Units, Subcutaneous, TID AC  labetalol, 100 mg, Oral, Q12H [facility]  lactobacillus/streptococcus, 1 capsule, Oral, Daily  levETIRAcetam, 250 mg, Intravenous, Q12H SCH  modafinil, 100 mg, per OG tube, Daily  piperacillin-tazobactam, 4.5 g, Intravenous, Q8H        Continuous Infusions:   heparin infusion 25,000 units/500 mL (VTE/Moderate Intensity) 18 Units/kg/hr (10/10/19 0246)    sodium chloride, , PRN  dextrose, 15 g of glucose, PRN    And  dextrose, 12.5 g, PRN  heparin (porcine), 40 Units/kg, PRN  heparin (porcine), 80 Units/kg, PRN  ondansetron, 4 mg, Q8H PRN              Subjective:   No acute events o/n.   No chest pain or sob      Physical Exam:     Vitals:    10/10/19 0818   BP:    Pulse: 89   Resp: 14   Temp:    SpO2: 94%     Temp (24hrs), Avg:98.5 F (36.9 C), Min:97.9 F (36.6 C), Max:99.1 F (37.3 C)      Telemetry reviewed no changes.     Intake  and Output Summary (Last 24 hours) at Date Time    Intake/Output Summary (Last 24 hours) at 10/10/2019 0854  Last data filed at 10/10/2019 0848  Gross per 24 hour   Intake 1434.5 ml   Output 3125 ml   Net -1690.5 ml       General Appearance:  Breathing comfortable, no acute distress  Head:  normocephalic  Eyes:  EOM's intact  Neck:  No carotid bruit or jugular venous distension, brisk carotid upstroke  Lungs:  Clear to auscultation throughout, no wheezes, rhonchi or rales, good respiratory effort   Chest Wall:  No tenderness or deformity  Heart:  S1, S2 normal, no S3, no S4, no murmur, PMI not displaced, no rub   Abdomen:  Soft, non-tender, positive bowel sounds  Extremities:  No cyanosis, clubbing or edema  Pulses:  Equal radial pulses, 4/4 symmetric  Neurologic:  Alert and oriented x3, mood and affect normal  Musculoskeletal: normal inspection    Labs:             Recent Labs   Lab 10/04/19  0412    Triglycerides 84     Recent Labs   Lab 10/10/19  0236   Bilirubin, Total 0.5   Protein, Total 6.2   Albumin 2.8*   ALT 80*   AST (SGOT) 66*     Recent Labs   Lab 10/10/19  0236   Magnesium 1.6     Recent Labs   Lab 10/10/19  0236 10/08/19  0425   PT  --  13.6   PT INR  --  1.1   PTT 28  --      Recent Labs   Lab 10/10/19  0618 10/09/19  0513 10/08/19  0425   WBC 9.14 8.01 7.17   Hgb 8.2* 8.5* 8.2*   Hematocrit 24.7* 26.1* 25.0*   Platelets 182 158 125*     Recent Labs   Lab 10/10/19  0236 10/09/19  0513 10/08/19  0425   Sodium 143 143 144   Potassium 3.9 4.1 4.4   Chloride 108 111 113*   CO2 21* 22 22   BUN 25.0 28.4* 35.7*   Creatinine 1.7* 1.6* 1.5*   EGFR 41.1 44.1 47.5   Glucose 196* 150* 144*   Calcium 8.6 8.3* 8.4*     Recent Labs   Lab 10/07/19  0346   TSH 1.30       .  Lab Results   Component Value Date    BNP 1,440.1 (H) 10/10/2019      Estimated Creatinine Clearance: 48.6 mL/min (A) (based on SCr of 1.7 mg/dL (H)).    Weight Monitoring 10/03/2019 10/04/2019 10/05/2019 10/06/2019 10/07/2019 10/08/2019 10/09/2019   Height - - - - - - -   Height Method - - - - - - -   Weight 80.7 kg 80.8 kg 83.5 kg 81.3 kg 79.3 kg 80.2 kg 88.1 kg   Weight Method Bed Scale Bed Scale Bed Scale Bed Scale Bed Scale Bed Scale Bed Scale   BMI (calculated) - - - - - - -         Imaging:   Radiological Procedure reviewed.              Signed by: Samantha Crimes, MD        [facility]  NP Spectralink [phone] (8am-5pm)  MD Spectralink [phone] (8am-5pm)  After hours, non urgent consult line [phone]  After Hours, urgent consults or to reach the  on-call MD [phone]

## 2019-10-10 NOTE — Progress Notes (Signed)
10/10/19 1017   Discharge Disposition   Physical Discharge Disposition Another Acute Hospital   Receiving facility, unit and room number: [facility]   Mode of Transportation Ambulance  [facility] arranged transportation)   Patient/Family/POA notified of transfer plan   (physician has talked to the patient and his wife and they both agree with the transfer)   Bedside nurse notified of transport plan? Yes

## 2019-10-10 NOTE — Plan of Care (Signed)
Call for patient with mg 1.6. Repletion ordered.

## 2019-10-10 NOTE — Plan of Care (Signed)
Pt has a rt cephalic DVT, AKI and prior thrombocytopenia. Heparin infusion ordered

## 2019-10-15 NOTE — Retrospective Coding Query (Signed)
PHYSICIAN'S DOCUMENTATION                                                                      REQUEST                                                                         Date of Request:  10/15/2019  Type of Request:  DOCUMENTATION CLARIFICATION                                         Patient Name: Keith Gates, Keith Gates  Account #: [id number]  MR #: [id number]  Discharge Date: 10/10/2019      Dear Dr. Lanora Manis,     The patient was admitted with a cardiac arrest with subsequent complete heart block in the setting of metabolic derangements, pyuria, ARF, leukocytosis, septic shock, PNA. He was intubated and treated with IV antibiotics. Several progress notes state cardiac arrest with complete heart block in the setting of sepsis with septic shock from pneumonia and a UTI but this was not brought forward to the discharge summary.        Question to Physician:     Please clarify the status of sepsis with septic shock for this patient encounter.     _X_ Sepsis with septic shock ruled in  ___ Sepsis with septic shock ruled out  ___ Other, please specify __________  ___ Ewing Schlein to determine              PHYSICIAN RESPONSE:                          Coder  Avon Gully  Date     10/14/2019  [email or web address]  If you need further assistance, please call [phone]

## 2021-07-07 DIAGNOSIS — I2510 Atherosclerotic heart disease of native coronary artery without angina pectoris: Secondary | ICD-10-CM | POA: Insufficient documentation

## 2021-07-07 NOTE — Progress Notes (Signed)
Patient referred by [name], MD for coronary artery disease  Subjective:   John Moyer, male    DOB: 14-Aug-1956, 65 y.o.   MRN: [id number]   Chief Complaint  Patient presents with   Pre-op Exam    Clearance for Coloscopy   New Patient (Initial Visit)    Referred by Dr. Annamaria Boots @ [facility]   Coronary Artery Disease     HPI  65 y.o. Caucasian male with coronary artery disease s/p CABG, type 2 diabetes mellitus, h/o stroke, former smoker  Patient is originally from [place], moved to [place] in January 2022. Patient is here with his wife today.  Patient had extremely poor historian, so is difficult to piece history together.  It sounds like patient has had a stroke couple years ago.  In December 2020 or 2021 (there is disagreement between patient and his wife), patient underwent coronary bypass surgery.  Either before or after the surgery, wife tells me that patient "died for 6 minutes", EMS were called and he was hospitalized locally and then transferred to "[facility]" in [place], [place].  He reportedly had a kidney infection.  Again, details are very limited.  Currently, patient ambulates using a walker.  Activities limited to walking around the house and on the deck.  With this level of activity, denies any complaints of chest pain, shortness of breath, residual significant weakness from previous stroke etc.  Patient is going to undergo colonoscopy.  He could not tell me whether this was a screening colonoscopy or because of any GI symptoms.  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 No pertinent family history.   Current Outpatient Medications on File Prior to Visit  Medication Sig Dispense Refill   aspirin EC 81 MG tablet Take 81 mg by mouth daily. Swallow whole.     atorvastatin (LIPITOR) 80 MG tablet Take 80 mg by mouth daily.     carvedilol (COREG) 3.125 MG tablet Take 3.125 mg by mouth 2 (two) times daily with a meal.     glipiZIDE (GLUCOTROL) 5 MG tablet Take 5 mg by mouth daily before breakfast.     No current facility-administered medications on file prior to visit.    Cardiovascular and other pertinent studies:  Scheduled  In office pacemaker check 07/08/21  Single (S)/Dual (D)/BV: D. Presenting Sinus rhythm, V paced. A13%, V49% Pacemaker dependant:  No AMS Episodes None recent HVR None Longevity 8 Years. Magnet rate: >85%. Lead measurements: Stable. Thoracic impedance: NA. Histogram: Low (L)/normal (N)/high (H)  N.   Observations: Indication not known. Placed elsewhere. No records available   EKG 07/08/2021: Sinus rhythm 74 bpm Ventricular paced rhythm   Recent labs: Not available Reportedly performed with PCP in 05/2021   Review of Systems  Cardiovascular:  Negative for chest pain, dyspnea on exertion, leg swelling, palpitations and syncope.        Vitals:   07/08/21 0852  BP: 115/65  Pulse: 74  Resp: 16  Temp: 98.3 F (36.8 C)  SpO2: 97%     Body mass index is 25.94 kg/m. Filed Weights   07/08/21 0852  Weight: 186 lb (84.4 kg)     Objective:   Physical Exam Vitals and nursing note reviewed.  Constitutional:      General: He is not in acute distress. Neck:     Vascular: No JVD.  Cardiovascular:     Rate and Rhythm: Normal rate and regular rhythm.     Pulses: Normal pulses.     Heart sounds: Normal  heart sounds. No murmur heard. Pulmonary:     Effort: Pulmonary effort is normal.     Breath sounds: Normal breath sounds. No wheezing or rales.  Chest:     Comments: Scars of sternotomy and left upper pectoral device placement Musculoskeletal:     Right lower leg: No edema.     Left lower leg: No edema.         Assessment & Recommendations:   65 y.o. Caucasian male with coronary artery disease s/p CABG, type 2 diabetes mellitus, h/o stroke, former smoker  CAD: S/p CABG, s/p pacemaker placement.  Indication not known, prior records not available Details of physical exam are normal.  We will get records. Will obtain echocardiogram. Will get baseline labs from PCP. Continue aspirin, statin, carvedilol.  Further recommendations after above testing and obtaining prior labs and records.  Thank you for referring the patient to [place]. Please feel free to contact with any questions.   Nigel Mormon, MD Pager: [phone] Office: [phone]

## 2021-07-08 ENCOUNTER — Encounter: Payer: Self-pay | Admitting: Cardiology

## 2021-07-08 ENCOUNTER — Ambulatory Visit: Payer: Self-pay | Admitting: Cardiology

## 2021-07-08 ENCOUNTER — Other Ambulatory Visit: Payer: Self-pay

## 2021-07-08 VITALS — BP 115/65 | HR 74 | Temp 98.3°F | Resp 16 | Ht 71.0 in | Wt 186.0 lb

## 2021-07-08 DIAGNOSIS — I2510 Atherosclerotic heart disease of native coronary artery without angina pectoris: Secondary | ICD-10-CM

## 2021-07-08 DIAGNOSIS — Z45018 Encounter for adjustment and management of other part of cardiac pacemaker: Secondary | ICD-10-CM

## 2021-07-21 ENCOUNTER — Other Ambulatory Visit: Payer: Self-pay

## 2021-07-22 ENCOUNTER — Other Ambulatory Visit: Payer: Self-pay

## 2021-07-22 ENCOUNTER — Ambulatory Visit: Payer: Self-pay

## 2021-07-22 DIAGNOSIS — I2510 Atherosclerotic heart disease of native coronary artery without angina pectoris: Secondary | ICD-10-CM

## 2021-07-24 ENCOUNTER — Other Ambulatory Visit: Payer: Self-pay | Admitting: Cardiology

## 2021-07-24 ENCOUNTER — Encounter: Payer: Self-pay | Admitting: Cardiology

## 2021-07-24 DIAGNOSIS — I442 Atrioventricular block, complete: Secondary | ICD-10-CM

## 2021-07-24 DIAGNOSIS — Z950 Presence of cardiac pacemaker: Secondary | ICD-10-CM

## 2021-07-24 DIAGNOSIS — I495 Sick sinus syndrome: Secondary | ICD-10-CM

## 2021-07-24 DIAGNOSIS — E7800 Pure hypercholesterolemia, unspecified: Secondary | ICD-10-CM

## 2021-07-24 HISTORY — DX: Presence of cardiac pacemaker: Z95.0

## 2021-07-24 HISTORY — DX: Atrioventricular block, complete: I44.2

## 2021-07-24 MED ORDER — EZETIMIBE 10 MG PO TABS: 10.0000 mg | ORAL_TABLET | Freq: Every day | ORAL | 3 refills | Status: DC

## 2021-07-27 ENCOUNTER — Ambulatory Visit: Payer: 59 | Admitting: Podiatry

## 2021-08-05 ENCOUNTER — Telehealth: Payer: Self-pay

## 2021-08-05 NOTE — Telephone Encounter (Signed)
Called patient, he said that he did not have his transmitter plugged in, but would plug it in today and also transmit.

## 2021-08-06 NOTE — Telephone Encounter (Signed)
done

## 2021-08-12 ENCOUNTER — Encounter: Payer: Self-pay | Admitting: Cardiology

## 2021-08-12 ENCOUNTER — Ambulatory Visit: Payer: Self-pay | Admitting: Cardiology

## 2021-08-12 ENCOUNTER — Other Ambulatory Visit: Payer: Self-pay

## 2021-08-12 VITALS — BP 119/60 | HR 86 | Temp 97.8°F | Resp 16 | Ht 71.0 in | Wt 188.0 lb

## 2021-08-12 DIAGNOSIS — Z950 Presence of cardiac pacemaker: Secondary | ICD-10-CM

## 2021-08-12 DIAGNOSIS — I2510 Atherosclerotic heart disease of native coronary artery without angina pectoris: Secondary | ICD-10-CM

## 2021-08-12 DIAGNOSIS — E782 Mixed hyperlipidemia: Secondary | ICD-10-CM | POA: Insufficient documentation

## 2021-08-12 DIAGNOSIS — I495 Sick sinus syndrome: Secondary | ICD-10-CM

## 2021-08-12 MED ORDER — REPATHA SURECLICK 140 MG/ML ~~LOC~~ SOAJ: 140.0000 mg | SUBCUTANEOUS | 5 refills | Status: DC

## 2021-08-12 NOTE — Progress Notes (Signed)
Patient referred by John Hose, MD for coronary artery disease  Subjective:   John Moyer, male    DOB: 11/09/1955, 65 y.o.   MRN: [id number]   Chief Complaint  Patient presents with   Coronary artery disease involving native coronary artery of   Follow-up    4 week     HPI  65 y.o. Caucasian male with coronary artery disease s/p CABG, type 2 diabetes mellitus, h/o stroke, former smoker  Reviewed external medical records. Discharge summary from 09/2019 at [facility] in [place].  Patient presented to the [facility] on December 14 after cardiac arrest with remarkable neurological improvement.  He reportedly had bradycardia/asystole, underwent CPR, temporary pacing and eventually a permanent pacemaker.  Further records, visit from angiogram was not deemed necessary given the patient's presentation was not with STEMI.  Of note, patient had previously undergone CABG x4 in 2019 and previously was known to have ischemic cardiomyopathy with EF of 20%.  He is not on ACE or ARB due to hyperkalemia.  Initial consultation HPI: Patient is originally from [place], moved to [place] in January 2022. Patient is here with his wife today.  Patient had extremely poor historian, so is difficult to piece history together.  It sounds like patient has had a stroke couple years ago.  In December 2020 or 2021 (there is disagreement between patient and his wife), patient underwent coronary bypass surgery.  Either before or after the surgery, wife tells me that patient "died for 6 minutes", EMS were called and he was hospitalized locally and then transferred to "[facility]" in [place], [place].  He reportedly had a kidney infection.  Again, details are very limited.  Currently, patient ambulates using a walker.  Activities limited to walking around the house and on the deck.  With this level of activity, denies any complaints of chest pain, shortness  of breath, residual significant weakness from previous stroke etc.  Patient is going to undergo colonoscopy.  He could not tell me whether this was a screening colonoscopy or because of any GI symptoms.  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 Swallow whole.     atorvastatin (LIPITOR) 80 MG tablet Take 80 mg by mouth daily.     carvedilol (COREG) 3.125 MG tablet Take 3.125 mg by mouth 2 (two) times daily with a meal.     ezetimibe (ZETIA) 10 MG tablet Take 1 tablet (10 mg total) by mouth daily. 90 tablet 3   glipiZIDE (GLUCOTROL) 5 MG tablet Take 5 mg by mouth daily before breakfast.     No current facility-administered medications on file prior to visit.    Cardiovascular and other pertinent studies:  Echocardiogram 07/22/2021:  Left ventricle cavity is normal in size and wall thickness. Abnormal  septal wall motion due to post-operative coronary artery bypass graft.  Normal LV systolic function with EF 56%. Indeterminate diastolic filling  pattern.  Moderate tricuspid regurgitation. Estimated pulmonary artery systolic  pressure 28 mmHg.  Scheduled  In office pacemaker check  07/08/2021: Single (S)/Dual (D)/BV: D. Presenting Sinus rhythm, V paced. A13%, V49% Pacemaker dependant:  No AMS Episodes None recent HVR None Longevity 8 Years. Magnet rate: >85%. Lead measurements: Stable. Thoracic impedance: NA. Histogram:  Low (L)/normal (N)/high (H)  N.   Observations: Indication not known. Placed elsewhere. No records available   EKG 07/08/2021: Sinus rhythm 74 bpm Ventricular paced rhythm   Recent labs: 06/11/2021: Glucose 113, BUN/Cr 48/1.79. eGFR 41. Na/K 142/5.0. Rest of the CMP normal H/H 12/39. MCV 90. Platelets 146 Chol 148, TG 135, HDL 39, LDL 86    Review of  Systems  Cardiovascular:  Negative for chest pain, dyspnea on exertion, leg swelling, palpitations and syncope.  Neurological:  Positive for loss of balance.        Vitals:   08/12/21 1107  BP: 119/60  Pulse: 86  Resp: 16  Temp: 97.8 F (36.6 C)  SpO2: 97%     Body mass index is 26.22 kg/m. Filed Weights   08/12/21 1107  Weight: 188 lb (85.3 kg)     Objective:   Physical Exam Vitals and nursing note reviewed.  Constitutional:      General: He is not in acute distress. Neck:     Vascular: No JVD.  Cardiovascular:     Rate and Rhythm: Normal rate and regular rhythm.     Pulses: Normal pulses.     Heart sounds: Normal heart sounds. No murmur heard. Pulmonary:     Effort: Pulmonary effort is normal.     Breath sounds: Normal breath sounds. No wheezing or rales.  Chest:     Comments: Scars of sternotomy and left upper pectoral device placement Musculoskeletal:     Right lower leg: Edema (Trace) present.     Left lower leg: Edema (Trace) present.         Assessment & Recommendations:   65 y.o. Caucasian male with coronary artery disease s/p CABG (2019), h/o asystole cardiac arrest and sinus node dysfunction (2020) requiring placement of dual chamber PPM, type 2 diabetes mellitus, CKD 3, h/o stroke, former smoker  CAD: S/p CABG,  Continue aspirin, statin, carvedilol. LDL 86 on atorvastatin 80 mg, ezetimibe 10 mg. Recommend addition of Repatha with gloal LDL <55  H//o ischemic cardiomyopathy: EF recovered to normal (Echocardiogram 07/2021)   Sinus node dysfunction: Presentation with asystole cardiac arrest in 2020, underwent dual chamber pacemaker placement Functioning well  Recommend f/u w/PCP re: CKD and difficult with balance. Consider workup for neuropathy  F/u in 3 months after repeat lipid panel   John Mormon, MD Pager: [phone] Office: [phone]

## 2021-08-18 ENCOUNTER — Other Ambulatory Visit: Payer: Self-pay | Admitting: Internal Medicine

## 2021-08-30 ENCOUNTER — Encounter: Payer: Self-pay | Admitting: Podiatry

## 2021-08-30 ENCOUNTER — Other Ambulatory Visit: Payer: Self-pay

## 2021-08-30 ENCOUNTER — Ambulatory Visit (INDEPENDENT_AMBULATORY_CARE_PROVIDER_SITE_OTHER): Payer: 59 | Admitting: Podiatry

## 2021-08-30 DIAGNOSIS — B351 Tinea unguium: Secondary | ICD-10-CM

## 2021-08-30 DIAGNOSIS — M79674 Pain in right toe(s): Secondary | ICD-10-CM | POA: Diagnosis not present

## 2021-08-30 DIAGNOSIS — E119 Type 2 diabetes mellitus without complications: Secondary | ICD-10-CM

## 2021-08-30 DIAGNOSIS — M79675 Pain in left toe(s): Secondary | ICD-10-CM | POA: Diagnosis not present

## 2021-08-30 DIAGNOSIS — L84 Corns and callosities: Secondary | ICD-10-CM | POA: Diagnosis not present

## 2021-08-30 DIAGNOSIS — L89520 Pressure ulcer of left ankle, unstageable: Secondary | ICD-10-CM | POA: Diagnosis not present

## 2021-08-30 NOTE — Patient Instructions (Signed)
Diabetes Mellitus and Foot Care Foot care is an important part of your health, especially when you have diabetes. Diabetes may cause you to have problems because of poor blood flow (circulation) to your feet and legs, which can cause your skin to: Become thinner and drier. Break more easily. Heal more slowly. Peel and crack. You may also have nerve damage (neuropathy) in your legs and feet, causing decreased feeling in them. This means that you may not notice minor injuries to your feet that could lead to more serious problems. Noticing and addressing any potential problems early is the best way to prevent future foot problems. How to care for your feet Foot hygiene  Wash your feet daily with warm water and mild soap. Do not use hot water. Then, pat your feet and the areas between your toes until they are completely dry. Do not soak your feet as this can dry your skin. Trim your toenails straight across. Do not dig under them or around the cuticle. File the edges of your nails with an emery board or nail file. Apply a moisturizing lotion or petroleum jelly to the skin on your feet and to dry, brittle toenails. Use lotion that does not contain alcohol and is unscented. Do not apply lotion between your toes. Shoes and socks Wear clean socks or stockings every day. Make sure they are not too tight. Do not wear knee-high stockings since they may decrease blood flow to your legs. Wear shoes that fit properly and have enough cushioning. Always look in your shoes before you put them on to be sure there are no objects inside. To break in new shoes, wear them for just a few hours a day. This prevents injuries on your feet. Wounds, scrapes, corns, and calluses  Check your feet daily for blisters, cuts, bruises, sores, and redness. If you cannot see the bottom of your feet, use a mirror or ask someone for help. Do not cut corns or calluses or try to remove them with medicine. If you find a minor scrape,  cut, or break in the skin on your feet, keep it and the skin around it clean and dry. You may clean these areas with mild soap and water. Do not clean the area with peroxide, alcohol, or iodine. If you have a wound, scrape, corn, or callus on your foot, look at it several times a day to make sure it is healing and not infected. Check for: Redness, swelling, or pain. Fluid or blood. Warmth. Pus or a bad smell. General tips Do not cross your legs. This may decrease blood flow to your feet. Do not use heating pads or hot water bottles on your feet. They may burn your skin. If you have lost feeling in your feet or legs, you may not know this is happening until it is too late. Protect your feet from hot and cold by wearing shoes, such as at the beach or on hot pavement. Schedule a complete foot exam at least once a year (annually) or more often if you have foot problems. Report any cuts, sores, or bruises to your health care provider immediately. Where to find more information American Diabetes Association: www.diabetes.org Association of Diabetes Care & Education Specialists: www.diabeteseducator.org Contact a health care provider if: You have a medical condition that increases your risk of infection and you have any cuts, sores, or bruises on your feet. You have an injury that is not healing. You have redness on your legs or feet. You   feel burning or tingling in your legs or feet. You have pain or cramps in your legs and feet. Your legs or feet are numb. Your feet always feel cold. You have pain around any toenails. Get help right away if: You have a wound, scrape, corn, or callus on your foot and: You have pain, swelling, or redness that gets worse. You have fluid or blood coming from the wound, scrape, corn, or callus. Your wound, scrape, corn, or callus feels warm to the touch. You have pus or a bad smell coming from the wound, scrape, corn, or callus. You have a fever. You have a red  line going up your leg. Summary Check your feet every day for blisters, cuts, bruises, sores, and redness. Apply a moisturizing lotion or petroleum jelly to the skin on your feet and to dry, brittle toenails. Wear shoes that fit properly and have enough cushioning. If you have foot problems, report any cuts, sores, or bruises to your health care provider immediately. Schedule a complete foot exam at least once a year (annually) or more often if you have foot problems. This information is not intended to replace advice given to you by your health care provider. Make sure you discuss any questions you have with your health care provider. Document Revised: 04/23/2020 Document Reviewed: 04/23/2020 Elsevier Patient Education  [place].  Pressure Injury A pressure injury is damage to the skin and underlying tissue that results from pressure being applied to an area of the body. It often affects people who must spend a long time in a bed or chair because of a medical condition. Pressure injuries usually occur: Over bony parts of the body, such as the tailbone, shoulders, elbows, hips, heels, spine, ankles, and back of the head. Under medical devices that make contact with the body, such as respiratory equipment, stockings, tubes, and splints. Pressure injuries start as reddened areas on the skin and can lead to pain and an open wound. What are the causes? This condition is caused by frequent or constant pressure to an area of the body. Decreased blood flow to the skin can eventually cause the skin tissue to die and break down, causing a wound. What increases the risk? You are more likely to develop this condition if you: Are in the hospital or an extended care facility. Are bedridden or in a wheelchair. Have an injury or disease that keeps you from: Moving normally. Feeling pain or pressure. Have a condition that: Makes you sleepy or less alert. Causes poor blood flow. Need to wear a  medical device. Have poor control of your bladder or bowel functions (incontinence). Have poor nutrition (malnutrition). If you are at risk for pressure injuries, your health care provider may recommend certain types of mattresses, mattress covers, pillows, cushions, or boots to help prevent them. These may include products filled with air, foam, gel, or sand. What are the signs or symptoms? Symptoms of this condition depend on the severity of the injury. Symptoms may include: Red or dark areas of the skin. Pain, warmth, or a change of skin texture. Blisters. An open wound. How is this diagnosed? This condition is diagnosed with a medical history and physical exam. You may also have tests, such as: Blood tests. Imaging tests. Blood flow tests. Your pressure injury will be staged based on its severity. Staging is based on: The depth of the tissue injury, including whether there is exposure of muscle, bone, or tendon. The cause of the pressure injury.  How is this treated? This condition may be treated by: Relieving or redistributing pressure on your skin. This includes: Frequently changing your position. Avoiding positions that caused the wound or that can make the wound worse. Using specific bed mattresses, chair cushions, or protective boots. Moving medical devices from an area of pressure, or placing padding between the skin and the device. Using foams, creams, or powders to prevent rubbing (friction) on the skin. Keeping your skin clean and dry. This may include using a skin cleanser or skin barrier as told by your health care provider. Cleaning your injury and removing any dead tissue from the wound (debridement). Placing a bandage (dressing) over your injury. Using medicines for pain or to prevent or treat infection. Surgery may be needed if other treatments are not working or if your injury is very deep. Follow these instructions at home: Wound care Follow instructions from your  health care provider about how to take care of your wound. Make sure you: Wash your hands with soap and water before and after you change your bandage (dressing). If soap and water are not available, use hand sanitizer. Change your dressing as told by your health care provider.  Check your wound every day for signs of infection. Have a caregiver do this for you if you are not able. Check for: Redness, swelling, or increased pain. More fluid or blood. Warmth. Pus or a bad smell. Skin care Keep your skin clean and dry. Gently pat your skin dry. Do not rub or massage your skin. You or a caregiver should check your skin every day for any changes in color or any new blisters or sores (ulcers). Medicines Take over-the-counter and prescription medicines only as told by your health care provider. If you were prescribed an antibiotic medicine, take or apply it as told by your health care provider. Do not stop using the antibiotic even if your condition improves. Reducing and redistributing pressure Do not lie or sit in one position for a long time. Move or change position every 1-2 hours, or as told by your health care provider. Use pillows or cushions to reduce pressure. Ask your health care provider to recommend cushions or pads for you. General instructions  Eat a healthy diet that includes lots of protein. Drink enough fluid to keep your urine pale yellow. Be as active as you can every day. Ask your health care provider to suggest safe exercises or activities. Do not abuse drugs or alcohol. Do not use any products that contain nicotine or tobacco, such as cigarettes, e-cigarettes, and chewing tobacco. If you need help quitting, ask your health care provider. Keep all follow-up visits as told by your health care provider. This is important. Contact a health care provider if: You have: A fever or chills. Pain that is not helped by medicine. Any changes in skin color. New blisters or  sores. Pus or a bad smell coming from your wound. Redness, swelling, or pain around your wound. More fluid or blood coming from your wound. Your wound does not improve after 1-2 weeks of treatment. Summary A pressure injury is damage to the skin and underlying tissue that results from pressure being applied to an area of the body. Do not lie or sit in one position for a long time. Your health care provider may advise you to move or change position every 1-2 hours. Follow instructions from your health care provider about how to take care of your wound. Keep all follow-up visits as told  by your health care provider. This is important. This information is not intended to replace advice given to you by your health care provider. Make sure you discuss any questions you have with your health care provider. Document Revised: 05/02/2018 Document Reviewed: 05/02/2018 Elsevier Patient Education  [place] are small areas of thickened skin that form on the top, sides, or tip of a toe. Corns have a cone-shaped core with a point that can press on a nerve below. This causes pain. Calluses are areas of thickened skin that can form anywhere on the body, including the hands, fingers, palms, soles of the feet, and heels. Calluses are usually larger than corns. What are the causes? Corns and calluses are caused by rubbing (friction) or pressure, such as from shoes that are too tight or do not fit properly. What increases the risk? Corns are more likely to develop in people who have misshapen toes (toe deformities), such as hammer toes. Calluses can form with friction to any area of the skin. They are more likely to develop in people who: Work with their hands. Wear shoes that fit poorly, are too tight, or are high-heeled. Have toe deformities. What are the signs or symptoms? Symptoms of a corn or callus include: A hard growth on the skin. Pain or tenderness under the  skin. Redness and swelling. Increased discomfort while wearing tight-fitting shoes, if your feet are affected. If a corn or callus becomes infected, symptoms may include: Redness and swelling that gets worse. Pain. Fluid, blood, or pus draining from the corn or callus. How is this diagnosed? Corns and calluses may be diagnosed based on your symptoms, your medical history, and a physical exam. How is this treated? Treatment for corns and calluses may include: Removing the cause of the friction or pressure. This may involve: Changing your shoes. Wearing shoe inserts (orthotics) or other protective layers in your shoes, such as a corn pad. Wearing gloves. Applying medicine to the skin (topical medicine) to help soften skin in the hardened, thickened areas. Removing layers of dead skin with a file to reduce the size of the corn or callus. Removing the corn or callus with a scalpel or laser. Taking antibiotic medicines, if your corn or callus is infected. Having surgery, if a toe deformity is the cause. Follow these instructions at home:  Take over-the-counter and prescription medicines only as told by your health care provider. If you were prescribed an antibiotic medicine, take it as told by your health care provider. Do not stop taking it even if your condition improves. Wear shoes that fit well. Avoid wearing high-heeled shoes and shoes that are too tight or too loose. Wear any padding, protective layers, gloves, or orthotics as told by your health care provider. Soak your hands or feet. Then use a file or pumice stone to soften your corn or callus. Do this as told by your health care provider. Check your corn or callus every day for signs of infection. Contact a health care provider if: Your symptoms do not improve with treatment. You have redness or swelling that gets worse. Your corn or callus becomes painful. You have fluid, blood, or pus coming from your corn or callus. You have  new symptoms. Get help right away if: You develop severe pain with redness. Summary Corns are small areas of thickened skin that form on the top, sides, or tip of a toe. These can be painful. Calluses are areas of thickened skin  that can form anywhere on the body, including the hands, fingers, palms, and soles of the feet. Calluses are usually larger than corns. Corns and calluses are caused by rubbing (friction) or pressure, such as from shoes that are too tight or do not fit properly. Treatment may include wearing padding, protective layers, gloves, or orthotics as told by your health care provider. This information is not intended to replace advice given to you by your health care provider. Make sure you discuss any questions you have with your health care provider. Document Revised: 01/30/2020 Document Reviewed: 01/30/2020 Elsevier Patient Education  2022 [employer].

## 2021-09-02 ENCOUNTER — Encounter: Payer: Self-pay | Admitting: Podiatry

## 2021-09-02 DIAGNOSIS — E119 Type 2 diabetes mellitus without complications: Secondary | ICD-10-CM | POA: Insufficient documentation

## 2021-09-02 NOTE — Progress Notes (Signed)
Subjective: John Moyer presents today referred by [name], MD for diabetic foot evaluation. He is accompanied by his wife on today's visit.  Patient relates 5 year history of diabetes.  He has h/o CVA with left sided weakness. Wife states he developed lesion on lateral aspect of left ankle. States skin was hard on outside of ankle, but it has improved.  Patient endorses symptoms of numbness in feet.  Patient relates blood glucose was 130 mg/dl this morning.   Today, patient c/o painful elongated mycotic toenails 1-5 bilaterally which are tender when wearing enclosed shoe gear. Pain is relieved with periodic professional debridement.Marland Kitchen  PCP is [name], MD , and last visit was October, 2022.  Past Medical History:  Diagnosis Date   CAD (coronary artery disease)    Pacemaker [employer] Accolade MRI Dual chamber pacemaker 07/24/2021   Stroke [facility])    Type 2 diabetes mellitus ([place])     Patient Active Problem List   Diagnosis Date Noted   Type 2 diabetes mellitus ([place]) 09/02/2021   Mixed hyperlipidemia 08/12/2021   Pacemaker [place] MRI Dual chamber pacemaker 07/24/2021   Sinus node dysfunction ([place]) 07/24/2021   Encounter for care of pacemaker 07/08/2021   Coronary artery disease involving native coronary artery of native heart without angina pectoris 07/07/2021    Past Surgical History:  Procedure Laterality Date   CORONARY ARTERY BYPASS GRAFT      Current Outpatient Medications on File Prior to Visit  Medication Sig Dispense Refill   aspirin EC 81 MG tablet Take 81 mg by mouth daily. Swallow whole.     atorvastatin (LIPITOR) 80 MG tablet Take 80 mg by mouth daily.     carvedilol (COREG) 3.125 MG tablet Take 3.125 mg by mouth 2 (two) times daily with a meal. (Patient not taking: Reported on 08/30/2021)     Evolocumab (REPATHA SURECLICK) 258 MG/ML SOAJ Inject 140 mg into the skin every 14 (fourteen) days. 6 mL 5   ezetimibe  (ZETIA) 10 MG tablet Take 1 tablet (10 mg total) by mouth daily. 90 tablet 3   glipiZIDE (GLUCOTROL) 5 MG tablet Take 5 mg by mouth daily before breakfast.     ONETOUCH VERIO test strip 1 each daily.     No current facility-administered medications on file prior to visit.     No Known Allergies  Social History   Occupational History   Not on file  Tobacco Use   Smoking status: Former    Packs/day: 1.00    Years: 50.00    Pack years: 50.00    Types: Cigarettes    Quit date: 2001    Years since quitting: 21.8   Smokeless tobacco: Never  Vaping Use   Vaping Use: Former   Quit date: 10/17/2018  Substance and Sexual Activity   Alcohol use: Not Currently   Drug use: Not Currently    Types: Marijuana   Sexual activity: Not on file    History reviewed. No pertinent family history.   There is no immunization history on file for this patient.  Objective: There were no vitals filed for this visit.  John Moyer is a pleasant 65 y.o. male WD, WN in NAD. AAO X 3.  Vascular Examination: CFT immediate b/l LE. Palpable DP/PT pulses b/l LE. Digital hair absent b/l. Skin temperature gradient WNL b/l. No pain with calf compression b/l. No edema noted b/l. No cyanosis or clubbing noted b/l LE.  Dermatological Examination: Pedal integument  with normal turgor, texture and tone b/l LE. No open wounds b/l. No interdigital macerations b/l. Toenails 1-5 b/l elongated, thickened, discolored with subungual debris. +Tenderness with dorsal palpation of nailplates. Hyperkeratotic lesion(s) noted submet head 5 left foot. Lateral malleolus with 0.5 cm annular lesion. Dry scab noted. No erythema, no edema, no drainage, no fluctuance.  Musculoskeletal Examination: Muscle strength 5/5 to all LE muscle groups of right lower extremity. Inversion deformity left ankle.  Footwear Assessment: Does the patient wear appropriate shoes? Yes. Does the patient need inserts/orthotics? Yes.  Neurological  Examination: Protective sensation intact 5/5 intact bilaterally with 10g monofilament b/l. Vibratory sensation intact b/l.  Assessment: 1. Pain due to onychomycosis of toenails of both feet   2. Callus   3. Decubitus ulcer of ankle, left, unstageable ([place])   4. Type 2 diabetes mellitus without complication, without long-term current use of insulin ([place])   5. Encounter for diabetic foot exam ([place])    ADA Risk Categorization: Low Risk:  Patient has all of the following: Intact protective sensation No prior foot ulcer  No severe deformity Pedal pulses present  Plan: -Examined patient. -For unstageable decubitus ulcer left lateral malleolus, recommended heel protect for him to wear when he is in bed. He is not to weightbear with heel protector. -Diabetic foot examination performed today. -Discussed diabetic foot care principles. Literature dispensed on today. -Continue diabetic foot care principles: inspect feet daily, monitor glucose as recommended by PCP and/or Endocrinologist, and follow prescribed diet per PCP, Endocrinologist and/or dietician. -Toenails 1-5 b/l were debrided in length and girth with sterile nail nippers and dremel without iatrogenic bleeding.  -Callus(es) submet head 5 left foot pared utilizing sterile scalpel blade without complication or incident. Total number debrided =1. -Patient/POA to call should there be question/concern in the interim.  Return in about 3 months (around 11/30/2021).  Marzetta Board, DPM

## 2021-09-28 ENCOUNTER — Encounter: Payer: Self-pay | Admitting: Physical Therapy

## 2021-09-28 ENCOUNTER — Other Ambulatory Visit: Payer: Self-pay

## 2021-09-28 ENCOUNTER — Ambulatory Visit: Payer: 59 | Attending: Internal Medicine | Admitting: Physical Therapy

## 2021-09-28 DIAGNOSIS — R2689 Other abnormalities of gait and mobility: Secondary | ICD-10-CM | POA: Insufficient documentation

## 2021-09-28 DIAGNOSIS — R2681 Unsteadiness on feet: Secondary | ICD-10-CM | POA: Insufficient documentation

## 2021-09-28 DIAGNOSIS — Z9181 History of falling: Secondary | ICD-10-CM | POA: Diagnosis present

## 2021-09-28 DIAGNOSIS — M6281 Muscle weakness (generalized): Secondary | ICD-10-CM | POA: Insufficient documentation

## 2021-09-28 NOTE — Therapy (Signed)
[place]. [place], [place], [address] Phone: [phone]   Fax:  [phone]  Physical Therapy Evaluation  Patient Details  Name: John Moyer MRN: [id number] Date of Birth: 16-Jul-1956 Referring Provider (PT): Dr. Maia Petties   Encounter Date: 09/28/2021   PT End of Session - 09/28/21 1631     Visit Number 1    Number of Visits 17    Date for PT Re-Evaluation 11/23/21    Authorization Type [employer]    Authorization Time Period 09/28/21 to 12/13/21    PT Start Time 1532    PT Stop Time 1614    PT Time Calculation (min) 42 min    Equipment Utilized During Treatment Gait belt    Activity Tolerance Patient tolerated treatment well    Behavior During Therapy [facility] for tasks assessed/performed             Past Medical History:  Diagnosis Date   CAD (coronary artery disease)    Pacemaker [employer] Accolade MRI Dual chamber pacemaker 07/24/2021   Stroke ([place])    Type 2 diabetes mellitus ([place])     Past Surgical History:  Procedure Laterality Date   CORONARY ARTERY BYPASS GRAFT      There were no vitals filed for this visit.    Subjective Assessment - 09/28/21 1534     Subjective I had a stroke a few years ago; I really have trouble holding my balance. I also had an episode where I died for 6 minutes and needed CPR to come back. My balance got worse after that too. I'm not sure what causes the most trouble with my balance, I'm just unsteady. Per wife, he can fall any time. I did have OP PT in [place] before.    Patient Stated Goals not fall anymore    Currently in Pain? No/denies                [facility] PT Assessment - 09/28/21 0001       Assessment   Medical Diagnosis falls    Referring Provider (PT) Dr. Maia Petties    Onset Date/Surgical Date --   chronic   Next MD Visit [facility] about once a month- has frequent check ins    Prior Therapy OP PT in [place] for balance      Precautions   Precautions  Fall      Restrictions   Weight Bearing Restrictions No      Balance Screen   Has the patient fallen in the past 6 months No    Has the patient had a decrease in activity level because of a fear of falling?  Yes    Is the patient reluctant to leave their home because of a fear of falling?  No      Home Ecologist residence      Prior Function   Level of Independence Independent;Independent with basic ADLs    Vocation Retired    Leisure watching TV, very sedentary      ROM / Strength   AROM / PROM / Strength Strength      Strength   Strength Assessment Site Hip;Knee    Right/Left Hip Right;Left    Right Hip Flexion 3/5    Right Hip ABduction 4+/5    Left Hip Flexion 3/5    Left Hip ABduction 4+/5    Right/Left Knee Right;Left    Right Knee Flexion 4-/5  Right Knee Extension 5/5    Left Knee Flexion 4-/5    Left Knee Extension 5/5      6 minute walk test results    Aerobic Endurance Distance Walked 500    Endurance additional comments tends to lean left, unsteadiness (multidirectional) and L foot drop increase along with fatigue   needed up to [place] to prevent a fall when turning     Standardized Balance Assessment   Standardized Balance Assessment Berg Balance Test      Berg Balance Test   Sit to Stand Able to stand  independently using hands    Standing Unsupported Able to stand 2 minutes with supervision    Sitting with Back Unsupported but Feet Supported on Floor or Stool Able to sit safely and securely 2 minutes    Stand to Sit Controls descent by using hands    Transfers Able to transfer safely, definite need of hands    Standing Unsupported with Eyes Closed Able to stand 10 seconds with supervision    Standing Unsupported with Feet Together Able to place feet together independently and stand for 1 minute with supervision    From Standing, Reach Forward with Outstretched Arm Can reach forward >12 cm safely (5")    From Standing  Position, Pick up Object from Floor Able to pick up shoe, needs supervision    From Standing Position, Turn to Look Behind Over each Shoulder Needs supervision when turning    Turn 360 Degrees Needs close supervision or verbal cueing    Standing Unsupported, Alternately Place Feet on Step/Stool Able to stand independently and complete 8 steps >20 seconds    Standing Unsupported, One Foot in [employer] balance while stepping or standing    Standing on One Leg Tries to lift leg/unable to hold 3 seconds but remains standing independently    Total Score 34                        Objective measurements completed on examination: See above findings.       [facility] Adult PT Treatment/Exercise - 09/28/21 0001       Exercises   Exercises Knee/Hip      Knee/Hip Exercises: Seated   Other Seated Knee/Hip Exercises seated ankle DF with red TB 1x10 eccentric lowers      Knee/Hip Exercises: Supine   Bridges Both;1 set;10 reps                 Balance Exercises - 09/28/21 0001       Balance Exercises: Standing   Other Standing Exercises narrow BOS x1 during Berg                PT Education - 09/28/21 1630     Education Details exam findings, POC, HEP    Person(s) Educated Patient;Spouse    Methods Explanation    Comprehension Verbalized understanding              PT Short Term Goals - 09/28/21 1636       PT SHORT TERM GOAL #1   Title Will be compliant with appropriate progressive HEP    Time 4    Period Weeks    Status New    Target Date 10/26/21      PT SHORT TERM GOAL #2   Title Will be able to verbalize at least 3 strategies to prevent falls at home and in community    Time 4  Period Weeks    Status New      PT SHORT TERM GOAL #3   Title Will be able to tolerate at least 15 minutes of physical activity without rest, RPE less than 3/10 in order to show improving functional activity tolerance    Time 4    Period Weeks    Status New       PT SHORT TERM GOAL #4   Title Will demonstrate improved L ankle DF/foot clearance with gait with no toe catching or foot slap in order to show improving gait mechanics    Time 4    Period Weeks    Status New               PT Long Term Goals - 09/28/21 1639       PT LONG TERM GOAL #1   Title MMT to have improved by at least one grade in all weak groups to show improvement in functional strength    Time 8    Period Weeks    Status New    Target Date 11/23/21      PT LONG TERM GOAL #2   Title Will score at least 45 on Berg to show improved functional balance skills and reduced fall risk    Time 8    Period Weeks    Status New      PT LONG TERM GOAL #3   Title Will be able tolerate at least 30 minutes of activity without increased fatigue or RPE more than 3/10 in order to show improved functional activity tolerance/improve community access    Time 8    Period Weeks    Status New      PT LONG TERM GOAL #4   Title Will be compliant with long term gym based program (such as silver sneakers) in order to maintain long term functional gains from PT    Time 8    Period Weeks    Status New                    Plan - 09/28/21 1632     Clinical Impression Statement John Moyer arrives today doing well; reports he did in the past have a CVA with L residual weakness, and also had an episode of PEA requiring CPR for 6 minutes before ROC. His balance got worse after each of these events but he and his wife report he has not had too many issues in the past year. Exam reveals functional muscle weakness, poor score on the Berg indicating almost 100% fall risk, and gait mechanic impairments which worsen drastically with fatigue. Needed one time ModA to prevent a fall when going around a corner. Might also benefit from low grade AFO if we cannot improve L anterior tib mm strength and endurance as well. Will benefit from skilled PT services to address functional impairments and reduce fall  risk moving forward.    Personal Factors and Comorbidities Age;Fitness;Past/Current Experience;Comorbidity 2;Social Background;Education;Time since onset of injury/illness/exacerbation    Comorbidities hx CVA, LOC with PEA requiring CPR x6 minutes    Examination-Activity Limitations Locomotion Level;Transfers;Bed Mobility;Carry;Squat;Stairs;Stand;Lift    Examination-Participation Restrictions Church;Cleaning;Community Activity;Yard Work;Interpersonal Relationship    Stability/Clinical Decision Making Evolving/Moderate complexity    Clinical Decision Making Moderate    Rehab Potential Good    PT Frequency 2x / week    PT Duration 8 weeks    PT Treatment/Interventions ADLs/Self Care Home Management;Cryotherapy;Electrical Stimulation;Iontophoresis 4mg /ml Dexamethasone;Moist Heat;Traction;Ultrasound;DME Instruction;Gait training;Stair training;Functional mobility  training;Therapeutic activities;Therapeutic exercise;Balance training;Neuromuscular re-education;Patient/family education;Manual techniques;Passive range of motion;Dry needling;Energy conservation;Taping    PT Next Visit Plan review HEP; focus on functional strength and balance. Consider replicating AFO for gait training?    PT Home Exercise Plan [id number]    Consulted and Agree with Plan of Care Patient             Patient will benefit from skilled therapeutic intervention in order to improve the following deficits and impairments:  Abnormal gait, Decreased coordination, Difficulty walking, Decreased endurance, Decreased safety awareness, Decreased activity tolerance, Decreased balance, Decreased knowledge of use of DME, Decreased mobility, Decreased strength, Postural dysfunction  Visit Diagnosis: Unsteadiness on feet  Other abnormalities of gait and mobility  Muscle weakness (generalized)  History of falling     Problem List Patient Active Problem List   Diagnosis Date Noted   Type 2 diabetes mellitus ([place]) 09/02/2021    Mixed hyperlipidemia 08/12/2021   Pacemaker [place] MRI Dual chamber pacemaker 07/24/2021   Sinus node dysfunction ([place]) 07/24/2021   Encounter for care of pacemaker 07/08/2021   Coronary artery disease involving native coronary artery of native heart without angina pectoris 07/07/2021   Ann Lions PT, DPT, PN2   Supplemental Physical Therapist [place]    Pager [phone] Acute Rehab Office [place]. [place], [place], [address] Phone: [phone]   Fax:  [phone]  Name: John Moyer MRN: [id number] Date of Birth: 1956-03-30

## 2021-09-29 ENCOUNTER — Other Ambulatory Visit: Payer: Self-pay | Admitting: Gastroenterology

## 2021-10-06 ENCOUNTER — Other Ambulatory Visit: Payer: Self-pay

## 2021-10-06 ENCOUNTER — Encounter: Payer: Self-pay | Admitting: Physical Therapy

## 2021-10-06 ENCOUNTER — Ambulatory Visit: Payer: 59 | Admitting: Physical Therapy

## 2021-10-06 DIAGNOSIS — R2689 Other abnormalities of gait and mobility: Secondary | ICD-10-CM

## 2021-10-06 DIAGNOSIS — M6281 Muscle weakness (generalized): Secondary | ICD-10-CM

## 2021-10-06 DIAGNOSIS — R2681 Unsteadiness on feet: Secondary | ICD-10-CM

## 2021-10-06 DIAGNOSIS — Z9181 History of falling: Secondary | ICD-10-CM

## 2021-10-06 NOTE — Therapy (Signed)
[place]. [place], [place], [address] Phone: [phone]   Fax:  [phone]  Physical Therapy Treatment  Patient Details  Name: John Moyer MRN: [id number] Date of Birth: 14-Oct-1956 Referring Provider (PT): Dr. Maia Petties   Encounter Date: 10/06/2021   PT End of Session - 10/06/21 1512     Visit Number 2    Date for PT Re-Evaluation 11/23/21    Authorization Type [employer]    Authorization Time Period 09/28/21 to 12/13/21    PT Start Time 1430    PT Stop Time 1515    PT Time Calculation (min) 45 min    Activity Tolerance Patient tolerated treatment well    Behavior During Therapy [facility] for tasks assessed/performed             Past Medical History:  Diagnosis Date   CAD (coronary artery disease)    Pacemaker [employer] Accolade MRI Dual chamber pacemaker 07/24/2021   Stroke ([place])    Type 2 diabetes mellitus ([place])     Past Surgical History:  Procedure Laterality Date   CORONARY ARTERY BYPASS GRAFT      There were no vitals filed for this visit.   Subjective Assessment - 10/06/21 1430     Subjective "Trying to get back to waling without rollator" Feel fine, no falls.    Currently in Pain? No/denies                               [facility] Adult PT Treatment/Exercise - 10/06/21 0001       Ambulation/Gait   Stairs Yes    Stairs Assistance 5: Supervision;4: Min guard    Number of Stairs 15    Height of Stairs 4    Gait Comments Eccentric load weakness.      Knee/Hip Exercises: Stretches   Passive Hamstring Stretch Both;3 reps;10 seconds      Knee/Hip Exercises: Aerobic   Nustep L5 x 6 min      Knee/Hip Exercises: Standing   Other Standing Knee Exercises standing March w/ rolator 2x10      Knee/Hip Exercises: Seated   Long Arc Quad Strengthening;Both;2 sets;10 reps;Weights    Marching Strengthening;Both;2 sets;10 reps    Hamstring Curl Strengthening;1 set;2  sets;10 reps    Hamstring Limitations red    Sit to [employer] 2 sets;5 reps;with UE support      Knee/Hip Exercises: Supine   Bridges Strengthening;Both;2 sets;10 reps                       PT Short Term Goals - 09/28/21 1636       PT SHORT TERM GOAL #1   Title Will be compliant with appropriate progressive HEP    Time 4    Period Weeks    Status New    Target Date 10/26/21      PT SHORT TERM GOAL #2   Title Will be able to verbalize at least 3 strategies to prevent falls at home and in community    Time 4    Period Weeks    Status New      PT SHORT TERM GOAL #3   Title Will be able to tolerate at least 15 minutes of physical activity without rest, RPE less than 3/10 in order to show improving functional activity tolerance    Time 4    Period Weeks  Status New      PT SHORT TERM GOAL #4   Title Will demonstrate improved L ankle DF/foot clearance with gait with no toe catching or foot slap in order to show improving gait mechanics    Time 4    Period Weeks    Status New               PT Long Term Goals - 09/28/21 1639       PT LONG TERM GOAL #1   Title MMT to have improved by at least one grade in all weak groups to show improvement in functional strength    Time 8    Period Weeks    Status New    Target Date 11/23/21      PT LONG TERM GOAL #2   Title Will score at least 45 on Berg to show improved functional balance skills and reduced fall risk    Time 8    Period Weeks    Status New      PT LONG TERM GOAL #3   Title Will be able tolerate at least 30 minutes of activity without increased fatigue or RPE more than 3/10 in order to show improved functional activity tolerance/improve community access    Time 8    Period Weeks    Status New      PT LONG TERM GOAL #4   Title Will be compliant with long term gym based program (such as silver sneakers) in order to maintain long term functional gains from PT    Time 8    Period Weeks    Status  New                   Plan - 10/06/21 1513     Clinical Impression Statement Pt did well overall. Bilateral LE weakness present throughout session. Pt with some instability standing with sit to stands. Visual LE shaking when descending stairs. No reports of increase pain. Bilat HS tightness noted with passive stretching.    Personal Factors and Comorbidities Age;Fitness;Past/Current Experience;Comorbidity 2;Social Background;Education;Time since onset of injury/illness/exacerbation    Examination-Activity Limitations Locomotion Level;Transfers;Bed Mobility;Carry;Squat;Stairs;Stand;Lift    Examination-Participation Restrictions Church;Cleaning;Community Activity;Yard Work;Interpersonal Relationship    Stability/Clinical Decision Making Evolving/Moderate complexity    Rehab Potential Good    PT Frequency 2x / week    PT Duration 8 weeks    PT Treatment/Interventions ADLs/Self Care Home Management;Cryotherapy;Electrical Stimulation;Iontophoresis 4mg /ml Dexamethasone;Moist Heat;Traction;Ultrasound;DME Instruction;Gait training;Stair training;Functional mobility training;Therapeutic activities;Therapeutic exercise;Balance training;Neuromuscular re-education;Patient/family education;Manual techniques;Passive range of motion;Dry needling;Energy conservation;Taping    PT Next Visit Plan review HEP; focus on functional strength and balance. Consider replicating AFO for gait training?             Patient will benefit from skilled therapeutic intervention in order to improve the following deficits and impairments:  Abnormal gait, Decreased coordination, Difficulty walking, Decreased endurance, Decreased safety awareness, Decreased activity tolerance, Decreased balance, Decreased knowledge of use of DME, Decreased mobility, Decreased strength, Postural dysfunction  Visit Diagnosis: Unsteadiness on feet  Muscle weakness (generalized)  History of falling  Other abnormalities of gait and  mobility     Problem List Patient Active Problem List   Diagnosis Date Noted   Type 2 diabetes mellitus ([place]) 09/02/2021   Mixed hyperlipidemia 08/12/2021   Pacemaker [place] MRI Dual chamber pacemaker 07/24/2021   Sinus node dysfunction ([place]) 07/24/2021   Encounter for care of pacemaker 07/08/2021   Coronary artery disease involving native coronary artery  of native heart without angina pectoris 07/07/2021    Scot Jun, PTA 10/06/2021, 3:17 PM  [place]. [place], [place], [address] Phone: [phone]   Fax:  [phone]  Name: KEVAUGHN EWING MRN: [id number] Date of Birth: Nov 26, 1955

## 2021-10-12 ENCOUNTER — Other Ambulatory Visit: Payer: Self-pay

## 2021-10-12 ENCOUNTER — Encounter: Payer: Self-pay | Admitting: Physical Therapy

## 2021-10-12 ENCOUNTER — Ambulatory Visit: Payer: 59 | Admitting: Physical Therapy

## 2021-10-12 DIAGNOSIS — R2681 Unsteadiness on feet: Secondary | ICD-10-CM

## 2021-10-12 DIAGNOSIS — M6281 Muscle weakness (generalized): Secondary | ICD-10-CM

## 2021-10-12 DIAGNOSIS — R2689 Other abnormalities of gait and mobility: Secondary | ICD-10-CM

## 2021-10-12 DIAGNOSIS — Z9181 History of falling: Secondary | ICD-10-CM

## 2021-10-12 NOTE — Therapy (Signed)
[place] [employer] [place], [place], [address] Phone: [phone]   Fax:  [phone]  Physical Therapy Treatment  Patient Details  Name: John Moyer MRN: [id number] Date of Birth: [birth year] Referring Provider (PT): Dr. Maia Petties   Encounter Date: 10/12/2021   PT End of Session - 10/12/21 1617     Visit Number 3    Number of Visits 17    Date for PT Re-Evaluation 11/23/21    Authorization Type [employer]    Authorization Time Period 09/28/21 to 12/13/21    PT Start Time 1532    PT Stop Time 1611    PT Time Calculation (min) 39 min    Equipment Utilized During Treatment Gait belt    Activity Tolerance Patient tolerated treatment well    Behavior During Therapy [facility] for tasks assessed/performed             Past Medical History:  Diagnosis Date   CAD (coronary artery disease)    Pacemaker [employer] Accolade MRI Dual chamber pacemaker 07/24/2021   Stroke ([place])    Type 2 diabetes mellitus ([place])     Past Surgical History:  Procedure Laterality Date   CORONARY ARTERY BYPASS GRAFT      There were no vitals filed for this visit.   Subjective Assessment - 10/12/21 1535     Subjective No falls or close calls since last session. I was tired after last session, not used to exercising.    Patient Stated Goals not fall anymore    Currently in Pain? No/denies                               [facility] Adult PT Treatment/Exercise - 10/12/21 0001       Knee/Hip Exercises: Standing   Gait Training 590ft with mock afo only needed min guard for balance/safety                 Balance Exercises - 10/12/21 0001       Balance Exercises: Standing   Tandem Stance Eyes open;3 reps;20 secs    Tandem Gait Forward;4 reps   in // bars   Sidestepping 3 reps   in // bars   Heel Raises Both;20 reps    Other Standing Exercises toe taps on 4 inch box 1x10 B LEs    Other Standing Exercises  Comments standing marches 1x20 solid surface, backwards walking 2 laps in // bars                PT Education - 10/12/21 1616     Education Details benefits of getting formal custom AFO, process of such    Person(s) Educated Patient;Spouse    Methods Explanation    Comprehension Verbalized understanding              PT Short Term Goals - 09/28/21 1636       PT SHORT TERM GOAL #1   Title Will be compliant with appropriate progressive HEP    Time 4    Period Weeks    Status New    Target Date 10/26/21      PT SHORT TERM GOAL #2   Title Will be able to verbalize at least 3 strategies to prevent falls at home and in community    Time 4    Period Weeks    Status New      PT SHORT TERM  GOAL #3   Title Will be able to tolerate at least 15 minutes of physical activity without rest, RPE less than 3/10 in order to show improving functional activity tolerance    Time 4    Period Weeks    Status New      PT SHORT TERM GOAL #4   Title Will demonstrate improved L ankle DF/foot clearance with gait with no toe catching or foot slap in order to show improving gait mechanics    Time 4    Period Weeks    Status New               PT Long Term Goals - 09/28/21 1639       PT LONG TERM GOAL #1   Title MMT to have improved by at least one grade in all weak groups to show improvement in functional strength    Time 8    Period Weeks    Status New    Target Date 11/23/21      PT LONG TERM GOAL #2   Title Will score at least 45 on Berg to show improved functional balance skills and reduced fall risk    Time 8    Period Weeks    Status New      PT LONG TERM GOAL #3   Title Will be able tolerate at least 30 minutes of activity without increased fatigue or RPE more than 3/10 in order to show improved functional activity tolerance/improve community access    Time 8    Period Weeks    Status New      PT LONG TERM GOAL #4   Title Will be compliant with long term gym  based program (such as silver sneakers) in order to maintain long term functional gains from PT    Time 8    Period Weeks    Status New                   Plan - 10/12/21 1617     Clinical Impression Statement [place] arrives today doing well- we did rig up a mock AFO with therband, noted significant improvement in functional activity tolerance, L toe clearance, and balance when walking with this. Messaged referring MD requesting formal referral to [employer] for custom AFO, I think this will benefit him very much. Otherwise focused on functional balance training today. Did need some rest breaks in between tasks due to back pain. Will continue to progress as able.    Personal Factors and Comorbidities Age;Fitness;Past/Current Experience;Comorbidity 2;Social Background;Education;Time since onset of injury/illness/exacerbation    Comorbidities hx CVA, LOC with PEA requiring CPR x6 minutes    Examination-Activity Limitations Locomotion Level;Transfers;Bed Mobility;Carry;Squat;Stairs;Stand;Lift    Examination-Participation Restrictions Church;Cleaning;Community Activity;Yard Work;Interpersonal Relationship    Stability/Clinical Decision Making Evolving/Moderate complexity    Clinical Decision Making Moderate    Rehab Potential Good    PT Frequency 2x / week    PT Duration 8 weeks    PT Treatment/Interventions ADLs/Self Care Home Management;Cryotherapy;Electrical Stimulation;Iontophoresis 4mg /ml Dexamethasone;Moist Heat;Traction;Ultrasound;DME Instruction;Gait training;Stair training;Functional mobility training;Therapeutic activities;Therapeutic exercise;Balance training;Neuromuscular re-education;Patient/family education;Manual techniques;Passive range of motion;Dry needling;Energy conservation;Taping    PT Next Visit Plan f/u on process of getting AFO. Focus on strength/balance. Update HEP    PT Home Exercise Plan [id number]    Consulted and Agree with Plan of Care Patient              Patient will benefit from skilled therapeutic intervention in order to improve the  following deficits and impairments:  Abnormal gait, Decreased coordination, Difficulty walking, Decreased endurance, Decreased safety awareness, Decreased activity tolerance, Decreased balance, Decreased knowledge of use of DME, Decreased mobility, Decreased strength, Postural dysfunction  Visit Diagnosis: Unsteadiness on feet  Muscle weakness (generalized)  History of falling  Other abnormalities of gait and mobility     Problem List Patient Active Problem List   Diagnosis Date Noted   Type 2 diabetes mellitus ([place]) 09/02/2021   Mixed hyperlipidemia 08/12/2021   Pacemaker [place] MRI Dual chamber pacemaker 07/24/2021   Sinus node dysfunction ([place]) 07/24/2021   Encounter for care of pacemaker 07/08/2021   Coronary artery disease involving native coronary artery of native heart without angina pectoris 07/07/2021   Ann Lions PT, DPT, PN2   Supplemental Physical Therapist [facility]    Pager [phone] Acute Rehab Office [place]. [place], [place], [address] Phone: [phone]   Fax:  [phone]  Name: COURT GRACIA MRN: [id number] Date of Birth: Feb 14, 1956

## 2021-10-14 ENCOUNTER — Other Ambulatory Visit: Payer: Self-pay

## 2021-10-14 ENCOUNTER — Ambulatory Visit: Payer: 59 | Admitting: Physical Therapy

## 2021-10-14 ENCOUNTER — Encounter: Payer: Self-pay | Admitting: Physical Therapy

## 2021-10-14 DIAGNOSIS — R2681 Unsteadiness on feet: Secondary | ICD-10-CM

## 2021-10-14 DIAGNOSIS — Z9181 History of falling: Secondary | ICD-10-CM

## 2021-10-14 DIAGNOSIS — M6281 Muscle weakness (generalized): Secondary | ICD-10-CM

## 2021-10-14 NOTE — Therapy (Signed)
[place]. [place], [place], [address] Phone: [phone]   Fax:  [phone]  Physical Therapy Treatment  Patient Details  Name: John Moyer MRN: [id number] Date of Birth: 09-Jul-1956 Referring Provider (PT): Dr. Maia Petties   Encounter Date: 10/14/2021   PT End of Session - 10/14/21 1426     Visit Number 4    Date for PT Re-Evaluation 11/23/21    Authorization Type [employer]    Authorization Time Period 09/28/21 to 12/13/21    PT Start Time 1345    PT Stop Time 1426    PT Time Calculation (min) 41 min    Activity Tolerance Patient tolerated treatment well    Behavior During Therapy [facility] for tasks assessed/performed             Past Medical History:  Diagnosis Date   CAD (coronary artery disease)    Pacemaker [employer] Accolade MRI Dual chamber pacemaker 07/24/2021   Stroke ([place])    Type 2 diabetes mellitus ([place])     Past Surgical History:  Procedure Laterality Date   CORONARY ARTERY BYPASS GRAFT      There were no vitals filed for this visit.   Subjective Assessment - 10/14/21 1344     Subjective doing good, no falls or close calls    Currently in Pain? No/denies                               [facility] Adult PT Treatment/Exercise - 10/14/21 0001       Ambulation/Gait   Ambulation/Gait Yes    Ambulation Distance (Feet) 24 Feet    Assistive device Small based quad cane    Gait Pattern Step-to pattern    Ambulation Surface Level;Indoor      High Level Balance   High Level Balance Activities Side stepping    High Level Balance Comments standing eyes closed 2x10; Standing reaching outside BOS      Knee/Hip Exercises: Aerobic   Nustep L5 x 6 min      Knee/Hip Exercises: Machines for Strengthening   Cybex Knee Extension 5lb 2x10    Cybex Knee Flexion 25lb 2x10      Knee/Hip Exercises: Standing   Other Standing Knee Exercises Alt 4in taps SBQC 2x10       Knee/Hip Exercises: Seated   Sit to Sand 3 sets;5 reps;without UE support   min to mod assist at times                      PT Short Term Goals - 09/28/21 1636       PT SHORT TERM GOAL #1   Title Will be compliant with appropriate progressive HEP    Time 4    Period Weeks    Status New    Target Date 10/26/21      PT SHORT TERM GOAL #2   Title Will be able to verbalize at least 3 strategies to prevent falls at home and in community    Time 4    Period Weeks    Status New      PT SHORT TERM GOAL #3   Title Will be able to tolerate at least 15 minutes of physical activity without rest, RPE less than 3/10 in order to show improving functional activity tolerance    Time 4    Period Weeks    Status New  PT SHORT TERM GOAL #4   Title Will demonstrate improved L ankle DF/foot clearance with gait with no toe catching or foot slap in order to show improving gait mechanics    Time 4    Period Weeks    Status New               PT Long Term Goals - 09/28/21 1639       PT LONG TERM GOAL #1   Title MMT to have improved by at least one grade in all weak groups to show improvement in functional strength    Time 8    Period Weeks    Status New    Target Date 11/23/21      PT LONG TERM GOAL #2   Title Will score at least 45 on Berg to show improved functional balance skills and reduced fall risk    Time 8    Period Weeks    Status New      PT LONG TERM GOAL #3   Title Will be able tolerate at least 30 minutes of activity without increased fatigue or RPE more than 3/10 in order to show improved functional activity tolerance/improve community access    Time 8    Period Weeks    Status New      PT LONG TERM GOAL #4   Title Will be compliant with long term gym based program (such as silver sneakers) in order to maintain long term functional gains from PT    Time 8    Period Weeks    Status New                   Plan - 10/14/21 1426      Clinical Impression Statement increase time spent on sit to stands. Pt would stand by allowing LE to push against mat table, time spent with anterior weight shift and glut activation to get hips forward. During the eccentric phase pt would lean against mat before allowing knees to bend. Constant cue needed not to allow LE to lean against mat with side steps. Some instability standing reaching outside base of support.    Personal Factors and Comorbidities Age;Fitness;Past/Current Experience;Comorbidity 2;Social Background;Education;Time since onset of injury/illness/exacerbation    Comorbidities hx CVA, LOC with PEA requiring CPR x6 minutes    Examination-Activity Limitations Locomotion Level;Transfers;Bed Mobility;Carry;Squat;Stairs;Stand;Lift    Examination-Participation Restrictions Church;Cleaning;Community Activity;Yard Work;Interpersonal Relationship    Stability/Clinical Decision Making Evolving/Moderate complexity    PT Frequency 2x / week    PT Duration 8 weeks    PT Treatment/Interventions ADLs/Self Care Home Management;Cryotherapy;Electrical Stimulation;Iontophoresis 4mg /ml Dexamethasone;Moist Heat;Traction;Ultrasound;DME Instruction;Gait training;Stair training;Functional mobility training;Therapeutic activities;Therapeutic exercise;Balance training;Neuromuscular re-education;Patient/family education;Manual techniques;Passive range of motion;Dry needling;Energy conservation;Taping    PT Next Visit Plan f/u on process of getting AFO. Focus on strength/balance. Update HEP             Patient will benefit from skilled therapeutic intervention in order to improve the following deficits and impairments:  Abnormal gait, Decreased coordination, Difficulty walking, Decreased endurance, Decreased safety awareness, Decreased activity tolerance, Decreased balance, Decreased knowledge of use of DME, Decreased mobility, Decreased strength, Postural dysfunction  Visit Diagnosis: Unsteadiness on  feet  History of falling  Muscle weakness (generalized)     Problem List Patient Active Problem List   Diagnosis Date Noted   Type 2 diabetes mellitus ([place]) 09/02/2021   Mixed hyperlipidemia 08/12/2021   Pacemaker [employer] Accolade MRI Dual chamber pacemaker 07/24/2021   Sinus node  dysfunction ([place]) 07/24/2021   Encounter for care of pacemaker 07/08/2021   Coronary artery disease involving native coronary artery of native heart without angina pectoris 07/07/2021    Scot Jun, PTA 10/14/2021, 2:31 PM  [place]. [place], [place], [address] Phone: [phone]   Fax:  [phone]  Name: JOON POHLE MRN: [id number] Date of Birth: August 18, 1956

## 2021-10-19 ENCOUNTER — Encounter: Payer: Self-pay | Admitting: Physical Therapy

## 2021-10-19 ENCOUNTER — Other Ambulatory Visit: Payer: Self-pay

## 2021-10-19 ENCOUNTER — Ambulatory Visit: Payer: 59 | Attending: Internal Medicine | Admitting: Physical Therapy

## 2021-10-19 DIAGNOSIS — Z9181 History of falling: Secondary | ICD-10-CM

## 2021-10-19 DIAGNOSIS — M6281 Muscle weakness (generalized): Secondary | ICD-10-CM | POA: Diagnosis present

## 2021-10-19 DIAGNOSIS — R2681 Unsteadiness on feet: Secondary | ICD-10-CM | POA: Diagnosis present

## 2021-10-19 DIAGNOSIS — R2689 Other abnormalities of gait and mobility: Secondary | ICD-10-CM | POA: Diagnosis present

## 2021-10-19 NOTE — Therapy (Signed)
[place]. [place], [place], [address] Phone: [phone]   Fax:  [phone]  Physical Therapy Treatment  Patient Details  Name: John Moyer MRN: [id number] Date of Birth: [birth year] Referring Provider (PT): Dr. Maia Petties   Encounter Date: 10/19/2021   PT End of Session - 10/19/21 1515     Visit Number 5    Date for PT Re-Evaluation 11/23/21    PT Start Time 78    PT Stop Time 1515    PT Time Calculation (min) 45 min             Past Medical History:  Diagnosis Date   CAD (coronary artery disease)    Pacemaker [employer] Accolade MRI Dual chamber pacemaker 07/24/2021   Stroke [facility])    Type 2 diabetes mellitus ([place])     Past Surgical History:  Procedure Laterality Date   CORONARY ARTERY BYPASS GRAFT      There were no vitals filed for this visit.   Subjective Assessment - 10/19/21 1428     Subjective "Its going good"    Currently in Pain? No/denies                [facility] PT Assessment - 10/19/21 0001       Berg Balance Test   Sit to Stand Able to stand  independently using hands    Standing Unsupported Able to stand safely 2 minutes    Sitting with Back Unsupported but Feet Supported on Floor or Stool Able to sit safely and securely 2 minutes    Stand to Sit Sits safely with minimal use of hands    Transfers Able to transfer safely, definite need of hands    Standing Unsupported with Eyes Closed Able to stand 10 seconds with supervision    Standing Unsupported with Feet Together Able to place feet together independently and stand 1 minute safely    From Standing, Reach Forward with Outstretched Arm Can reach forward >12 cm safely (5")    From Standing Position, Pick up Object from Floor Able to pick up shoe, needs supervision    From Standing Position, Turn to Look Behind Over each Shoulder Needs supervision when turning    Turn 360 Degrees Needs close supervision or verbal  cueing    Standing Unsupported, Alternately Place Feet on Step/Stool Able to stand independently and safely and complete 8 steps in 20 seconds    Standing Unsupported, One Foot in [employer] balance while stepping or standing    Standing on One Leg Tries to lift leg/unable to hold 3 seconds but remains standing independently    Total Score 38                           [facility] Adult PT Treatment/Exercise - 10/19/21 0001       High Level Balance   High Level Balance Activities Backward walking      Knee/Hip Exercises: Aerobic   Recumbent Bike L2 x 4 min    Nustep L5 x 5 min      Knee/Hip Exercises: Machines for Strengthening   Cybex Knee Flexion 25lb 2x12      Knee/Hip Exercises: Seated   Sit to Sand 3 sets;5 reps;without UE support                       PT Short Term Goals - 10/19/21 1435  PT SHORT TERM GOAL #1   Title Will be compliant with appropriate progressive HEP    Status Achieved      PT SHORT TERM GOAL #2   Title Will be able to verbalize at least 3 strategies to prevent falls at home and in community    Status Partially Met      PT SHORT TERM GOAL #3   Status Partially Met      PT SHORT TERM GOAL #4   Title Will demonstrate improved L ankle DF/foot clearance with gait with no toe catching or foot slap in order to show improving gait mechanics    Status Partially Met               PT Long Term Goals - 09/28/21 1639       PT LONG TERM GOAL #1   Title MMT to have improved by at least one grade in all weak groups to show improvement in functional strength    Time 8    Period Weeks    Status New    Target Date 11/23/21      PT LONG TERM GOAL #2   Title Will score at least 45 on Berg to show improved functional balance skills and reduced fall risk    Time 8    Period Weeks    Status New      PT LONG TERM GOAL #3   Title Will be able tolerate at least 30 minutes of activity without increased fatigue or RPE more  than 3/10 in order to show improved functional activity tolerance/improve community access    Time 8    Period Weeks    Status New      PT LONG TERM GOAL #4   Title Will be compliant with long term gym based program (such as silver sneakers) in order to maintain long term functional gains from PT    Time 8    Period Weeks    Status New                   Plan - 10/19/21 1517     Clinical Impression Statement Pt has progressed increasing his BERG balance score. PT has most difficulty with tandem stance and SLS. Some instability with reaching outside base of support. increase time and decrease stability with turning circles. Cue to complete full ROM with leg curls and extensions. Pt did well with backwards walking.    Personal Factors and Comorbidities Age;Fitness;Past/Current Experience;Comorbidity 2;Social Background;Education;Time since onset of injury/illness/exacerbation    Comorbidities hx CVA, LOC with PEA requiring CPR x6 minutes    Examination-Activity Limitations Locomotion Level;Transfers;Bed Mobility;Carry;Squat;Stairs;Stand;Lift    Examination-Participation Restrictions Church;Cleaning;Community Activity;Yard Work;Interpersonal Relationship    Stability/Clinical Decision Making Evolving/Moderate complexity    Rehab Potential Good    PT Frequency 2x / week    PT Duration 8 weeks    PT Treatment/Interventions ADLs/Self Care Home Management;Cryotherapy;Electrical Stimulation;Iontophoresis 4mg /ml Dexamethasone;Moist Heat;Traction;Ultrasound;DME Instruction;Gait training;Stair training;Functional mobility training;Therapeutic activities;Therapeutic exercise;Balance training;Neuromuscular re-education;Patient/family education;Manual techniques;Passive range of motion;Dry needling;Energy conservation;Taping    PT Next Visit Plan f/u on process of getting AFO. Focus on strength/balance. Update HEP             Patient will benefit from skilled therapeutic intervention in  order to improve the following deficits and impairments:  Abnormal gait, Decreased coordination, Difficulty walking, Decreased endurance, Decreased safety awareness, Decreased activity tolerance, Decreased balance, Decreased knowledge of use of DME, Decreased mobility, Decreased strength, Postural dysfunction  Visit  Diagnosis: Unsteadiness on feet  Other abnormalities of gait and mobility  Muscle weakness (generalized)  History of falling     Problem List Patient Active Problem List   Diagnosis Date Noted   Type 2 diabetes mellitus ([place]) 09/02/2021   Mixed hyperlipidemia 08/12/2021   Pacemaker [place] MRI Dual chamber pacemaker 07/24/2021   Sinus node dysfunction ([place]) 07/24/2021   Encounter for care of pacemaker 07/08/2021   Coronary artery disease involving native coronary artery of native heart without angina pectoris 07/07/2021    Scot Jun, PTA 10/19/2021, 3:24 PM  [place]. [place], [place], [address] Phone: [phone]   Fax:  [phone]  Name: SHEPHERD FINNAN MRN: [id number] Date of Birth: 1956/08/23

## 2021-10-21 ENCOUNTER — Ambulatory Visit: Payer: 59 | Admitting: Physical Therapy

## 2021-10-26 ENCOUNTER — Ambulatory Visit: Payer: 59 | Admitting: Physical Therapy

## 2021-10-26 ENCOUNTER — Encounter: Payer: Self-pay | Admitting: Physical Therapy

## 2021-10-26 ENCOUNTER — Other Ambulatory Visit: Payer: Self-pay

## 2021-10-26 DIAGNOSIS — R2681 Unsteadiness on feet: Secondary | ICD-10-CM

## 2021-10-26 DIAGNOSIS — M6281 Muscle weakness (generalized): Secondary | ICD-10-CM

## 2021-10-26 DIAGNOSIS — Z9181 History of falling: Secondary | ICD-10-CM

## 2021-10-26 NOTE — Therapy (Signed)
[place] [place], [place], [address] Phone: [phone]   Fax:  [phone]  Physical Therapy Treatment  Patient Details  Name: John Moyer MRN: [id number] Date of Birth: 26-Dec-1955 Referring Provider (PT): Dr. Maia Petties   Encounter Date: 10/26/2021   PT End of Session - 10/26/21 1509     Visit Number 6    Date for PT Re-Evaluation 11/23/21    PT Start Time 1439    PT Stop Time 1515    PT Time Calculation (min) 36 min    Activity Tolerance Patient tolerated treatment well    Behavior During Therapy [facility] for tasks assessed/performed             Past Medical History:  Diagnosis Date   CAD (coronary artery disease)    Pacemaker [employer] Accolade MRI Dual chamber pacemaker 07/24/2021   Stroke ([place])    Type 2 diabetes mellitus ([place])     Past Surgical History:  Procedure Laterality Date   CORONARY ARTERY BYPASS GRAFT      There were no vitals filed for this visit.   Subjective Assessment - 10/26/21 1439     Subjective "Feeling good" No falls    Currently in Pain? No/denies                               [facility] Adult PT Treatment/Exercise - 10/26/21 0001       Ambulation/Gait   Ambulation/Gait Yes    Ambulation/Gait Assistance 5: Supervision    Ambulation Distance (Feet) 200 Feet    Assistive device None    Gait Pattern Step-through pattern    Ambulation Surface Level;Indoor      High Level Balance   High Level Balance Activities Backward walking      Knee/Hip Exercises: Aerobic   Nustep L5 x 4 min      Knee/Hip Exercises: Machines for Strengthening   Cybex Leg Press 40lb 2x10      Knee/Hip Exercises: Standing   Other Standing Knee Exercises Alt 4 in box taps 2x10   LOB x2   Other Standing Knee Exercises Seated rows green 2x15                       PT Short Term Goals - 10/19/21 1435       PT SHORT TERM GOAL #1   Title Will be compliant  with appropriate progressive HEP    Status Achieved      PT SHORT TERM GOAL #2   Title Will be able to verbalize at least 3 strategies to prevent falls at home and in community    Status Partially Met      PT SHORT TERM GOAL #3   Status Partially Met      PT SHORT TERM GOAL #4   Title Will demonstrate improved L ankle DF/foot clearance with gait with no toe catching or foot slap in order to show improving gait mechanics    Status Partially Met               PT Long Term Goals - 09/28/21 1639       PT LONG TERM GOAL #1   Title MMT to have improved by at least one grade in all weak groups to show improvement in functional strength    Time 8    Period Weeks    Status New  Target Date 11/23/21      PT LONG TERM GOAL #2   Title Will score at least 45 on Berg to show improved functional balance skills and reduced fall risk    Time 8    Period Weeks    Status New      PT LONG TERM GOAL #3   Title Will be able tolerate at least 30 minutes of activity without increased fatigue or RPE more than 3/10 in order to show improved functional activity tolerance/improve community access    Time 8    Period Weeks    Status New      PT LONG TERM GOAL #4   Title Will be compliant with long term gym based program (such as silver sneakers) in order to maintain long term functional gains from PT    Time 8    Period Weeks    Status New                   Plan - 10/26/21 1510     Clinical Impression Statement Pt ~ 9 minutes late for today's session. Pt ambulated around session without AD. Pt able to progress to two one hundred feet gait trials today under S assist. Some LOB noted with backwards walking and alt box taps. Pt able to complete leg press interventions but required increase tine due to fatigue. Postural cues needed for seated rows,    Personal Factors and Comorbidities Age;Fitness;Past/Current Experience;Comorbidity 2;Social Background;Education;Time since onset of  injury/illness/exacerbation    Comorbidities hx CVA, LOC with PEA requiring CPR x6 minutes    Examination-Activity Limitations Locomotion Level;Transfers;Bed Mobility;Carry;Squat;Stairs;Stand;Lift    Examination-Participation Restrictions Church;Cleaning;Community Activity;Yard Work;Interpersonal Relationship    Stability/Clinical Decision Making Evolving/Moderate complexity    PT Frequency 2x / week    PT Duration 8 weeks    PT Treatment/Interventions ADLs/Self Care Home Management;Cryotherapy;Electrical Stimulation;Iontophoresis 25m/ml Dexamethasone;Moist Heat;Traction;Ultrasound;DME Instruction;Gait training;Stair training;Functional mobility training;Therapeutic activities;Therapeutic exercise;Balance training;Neuromuscular re-education;Patient/family education;Manual techniques;Passive range of motion;Dry needling;Energy conservation;Taping             Patient will benefit from skilled therapeutic intervention in order to improve the following deficits and impairments:     Visit Diagnosis: Unsteadiness on feet  Muscle weakness (generalized)  History of falling     Problem List Patient Active Problem List   Diagnosis Date Noted   Type 2 diabetes mellitus ([place] 09/02/2021   Mixed hyperlipidemia 08/12/2021   Pacemaker [place] Dual chamber pacemaker 07/24/2021   Sinus node dysfunction ([place] 07/24/2021   Encounter for care of pacemaker 07/08/2021   Coronary artery disease involving native coronary artery of native heart without angina pectoris 07/07/2021    RScot Jun PTA 10/26/2021, 3:13 PM  [place] [address]Phone: [phone]  Fax:  [phone] Name: John SHIEHMRN: [id number] of Birth: [birth year]

## 2021-10-28 ENCOUNTER — Ambulatory Visit: Payer: 59 | Admitting: Physical Therapy

## 2021-10-28 ENCOUNTER — Encounter: Payer: Self-pay | Admitting: Physical Therapy

## 2021-10-28 ENCOUNTER — Other Ambulatory Visit: Payer: Self-pay

## 2021-10-28 DIAGNOSIS — R2681 Unsteadiness on feet: Secondary | ICD-10-CM | POA: Diagnosis not present

## 2021-10-28 DIAGNOSIS — R2689 Other abnormalities of gait and mobility: Secondary | ICD-10-CM

## 2021-10-28 DIAGNOSIS — Z9181 History of falling: Secondary | ICD-10-CM

## 2021-10-28 DIAGNOSIS — M6281 Muscle weakness (generalized): Secondary | ICD-10-CM

## 2021-10-28 NOTE — Therapy (Signed)
[place]. [place], [place], [address] Phone: [phone]   Fax:  [phone]  Physical Therapy Treatment  Patient Details  Name: John Moyer MRN: [id number] Date of Birth: March 20, 1956 Referring Provider (PT): Dr. Maia Petties   Encounter Date: 10/28/2021   PT End of Session - 10/28/21 1507     Visit Number 7    Date for PT Re-Evaluation 11/23/21    Authorization Type [employer]    Authorization Time Period 09/28/21 to 12/13/21    PT Start Time 1430    PT Stop Time 1511    PT Time Calculation (min) 41 min    Activity Tolerance Patient tolerated treatment well    Behavior During Therapy [facility] for tasks assessed/performed             Past Medical History:  Diagnosis Date   CAD (coronary artery disease)    Pacemaker [employer] Accolade MRI Dual chamber pacemaker 07/24/2021   Stroke ([place])    Type 2 diabetes mellitus ([place])     Past Surgical History:  Procedure Laterality Date   CORONARY ARTERY BYPASS GRAFT      There were no vitals filed for this visit.   Subjective Assessment - 10/28/21 1433     Subjective "Tired but fine after last session"    Currently in Pain? No/denies                               [facility] Adult PT Treatment/Exercise - 10/28/21 0001       Ambulation/Gait   Ambulation/Gait Yes    Ambulation/Gait Assistance 5: Supervision;4: Min guard    Ambulation Distance (Feet) 125 Feet    Assistive device None    Gait Pattern Step-through pattern    Ambulation Surface Indoor;Level    Gait Comments x2      Knee/Hip Exercises: Aerobic   Recumbent Bike L2 x 3 min    Nustep L5 x 6 min      Knee/Hip Exercises: Machines for Strengthening   Cybex Knee Extension 5lb 2x10    Cybex Knee Flexion 25lb 2x12    Cybex Leg Press 40lb 2x10      Knee/Hip Exercises: Standing   Forward Step Up Both;2 sets;5 sets;Hand Hold: 1;Step Height: 4"   CGA                       PT Short Term Goals - 10/19/21 1435       PT SHORT TERM GOAL #1   Title Will be compliant with appropriate progressive HEP    Status Achieved      PT SHORT TERM GOAL #2   Title Will be able to verbalize at least 3 strategies to prevent falls at home and in community    Status Partially Met      PT SHORT TERM GOAL #3   Status Partially Met      PT SHORT TERM GOAL #4   Title Will demonstrate improved L ankle DF/foot clearance with gait with no toe catching or foot slap in order to show improving gait mechanics    Status Partially Met               PT Long Term Goals - 09/28/21 1639       PT LONG TERM GOAL #1   Title MMT to have improved by at least one grade in all weak  groups to show improvement in functional strength    Time 8    Period Weeks    Status New    Target Date 11/23/21      PT LONG TERM GOAL #2   Title Will score at least 45 on Berg to show improved functional balance skills and reduced fall risk    Time 8    Period Weeks    Status New      PT LONG TERM GOAL #3   Title Will be able tolerate at least 30 minutes of activity without increased fatigue or RPE more than 3/10 in order to show improved functional activity tolerance/improve community access    Time 8    Period Weeks    Status New      PT LONG TERM GOAL #4   Title Will be compliant with long term gym based program (such as silver sneakers) in order to maintain long term functional gains from PT    Time 8    Period Weeks    Status New                   Plan - 10/28/21 1507     Clinical Impression Statement Pt did well with a progressed treatment session. Four inch step up did expose some instability requiring CGA at times to complete. Pt able to increase his single trial gait distance, but was unstable at times. Less fatigue noted with leg press today.    Personal Factors and Comorbidities Age;Fitness;Past/Current Experience;Comorbidity 2;Social  Background;Education;Time since onset of injury/illness/exacerbation    Examination-Activity Limitations Locomotion Level;Transfers;Bed Mobility;Carry;Squat;Stairs;Stand;Lift    Examination-Participation Restrictions Church;Cleaning;Community Activity;Yard Work;Interpersonal Relationship    Stability/Clinical Decision Making Evolving/Moderate complexity    Rehab Potential Good    PT Frequency 2x / week    PT Duration 8 weeks    PT Treatment/Interventions ADLs/Self Care Home Management;Cryotherapy;Electrical Stimulation;Iontophoresis 53m/ml Dexamethasone;Moist Heat;Traction;Ultrasound;DME Instruction;Gait training;Stair training;Functional mobility training;Therapeutic activities;Therapeutic exercise;Balance training;Neuromuscular re-education;Patient/family education;Manual techniques;Passive range of motion;Dry needling;Energy conservation;Taping    PT Next Visit Plan f/u on process of getting AFO. Focus on strength/balance. Update HEP             Patient will benefit from skilled therapeutic intervention in order to improve the following deficits and impairments:  Abnormal gait, Decreased coordination, Difficulty walking, Decreased endurance, Decreased safety awareness, Decreased activity tolerance, Decreased balance, Decreased knowledge of use of DME, Decreased mobility, Decreased strength, Postural dysfunction  Visit Diagnosis: Unsteadiness on feet  Muscle weakness (generalized)  Other abnormalities of gait and mobility  History of falling     Problem List Patient Active Problem List   Diagnosis Date Noted   Type 2 diabetes mellitus ([place] 09/02/2021   Mixed hyperlipidemia 08/12/2021   Pacemaker [place] Dual chamber pacemaker 07/24/2021   Sinus node dysfunction ([place] 07/24/2021   Encounter for care of pacemaker 07/08/2021   Coronary artery disease involving native coronary artery of native heart without angina pectoris 07/07/2021    RScot Jun PTA 10/28/2021, 3:12 PM  [place] [address]Phone: [phone]  Fax:  [phone] Name: JALVIS EDGELLMRN: [id number] of Birth: 4June 14, 1957

## 2021-11-04 ENCOUNTER — Encounter: Payer: Self-pay | Admitting: Cardiology

## 2021-11-04 ENCOUNTER — Other Ambulatory Visit: Payer: Self-pay

## 2021-11-04 ENCOUNTER — Ambulatory Visit: Payer: 59 | Admitting: Cardiology

## 2021-11-04 DIAGNOSIS — Z45018 Encounter for adjustment and management of other part of cardiac pacemaker: Secondary | ICD-10-CM

## 2021-11-04 DIAGNOSIS — I442 Atrioventricular block, complete: Secondary | ICD-10-CM

## 2021-11-04 DIAGNOSIS — Z950 Presence of cardiac pacemaker: Secondary | ICD-10-CM

## 2021-11-04 NOTE — Progress Notes (Signed)
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 Presenting ASVP @ 75/min. Pacemaker dependant:  Yes. Underlying CHB with A rate 75/min. AP 2%, VP 100%  AMS Episodes 0.  HVR 0.  Longevity 7 Years. Magnet rate: >85%. Lead measurements: Stable. Histogram: Low (L)/normal (N)/high (H)  Normal. Patient activity Good.   Observations: Normal pacemaker function. Changes: None.   Adrian Prows, MD, [facility] 11/04/2021, 4:36 PM Office: [phone] Fax: [phone] Pager: [phone]

## 2021-11-12 ENCOUNTER — Encounter: Payer: Self-pay | Admitting: Cardiology

## 2021-11-12 ENCOUNTER — Ambulatory Visit: Payer: 59 | Admitting: Cardiology

## 2021-11-12 ENCOUNTER — Other Ambulatory Visit: Payer: Self-pay

## 2021-11-12 VITALS — BP 136/79 | HR 72 | Temp 98.0°F | Resp 16 | Ht 71.0 in | Wt 189.0 lb

## 2021-11-12 DIAGNOSIS — I495 Sick sinus syndrome: Secondary | ICD-10-CM | POA: Insufficient documentation

## 2021-11-12 DIAGNOSIS — I2510 Atherosclerotic heart disease of native coronary artery without angina pectoris: Secondary | ICD-10-CM

## 2021-11-12 DIAGNOSIS — E782 Mixed hyperlipidemia: Secondary | ICD-10-CM

## 2021-11-12 MED ORDER — REPATHA SURECLICK 140 MG/ML ~~LOC~~ SOAJ: 140.0000 mg | SUBCUTANEOUS | 5 refills | Status: DC

## 2021-11-12 MED ORDER — EVOLOCUMAB 140 MG/ML ~~LOC~~ SOAJ
140.0000 mg | Freq: Once | SUBCUTANEOUS | Status: AC
  Administered 2021-11-12: 140 mg via SUBCUTANEOUS

## 2021-11-12 NOTE — Progress Notes (Signed)
Patient referred by [name], MD for coronary artery disease  Subjective:   John Moyer, male    DOB: [birth year], 66 y.o.   MRN: [id number]   Chief Complaint  Patient presents with   Coronary Artery Disease   Follow-up    3 month   Results     HPI  66 y.o. Caucasian male with coronary artery disease s/p CABG, type 2 diabetes mellitus, h/o stroke, former smoker  Patient denies chest pain, shortness of breath, palpitations, leg edema, orthopnea, PND, TIA/syncope. It appears that patient never started Repatha.   Reviewed external medical records. Discharge summary from 09/2019 at [facility] in [place].  Patient presented to the [facility] on December 14 after cardiac arrest with remarkable neurological improvement.  He reportedly had bradycardia/asystole, underwent CPR, temporary pacing and eventually a permanent pacemaker.  Further records, visit from angiogram was not deemed necessary given the patient's presentation was not with STEMI.  Of note, patient had previously undergone CABG x4 in 2019 and previously was known to have ischemic cardiomyopathy with EF of 20%.  He is not on ACE or ARB due to hyperkalemia.  Initial consultation HPI: Patient is originally from [place], moved to [place] in January 2022. Patient is here with his wife today.  Patient had extremely poor historian, so is difficult to piece history together.  It sounds like patient has had a stroke couple years ago.  In December 2020 or 2021 (there is disagreement between patient and his wife), patient underwent coronary bypass surgery.  Either before or after the surgery, wife tells me that patient "died for 6 minutes", EMS were called and he was hospitalized locally and then transferred to "[facility]" in [place], [place].  He reportedly had a kidney infection.  Again, details are very limited.  Currently, patient ambulates using a walker.  Activities  limited to walking around the house and on the deck.  With this level of activity, denies any complaints of chest pain, shortness of breath, residual significant weakness from previous stroke etc.  Patient is going to undergo colonoscopy.  He could not tell me whether this was a screening colonoscopy or because of any GI symptoms.  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 Swallow whole.     atorvastatin (LIPITOR) 80 MG tablet Take 80 mg by mouth daily.     carvedilol (COREG) 3.125 MG tablet Take 3.125 mg by mouth 2 (two) times daily with a meal.     co-enzyme Q-10 30 MG capsule Take 30 mg by mouth daily.     ferrous sulfate 324 MG TBEC Take 324 mg by mouth.     glipiZIDE (GLUCOTROL) 5 MG tablet Take 5 mg by mouth daily before breakfast.     NON FORMULARY Take 1 tablet by mouth daily. glucose health     ONETOUCH VERIO test strip 1 each daily.     No current facility-administered medications on file prior to visit.    Cardiovascular and other pertinent studies:  Echocardiogram 07/22/2021:  Left ventricle cavity is normal in size and wall thickness. Abnormal  septal wall motion due to post-operative coronary artery bypass graft.  Normal LV systolic function with EF 56%. Indeterminate diastolic filling  pattern.  Moderate tricuspid regurgitation. Estimated pulmonary artery systolic  pressure 28 mmHg.  Scheduled  In office pacemaker check 11/04/2021: Single (S)/Dual (D)/BV: D. Presenting ASVP @ 75/min. Pacemaker dependant:  Yes. Underlying CHB with A rate 75/min. AP 2%, VP 100%  AMS Episodes 0.  HVR 0.  Longevity 7 Years. Magnet rate: >85%. Lead measurements: Stable. Histogram: Low (L)/normal (N)/high (H)  Normal. Patient activity Good.   Observations: Normal pacemaker function.  Changes: None.  EKG 07/08/2021: Sinus rhythm 74 bpm Ventricular paced rhythm   Recent labs: 06/11/2021: Glucose 113, BUN/Cr 48/1.79. eGFR 41. Na/K 142/5.0. Rest of the CMP normal H/H 12/39. MCV 90. Platelets 146 Chol 148, TG 135, HDL 39, LDL 86    Review of Systems  Cardiovascular:  Negative for chest pain, dyspnea on exertion, leg swelling, palpitations and syncope.  Neurological:  Positive for loss of balance.        Vitals:   11/12/21 1031  BP: 136/79  Pulse: 72  Resp: 16  Temp: 98 F (36.7 C)  SpO2: 97%     Body mass index is 26.36 kg/m. Filed Weights   11/12/21 1031  Weight: 189 lb (85.7 kg)     Objective:   Physical Exam Vitals and nursing note reviewed.  Constitutional:      General: He is not in acute distress. Neck:     Vascular: No JVD.  Cardiovascular:     Rate and Rhythm: Normal rate and regular rhythm.     Pulses: Normal pulses.     Heart sounds: Normal heart sounds. No murmur heard. Pulmonary:     Effort: Pulmonary effort is normal.     Breath sounds: Normal breath sounds. No wheezing or rales.  Chest:     Comments: Scars of sternotomy and left upper pectoral device placement Musculoskeletal:     Right lower leg: No edema.     Left lower leg: No edema (\).         Assessment & Recommendations:   66 y.o. Caucasian male with coronary artery disease s/p CABG (2019), h/o asystole cardiac arrest and sinus node dysfunction (2020) requiring placement of dual chamber PPM, type 2 diabetes mellitus, CKD 3, h/o stroke, former smoker  CAD: S/p CABG,  Continue aspirin, statin, carvedilol. LDL 86 on atorvastatin 80 mg, ezetimibe 10 mg. Recommend addition of Repatha with gloal LDL <55 Clinic administered a sample today  Administration Action Time Recorded Time Documented By Site Comment Reason Patient Supplied  Given : 140 mg :   : Subcutaneous 11/12/21 1151 11/12/21 1151 Mares, Lesley Left Lower Abdomen NDC [phone]  No   Check lipid  panel in 3 months  H/o ischemic cardiomyopathy: EF recovered to normal (Echocardiogram 07/2021)   Sinus node dysfunction: Presentation with asystole cardiac arrest in 2020, underwent dual chamber pacemaker placement Functioning well  F/u in 3 months after repeat lipid panel   Nigel Mormon, MD Pager: [phone] Office: [phone]

## 2021-11-15 ENCOUNTER — Other Ambulatory Visit: Payer: Self-pay

## 2021-11-15 DIAGNOSIS — E782 Mixed hyperlipidemia: Secondary | ICD-10-CM

## 2021-11-15 DIAGNOSIS — I2510 Atherosclerotic heart disease of native coronary artery without angina pectoris: Secondary | ICD-10-CM

## 2021-11-15 MED ORDER — REPATHA SURECLICK 140 MG/ML ~~LOC~~ SOAJ: 140.0000 mg | SUBCUTANEOUS | 1 refills | Status: DC

## 2021-11-18 ENCOUNTER — Encounter (HOSPITAL_COMMUNITY): Payer: Self-pay | Admitting: Gastroenterology

## 2021-11-18 NOTE — Progress Notes (Signed)
Attempted to obtain medical history via telephone, unable to reach at this time. I left a voicemail to return pre surgical testing department's phone call.  

## 2021-11-22 ENCOUNTER — Other Ambulatory Visit: Payer: Self-pay | Admitting: Nephrology

## 2021-11-22 DIAGNOSIS — N1832 Chronic kidney disease, stage 3b: Secondary | ICD-10-CM

## 2021-11-25 NOTE — Anesthesia Preprocedure Evaluation (Addendum)
Anesthesia Evaluation  Patient identified by MRN, date of birth, ID band Patient awake    Reviewed: Allergy & Precautions, NPO status , Patient's Chart, lab work & pertinent test results  History of Anesthesia Complications Negative for: history of anesthetic complications  Airway Mallampati: II  TM Distance: >3 FB Neck ROM: Full    Dental  (+) Edentulous Upper, Edentulous Lower   Pulmonary former smoker,    Pulmonary exam normal        Cardiovascular hypertension, Pt. on medications + CAD and + CABG  Normal cardiovascular exam+ pacemaker (3rd degree AVB)   TTE 07/2021: EF 56%, moderate TR   Neuro/Psych CVA negative psych ROS   GI/Hepatic negative GI ROS, Neg liver ROS,   Endo/Other  diabetes, Type 2, Oral Hypoglycemic Agents  Renal/GU negative Renal ROS  negative genitourinary   Musculoskeletal negative musculoskeletal ROS (+)   Abdominal   Peds  Hematology negative hematology ROS (+)   Anesthesia Other Findings Day of surgery medications reviewed with patient.  Reproductive/Obstetrics negative OB ROS                            Anesthesia Physical Anesthesia Plan  ASA: 3  Anesthesia Plan: MAC   Post-op Pain Management: Minimal or no pain anticipated   Induction:   PONV Risk Score and Plan: Treatment may vary due to age or medical condition and Propofol infusion  Airway Management Planned: Natural Airway and Simple Face Mask  Additional Equipment: None  Intra-op Plan:   Post-operative Plan:   Informed Consent: I have reviewed the patients History and Physical, chart, labs and discussed the procedure including the risks, benefits and alternatives for the proposed anesthesia with the patient or authorized representative who has indicated his/her understanding and acceptance.       Plan Discussed with: CRNA  Anesthesia Plan Comments:        Anesthesia Quick  Evaluation

## 2021-11-26 ENCOUNTER — Ambulatory Visit (HOSPITAL_COMMUNITY)
Admission: RE | Admit: 2021-11-26 | Discharge: 2021-11-26 | Disposition: A | Discharge status: Home / Self Care | Payer: 59 | Attending: Gastroenterology | Admitting: Gastroenterology

## 2021-11-26 ENCOUNTER — Encounter (HOSPITAL_COMMUNITY)
Admission: RE | Disposition: A | Discharge status: Home / Self Care | Payer: Self-pay | Source: Home / Self Care | Attending: Gastroenterology

## 2021-11-26 ENCOUNTER — Other Ambulatory Visit: Payer: Self-pay

## 2021-11-26 ENCOUNTER — Ambulatory Visit (HOSPITAL_COMMUNITY): Payer: 59 | Admitting: Anesthesiology

## 2021-11-26 ENCOUNTER — Encounter (HOSPITAL_COMMUNITY): Payer: Self-pay | Admitting: Gastroenterology

## 2021-11-26 ENCOUNTER — Ambulatory Visit (HOSPITAL_BASED_OUTPATIENT_CLINIC_OR_DEPARTMENT_OTHER): Payer: 59 | Admitting: Anesthesiology

## 2021-11-26 DIAGNOSIS — I10 Essential (primary) hypertension: Secondary | ICD-10-CM | POA: Diagnosis not present

## 2021-11-26 DIAGNOSIS — Z1211 Encounter for screening for malignant neoplasm of colon: Secondary | ICD-10-CM | POA: Insufficient documentation

## 2021-11-26 DIAGNOSIS — Z87891 Personal history of nicotine dependence: Secondary | ICD-10-CM | POA: Diagnosis not present

## 2021-11-26 DIAGNOSIS — Z951 Presence of aortocoronary bypass graft: Secondary | ICD-10-CM | POA: Insufficient documentation

## 2021-11-26 DIAGNOSIS — Z950 Presence of cardiac pacemaker: Secondary | ICD-10-CM | POA: Insufficient documentation

## 2021-11-26 DIAGNOSIS — E119 Type 2 diabetes mellitus without complications: Secondary | ICD-10-CM | POA: Diagnosis not present

## 2021-11-26 DIAGNOSIS — D123 Benign neoplasm of transverse colon: Secondary | ICD-10-CM | POA: Diagnosis not present

## 2021-11-26 DIAGNOSIS — Z8673 Personal history of transient ischemic attack (TIA), and cerebral infarction without residual deficits: Secondary | ICD-10-CM | POA: Diagnosis not present

## 2021-11-26 DIAGNOSIS — I2510 Atherosclerotic heart disease of native coronary artery without angina pectoris: Secondary | ICD-10-CM | POA: Diagnosis not present

## 2021-11-26 DIAGNOSIS — K635 Polyp of colon: Secondary | ICD-10-CM | POA: Diagnosis not present

## 2021-11-26 HISTORY — PX: COLONOSCOPY WITH PROPOFOL: SHX5780

## 2021-11-26 HISTORY — PX: POLYPECTOMY: SHX5525

## 2021-11-26 LAB — GLUCOSE, CAPILLARY: Glucose-Capillary: 163 mg/dL — ABNORMAL HIGH (ref 70–99)

## 2021-11-26 SURGERY — COLONOSCOPY WITH PROPOFOL
Anesthesia: Monitor Anesthesia Care

## 2021-11-26 MED ORDER — PROPOFOL 500 MG/50ML IV EMUL
INTRAVENOUS | Status: AC
  Filled 2021-11-26: qty 100

## 2021-11-26 MED ORDER — SODIUM CHLORIDE 0.9 % IV SOLN: INTRAVENOUS | Status: DC

## 2021-11-26 MED ORDER — PROPOFOL 10 MG/ML IV BOLUS
INTRAVENOUS | Status: AC
  Filled 2021-11-26: qty 20

## 2021-11-26 MED ORDER — PROPOFOL 500 MG/50ML IV EMUL
INTRAVENOUS | Status: DC | PRN
  Administered 2021-11-26: 125 ug/kg/min via INTRAVENOUS

## 2021-11-26 MED ORDER — PROPOFOL 10 MG/ML IV BOLUS
INTRAVENOUS | Status: DC | PRN
  Administered 2021-11-26: 30 mg via INTRAVENOUS

## 2021-11-26 MED ORDER — PHENYLEPHRINE 40 MCG/ML (10ML) SYRINGE FOR IV PUSH (FOR BLOOD PRESSURE SUPPORT)
PREFILLED_SYRINGE | INTRAVENOUS | Status: DC | PRN
  Administered 2021-11-26 (×2): 80 ug via INTRAVENOUS

## 2021-11-26 MED ORDER — PROPOFOL 500 MG/50ML IV EMUL
INTRAVENOUS | Status: AC
  Filled 2021-11-26: qty 150

## 2021-11-26 MED ORDER — LACTATED RINGERS IV SOLN
INTRAVENOUS | Status: AC | PRN
  Administered 2021-11-26: 1000 mL

## 2021-11-26 MED ORDER — LIDOCAINE 2% (20 MG/ML) 5 ML SYRINGE
INTRAMUSCULAR | Status: DC | PRN
  Administered 2021-11-26: 80 mg via INTRAVENOUS

## 2021-11-26 SURGICAL SUPPLY — 22 items

## 2021-11-26 NOTE — Transfer of Care (Signed)
Immediate Anesthesia Transfer of Care Note  Patient: John Moyer  Procedure(s) Performed: COLONOSCOPY WITH PROPOFOL POLYPECTOMY  Patient Location: PACU  Anesthesia Type:MAC  Level of Consciousness: awake, alert  and oriented  Airway & Oxygen Therapy: Patient Spontanous Breathing and Patient connected to face mask oxygen  Post-op Assessment: Report given to RN and Post -op Vital signs reviewed and stable  Post vital signs: Reviewed and stable  Last Vitals:  Vitals Value Taken Time  BP 94/52 11/26/21 0810  Temp 36.6 C 11/26/21 0810  Pulse 67 11/26/21 0813  Resp 17 11/26/21 0813  SpO2 97 % 11/26/21 0813  Vitals shown include unvalidated device data.  Last Pain:  Vitals:   11/26/21 0810  TempSrc: Temporal  PainSc: 0-No pain         Complications: No notable events documented.

## 2021-11-26 NOTE — Discharge Instructions (Signed)

## 2021-11-26 NOTE — H&P (Signed)
John Moyer HPI: At this time the patient denies any problems with nausea, vomiting, fevers, chills, abdominal pain, diarrhea, constipation, hematochezia, melena, GERD, or dysphagia. The patient denies any known family history of colon cancers. No complaints of chest pain, SOB, MI, or sleep apnea.  Five years ago the patient suffered with a CVA and he uses a rolling walker.  Last year he had a severe renal infection and his wife reports that "he died for 6 minutes".  This occurred at home as his wife witnessed the event and she called 911.   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 No pertinent family history.  Social History:  reports that he quit smoking about 22 years ago. His smoking use included cigarettes. He has a 50.00 pack-year smoking history. He has never used smokeless tobacco. He reports that he does not currently use alcohol. He reports that he does not currently use drugs after having used the following drugs: Marijuana.  Allergies: No Known Allergies  Medications: Scheduled: Continuous:  sodium chloride      No results found for this or any previous visit (from the past 24 hour(s)).   No results found.  ROS:  As stated above in the HPI otherwise negative.  Blood pressure (!) 162/80, pulse 97, temperature 98.6 F (37 C), temperature source Oral, resp. rate (!) 97, height 5\' 10"  (1.778 m), weight 74.8 kg, SpO2 98 %.    PE: Gen: NAD, Alert and Oriented HEENT:  [place]/AT, EOMI Neck: Supple, no LAD Lungs: CTA Bilaterally CV: RRR without M/G/R ABD: Soft, NTND, +BS Ext: No C/C/E  Assessment/Plan: 1) Screening colonoscopy.  John Moyer D 11/26/2021,  7:26 AM

## 2021-11-26 NOTE — Anesthesia Postprocedure Evaluation (Signed)
Anesthesia Post Note  Patient: John Moyer  Procedure(s) Performed: COLONOSCOPY WITH PROPOFOL POLYPECTOMY     Patient location during evaluation: PACU Anesthesia Type: MAC Level of consciousness: awake and alert Pain management: pain level controlled Vital Signs Assessment: post-procedure vital signs reviewed and stable Respiratory status: spontaneous breathing, nonlabored ventilation and respiratory function stable Cardiovascular status: blood pressure returned to baseline Postop Assessment: no apparent nausea or vomiting Anesthetic complications: no   No notable events documented.  Last Vitals:  Vitals:   11/26/21 0820 11/26/21 0830  BP: (!) 114/56 122/80  Pulse: 69 81  Resp: 16 15  Temp:    SpO2: 97% 94%    Last Pain:  Vitals:   11/26/21 0830  TempSrc:   PainSc: 0-No pain                 Marthenia Rolling

## 2021-11-26 NOTE — Op Note (Signed)
[facility] Patient Name: John Moyer Procedure Date: 11/26/2021 MRN: [id number] Attending MD: Carol Ada , MD Date of Birth: October 07, 1956 CSN: [id number] Age: 66 Admit Type: Outpatient Procedure:                Colonoscopy Indications:              Screening for colorectal malignant neoplasm Providers:                Carol Ada, MD, Mikey College, RN, Benetta Spar, Technician Referring MD:              Medicines:                Propofol per Anesthesia Complications:            No immediate complications. Estimated Blood Loss:     Estimated blood loss: none. Procedure:                Pre-Anesthesia Assessment:                           - Prior to the procedure, a History and Physical                            was performed, and patient medications and                            allergies were reviewed. The patient's tolerance of                            previous anesthesia was also reviewed. The risks                            and benefits of the procedure and the sedation                            options and risks were discussed with the patient.                            All questions were answered, and informed consent                            was obtained. Prior Anticoagulants: The patient has                            taken no previous anticoagulant or antiplatelet                            agents. ASA Grade Assessment: III - A patient with                            severe systemic disease. After reviewing the risks  and benefits, the patient was deemed in                            satisfactory condition to undergo the procedure.                           - Sedation was administered by an anesthesia                            professional. Deep sedation was attained.                           After obtaining informed consent, the colonoscope                            was passed under direct  vision. Throughout the                            procedure, the patient's blood pressure, pulse, and                            oxygen saturations were monitored continuously. The                            CF-HQ190L ([id number] Olympus colonoscope was                            introduced through the anus and advanced to the the                            cecum, identified by appendiceal orifice and                            ileocecal valve. The colonoscopy was performed                            without difficulty. The patient tolerated the                            procedure well. The quality of the bowel                            preparation was evaluated using the BBPS [facility]                            Bowel Preparation Scale) with scores of: Right                            Colon = 3, Transverse Colon = 3 and Left Colon = 3                            (entire mucosa seen well with no residual staining,  small fragments of stool or opaque liquid). The                            total BBPS score equals 9. The ileocecal valve,                            appendiceal orifice, and rectum were photographed. Scope In: 7:48:19 AM Scope Out: 8:02:44 AM Scope Withdrawal Time: 0 hours 10 minutes 49 seconds  Total Procedure Duration: 0 hours 14 minutes 25 seconds  Findings:      A 2 mm polyp was found in the transverse colon. The polyp was sessile.       The polyp was removed with a cold snare. Resection and retrieval were       complete. Impression:               - One 2 mm polyp in the transverse colon, removed                            with a cold snare. Resected and retrieved. Moderate Sedation:      Not Applicable - Patient had care per Anesthesia. Recommendation:           - Patient has a contact number available for                            emergencies. The signs and symptoms of potential                            delayed complications were discussed  with the                            patient. Return to normal activities tomorrow.                            Written discharge instructions were provided to the                            patient.                           - Resume previous diet.                           - Continue present medications.                           - Await pathology results.                           - Repeat colonoscopy in 7 years for surveillance. Procedure Code(s):        --- Professional ---                           [phone], Colonoscopy, flexible; with removal of  tumor(s), polyp(s), or other lesion(s) by snare                            technique Diagnosis Code(s):        --- Professional ---                           Z12.11, Encounter for screening for malignant                            neoplasm of colon                           K63.5, Polyp of colon CPT copyright 2019 [employer]. All rights reserved. The codes documented in this report are preliminary and upon coder review may  be revised to meet current compliance requirements. Carol Ada, MD Carol Ada, MD 11/26/2021 8:12:06 AM This report has been signed electronically. Number of Addenda: 0

## 2021-11-29 ENCOUNTER — Encounter (HOSPITAL_COMMUNITY): Payer: Self-pay | Admitting: Gastroenterology

## 2021-11-29 LAB — SURGICAL PATHOLOGY

## 2021-12-06 ENCOUNTER — Ambulatory Visit: Payer: 59 | Admitting: Podiatry

## 2021-12-08 ENCOUNTER — Other Ambulatory Visit: Payer: Self-pay

## 2021-12-08 ENCOUNTER — Encounter: Payer: Self-pay | Admitting: Podiatry

## 2021-12-08 ENCOUNTER — Ambulatory Visit (INDEPENDENT_AMBULATORY_CARE_PROVIDER_SITE_OTHER): Payer: 59 | Admitting: Podiatry

## 2021-12-08 DIAGNOSIS — B351 Tinea unguium: Secondary | ICD-10-CM

## 2021-12-08 DIAGNOSIS — M79675 Pain in left toe(s): Secondary | ICD-10-CM

## 2021-12-08 DIAGNOSIS — L84 Corns and callosities: Secondary | ICD-10-CM | POA: Diagnosis not present

## 2021-12-08 DIAGNOSIS — M79674 Pain in right toe(s): Secondary | ICD-10-CM

## 2021-12-08 DIAGNOSIS — E119 Type 2 diabetes mellitus without complications: Secondary | ICD-10-CM | POA: Diagnosis not present

## 2021-12-14 NOTE — Progress Notes (Signed)
°  Subjective:  Patient ID: John Moyer, male    DOB: [birth year],  MRN: [id number]  John Moyer presents to clinic today for preventative diabetic foot care and callus(es) left foot and painful thick toenails that are difficult to trim. Painful toenails interfere with ambulation. Aggravating factors include wearing enclosed shoe gear. Pain is relieved with periodic professional debridement. Painful calluses are aggravated when weightbearing with and without shoegear. Pain is relieved with periodic professional debridement.  Patient states blood glucose was 103 mg/dl today.    Patient is accompanied by his wife on today's visit.  New problem(s): None.   PCP is Bakare, Mobolaji B, MD , and last visit was last week.  No Known Allergies  Review of Systems: Negative except as noted in the HPI. Objective:   Constitutional John Moyer is a pleasant 66 y.o. Caucasian male, in NAD. AAO x 3.   Vascular Capillary refill time to digits immediate b/l. Palpable pedal pulses b/l LE. Pedal hair absent. No pain with calf compression b/l. Lower extremity skin temperature gradient within normal limits. No edema noted b/l LE. No cyanosis or clubbing noted b/l LE.  Neurologic Normal speech. Oriented to person, place, and time. Protective sensation intact 5/5 intact bilaterally with 10g monofilament b/l. Vibratory sensation intact b/l.  Dermatologic Pedal integument with normal turgor, texture and tone BLE. No open wounds b/l LE. Toenails 1-5 b/l elongated, discolored, dystrophic, thickened, crumbly with subungual debris and tenderness to dorsal palpation. Hyperkeratotic lesion(s) submet head 5 left foot.  No erythema, no edema, no drainage, no fluctuance.  Orthopedic: Muscle strength 5/5 to all LE muscle groups of right lower extremity. Inversion deformity of left ankle. Utilizes rollator for ambulation assistance.   Radiographs: None  Last A1c: No flowsheet data found.   Assessment:   1. Pain due to  onychomycosis of toenails of both feet   2. Callus   3. Type 2 diabetes mellitus without complication, without long-term current use of insulin ([place])    Plan:  Patient was evaluated and treated and all questions answered. Consent given for treatment as described below: -Mycotic toenails 1-5 bilaterally were debrided in length and girth with sterile nail nippers and dremel without incident. -Callus(es) submet head 5 left foot pared utilizing sterile scalpel blade without complication or incident. Total number debrided =1. -Patient/POA to call should there be question/concern in the interim.  Return in about 3 months (around 03/07/2022).  Marzetta Board, DPM

## 2022-01-05 ENCOUNTER — Emergency Department (HOSPITAL_BASED_OUTPATIENT_CLINIC_OR_DEPARTMENT_OTHER): Payer: 59

## 2022-01-05 ENCOUNTER — Other Ambulatory Visit: Payer: Self-pay

## 2022-01-05 ENCOUNTER — Encounter (HOSPITAL_BASED_OUTPATIENT_CLINIC_OR_DEPARTMENT_OTHER): Payer: Self-pay

## 2022-01-05 ENCOUNTER — Inpatient Hospital Stay (HOSPITAL_BASED_OUTPATIENT_CLINIC_OR_DEPARTMENT_OTHER)
Admission: EM | Admit: 2022-01-05 | Discharge: 2022-01-06 | DRG: 689 | Disposition: A | Discharge status: Home / Self Care | Payer: 59 | Attending: Internal Medicine | Admitting: Internal Medicine

## 2022-01-05 DIAGNOSIS — Z951 Presence of aortocoronary bypass graft: Secondary | ICD-10-CM

## 2022-01-05 DIAGNOSIS — N179 Acute kidney failure, unspecified: Secondary | ICD-10-CM

## 2022-01-05 DIAGNOSIS — R3589 Other polyuria: Secondary | ICD-10-CM | POA: Diagnosis not present

## 2022-01-05 DIAGNOSIS — Z8673 Personal history of transient ischemic attack (TIA), and cerebral infarction without residual deficits: Secondary | ICD-10-CM

## 2022-01-05 DIAGNOSIS — Z79899 Other long term (current) drug therapy: Secondary | ICD-10-CM

## 2022-01-05 DIAGNOSIS — Z87891 Personal history of nicotine dependence: Secondary | ICD-10-CM

## 2022-01-05 DIAGNOSIS — Z91120 Patient's intentional underdosing of medication regimen due to financial hardship: Secondary | ICD-10-CM

## 2022-01-05 DIAGNOSIS — I2510 Atherosclerotic heart disease of native coronary artery without angina pectoris: Secondary | ICD-10-CM | POA: Diagnosis not present

## 2022-01-05 DIAGNOSIS — I10 Essential (primary) hypertension: Secondary | ICD-10-CM

## 2022-01-05 DIAGNOSIS — E785 Hyperlipidemia, unspecified: Secondary | ICD-10-CM

## 2022-01-05 DIAGNOSIS — Z7984 Long term (current) use of oral hypoglycemic drugs: Secondary | ICD-10-CM

## 2022-01-05 DIAGNOSIS — E1110 Type 2 diabetes mellitus with ketoacidosis without coma: Secondary | ICD-10-CM | POA: Diagnosis not present

## 2022-01-05 DIAGNOSIS — N3000 Acute cystitis without hematuria: Principal | ICD-10-CM | POA: Diagnosis present

## 2022-01-05 DIAGNOSIS — Z7982 Long term (current) use of aspirin: Secondary | ICD-10-CM

## 2022-01-05 DIAGNOSIS — T383X6A Underdosing of insulin and oral hypoglycemic [antidiabetic] drugs, initial encounter: Secondary | ICD-10-CM | POA: Diagnosis present

## 2022-01-05 DIAGNOSIS — I129 Hypertensive chronic kidney disease with stage 1 through stage 4 chronic kidney disease, or unspecified chronic kidney disease: Secondary | ICD-10-CM | POA: Diagnosis present

## 2022-01-05 DIAGNOSIS — Z950 Presence of cardiac pacemaker: Secondary | ICD-10-CM

## 2022-01-05 DIAGNOSIS — E860 Dehydration: Secondary | ICD-10-CM | POA: Diagnosis present

## 2022-01-05 DIAGNOSIS — N390 Urinary tract infection, site not specified: Secondary | ICD-10-CM | POA: Diagnosis not present

## 2022-01-05 DIAGNOSIS — I959 Hypotension, unspecified: Secondary | ICD-10-CM | POA: Diagnosis present

## 2022-01-05 DIAGNOSIS — E1122 Type 2 diabetes mellitus with diabetic chronic kidney disease: Secondary | ICD-10-CM | POA: Diagnosis present

## 2022-01-05 DIAGNOSIS — Z20822 Contact with and (suspected) exposure to covid-19: Secondary | ICD-10-CM | POA: Diagnosis present

## 2022-01-05 DIAGNOSIS — N1832 Chronic kidney disease, stage 3b: Secondary | ICD-10-CM | POA: Diagnosis present

## 2022-01-05 DIAGNOSIS — N189 Chronic kidney disease, unspecified: Secondary | ICD-10-CM

## 2022-01-05 DIAGNOSIS — Z9049 Acquired absence of other specified parts of digestive tract: Secondary | ICD-10-CM

## 2022-01-05 LAB — BASIC METABOLIC PANEL
Anion gap: 15 (ref 5–15)
Anion gap: 12 (ref 5–15)
BUN: 77 mg/dL — ABNORMAL HIGH (ref 8–23)
BUN: 76 mg/dL — ABNORMAL HIGH (ref 8–23)
CO2: 18 mmol/L — ABNORMAL LOW (ref 22–32)
CO2: 23 mmol/L (ref 22–32)
Calcium: 9.9 mg/dL (ref 8.9–10.3)
Calcium: 10 mg/dL (ref 8.9–10.3)
Chloride: 96 mmol/L — ABNORMAL LOW (ref 98–111)
Chloride: 102 mmol/L (ref 98–111)
Creatinine, Ser: 2.89 mg/dL — ABNORMAL HIGH (ref 0.61–1.24)
Creatinine, Ser: 2.74 mg/dL — ABNORMAL HIGH (ref 0.61–1.24)
GFR, Estimated: 24 mL/min — ABNORMAL LOW (ref >60)
GFR, Estimated: 25 mL/min — ABNORMAL LOW (ref >60)
Glucose, Bld: 462 mg/dL — ABNORMAL HIGH (ref 70–99)
Glucose, Bld: 211 mg/dL — ABNORMAL HIGH (ref 70–99)
Potassium: 5.7 mmol/L — ABNORMAL HIGH (ref 3.5–5.1)
Potassium: 3.9 mmol/L (ref 3.5–5.1)
Sodium: 129 mmol/L — ABNORMAL LOW (ref 135–145)
Sodium: 137 mmol/L (ref 135–145)

## 2022-01-05 LAB — URINALYSIS, ROUTINE W REFLEX MICROSCOPIC
Bilirubin Urine: NEGATIVE
Glucose, UA: >1000 mg/dL — AB
Ketones, ur: NEGATIVE mg/dL
Nitrite: NEGATIVE
Specific Gravity, Urine: 1.025 (ref 1.005–1.030)
WBC, UA: >50 WBC/hpf — ABNORMAL HIGH (ref 0–5)
pH: 5 (ref 5.0–8.0)

## 2022-01-05 LAB — CBC
HCT: 46.7 % (ref 39.0–52.0)
Hemoglobin: 15.7 g/dL (ref 13.0–17.0)
MCH: 28.5 pg (ref 26.0–34.0)
MCHC: 33.6 g/dL (ref 30.0–36.0)
MCV: 84.8 fL (ref 80.0–100.0)
Platelets: 170 10*3/uL (ref 150–400)
RBC: 5.51 MIL/uL (ref 4.22–5.81)
RDW: 12.1 % (ref 11.5–15.5)
WBC: 10.8 10*3/uL — ABNORMAL HIGH (ref 4.0–10.5)
nRBC: 0 % (ref 0.0–0.2)

## 2022-01-05 LAB — I-STAT VENOUS BLOOD GAS, ED
Acid-base deficit: 2 mmol/L (ref 0.0–2.0)
Bicarbonate: 24.8 mmol/L (ref 20.0–28.0)
Calcium, Ion: 1.29 mmol/L (ref 1.15–1.40)
HCT: 44 % (ref 39.0–52.0)
Hemoglobin: 15 g/dL (ref 13.0–17.0)
O2 Saturation: 41 %
Potassium: 4.4 mmol/L (ref 3.5–5.1)
Sodium: 137 mmol/L (ref 135–145)
TCO2: 26 mmol/L (ref 22–32)
pCO2, Ven: 50.3 mmHg (ref 44–60)
pH, Ven: 7.302 (ref 7.25–7.43)
pO2, Ven: 26 mmHg — CL (ref 32–45)

## 2022-01-05 LAB — LACTIC ACID, PLASMA: Lactic Acid, Venous: 2.3 mmol/L (ref 0.5–1.9)

## 2022-01-05 LAB — CBG MONITORING, ED
Glucose-Capillary: 525 mg/dL (ref 70–99)
Glucose-Capillary: 243 mg/dL — ABNORMAL HIGH (ref 70–99)
Glucose-Capillary: 209 mg/dL — ABNORMAL HIGH (ref 70–99)
Glucose-Capillary: 184 mg/dL — ABNORMAL HIGH (ref 70–99)
Glucose-Capillary: 121 mg/dL — ABNORMAL HIGH (ref 70–99)

## 2022-01-05 LAB — RESP PANEL BY RT-PCR (FLU A&B, COVID) ARPGX2
Influenza A by PCR: NEGATIVE
Influenza B by PCR: NEGATIVE
SARS Coronavirus 2 by RT PCR: NEGATIVE

## 2022-01-05 LAB — BETA-HYDROXYBUTYRIC ACID: Beta-Hydroxybutyric Acid: 0.83 mmol/L — ABNORMAL HIGH (ref 0.05–0.27)

## 2022-01-05 MED ORDER — SODIUM CHLORIDE 0.9 % IV SOLN
1.0000 g | Freq: Once | INTRAVENOUS | Status: AC
  Administered 2022-01-05: 1 g via INTRAVENOUS
  Filled 2022-01-05: qty 10

## 2022-01-05 MED ORDER — SODIUM CHLORIDE 0.9 % IV BOLUS
1000.0000 mL | Freq: Once | INTRAVENOUS | Status: AC
  Administered 2022-01-05: 1000 mL via INTRAVENOUS

## 2022-01-05 MED ORDER — DEXTROSE IN LACTATED RINGERS 5 % IV SOLN: INTRAVENOUS | Status: DC

## 2022-01-05 MED ORDER — DEXTROSE 50 % IV SOLN: 0.0000 mL | INTRAVENOUS | Status: DC | PRN

## 2022-01-05 MED ORDER — INSULIN REGULAR HUMAN 100 UNIT/ML IJ SOLN
8.0000 [IU] | Freq: Once | INTRAMUSCULAR | Status: DC
  Filled 2022-01-05: qty 3

## 2022-01-05 MED ORDER — INSULIN REGULAR(HUMAN) IN NACL 100-0.9 UT/100ML-% IV SOLN
INTRAVENOUS | Status: DC
  Administered 2022-01-05: 3.2 [IU]/h via INTRAVENOUS
  Administered 2022-01-05: 0.3 [IU]/h via INTRAVENOUS
  Administered 2022-01-05: 4.6 [IU]/h via INTRAVENOUS
  Filled 2022-01-05: qty 100

## 2022-01-05 MED ORDER — LACTATED RINGERS IV BOLUS
20.0000 mL/kg | Freq: Once | INTRAVENOUS | Status: AC
  Administered 2022-01-05: 1496 mL via INTRAVENOUS

## 2022-01-05 MED ORDER — INSULIN ASPART 100 UNIT/ML IJ SOLN
8.0000 [IU] | Freq: Once | INTRAMUSCULAR | Status: AC
  Administered 2022-01-05: 8 [IU] via INTRAVENOUS

## 2022-01-05 MED ORDER — LACTATED RINGERS IV SOLN: INTRAVENOUS | Status: DC

## 2022-01-05 NOTE — ED Notes (Signed)
Called [facility] to transport patient to [place] room 19 ?

## 2022-01-05 NOTE — ED Notes (Signed)
RN received call from lab stating patients BMP was slightly hemolyzed so labs will reflect that.  ?

## 2022-01-05 NOTE — ED Notes (Signed)
Patient's daughter called and updated on room status.  ?

## 2022-01-05 NOTE — ED Notes (Signed)
John Moyer (daughter)- [phone], [phone] call with plans of admission or d/c.  ?

## 2022-01-05 NOTE — ED Triage Notes (Signed)
Patient here POV from Home with Fatigue. ? ?Patient endorses Fatigue for approximately 1 week. Symptoms have been worsening since they began. Did have recent change in Diabetes Medication. CBG of "488" at Home.  ? ?No Fevers. No N/V/D. ? ?NAD Noted during Triage. A&Ox4. GCS 15. Ambulatory. ?

## 2022-01-05 NOTE — ED Provider Notes (Signed)
?[facility] ?Provider Note ? ? ?CSN: [id number] ?Arrival date & time: 01/05/22  1713 ? ?  ? ?History ? ?Chief Complaint  ?Patient presents with  ? Fatigue  ? ? ?John Moyer is a 66 y.o. male. ? ?Pt is a 66 yo male with a pmhx significant for dm, cad, and cva who has been feeling fatigued for a week.  Pt said his pcp changed his dm medication to trulicity.  He said his insurance denied coverage, but this was never communicated to his doctor.  So, he has not been on any dm meds for 4 weeks.  Pt followed up with his pcp today.  While at the office, bs was reading "hi" and his bp was in the 80s.  His pcp sent him over here for further eval.  Pt's wife said he's not been eating a diabetic diet at home.  ? ? ?  ? ?Home Medications ?Prior to Admission medications   ?Medication Sig Start Date End Date Taking? Authorizing Provider  ?alum & mag hydroxide-simeth (MAALOX/MYLANTA) 200-200-20 MG/5ML suspension Take 15 mLs by mouth every 6 (six) hours as needed for indigestion or heartburn.    [provider]  ?amLODipine (NORVASC) 2.5 MG tablet Take 2.5 mg by mouth daily.    [provider]  ?aspirin EC 81 MG tablet Take 81 mg by mouth daily. Swallow whole.    [provider]  ?atorvastatin (LIPITOR) 80 MG tablet Take 80 mg by mouth daily.    [provider]  ?carvedilol (COREG) 3.125 MG tablet Take 3.125 mg by mouth 2 (two) times daily with a meal.    [provider]  ?Coenzyme Q10 (COQ-10 PO) Take 1 tablet by mouth daily.    [provider]  ?Evolocumab (REPATHA SURECLICK) 465 MG/ML SOAJ Inject 140 mg into the skin every 14 (fourteen) days. 11/15/21   Patwardhan, Reynold Bowen, MD  ?ferrous sulfate 325 (65 FE) MG tablet Take 325 mg by mouth daily.    [provider]  ?glipiZIDE (GLUCOTROL XL) 5 MG 24 hr tablet Take 5 mg by mouth daily.    [provider]  ?Roma Schanz test strip 1 each daily. 08/26/21   [provider]  ?    ? ?Allergies    ?Patient has no known allergies.   ? ?Review of Systems   ?Review of Systems  ?Neurological:  Positive for weakness.  ?All other systems reviewed and are negative. ? ?Physical Exam ?Updated Vital Signs ?BP 133/79   Pulse 74   Temp 98.3 ?F (36.8 ?C)   Resp 16   Ht '5\' 10"'$  (1.778 m)   Wt 74.8 kg   SpO2 96%   BMI 23.66 kg/m?  ?Physical Exam ?Vitals and nursing note reviewed.  ?Constitutional:   ?   Appearance: Normal appearance.  ?HENT:  ?   Head: Normocephalic and atraumatic.  ?   Right Ear: External ear normal.  ?   Left Ear: External ear normal.  ?   Nose: Nose normal.  ?   Mouth/Throat:  ?   Mouth: Mucous membranes are dry.  ?Eyes:  ?   Extraocular Movements: Extraocular movements intact.  ?   Conjunctiva/sclera: Conjunctivae normal.  ?   Pupils: Pupils are equal, round, and reactive to light.  ?Cardiovascular:  ?   Rate and Rhythm: Normal rate and regular rhythm.  ?   Pulses: Normal pulses.  ?   Heart sounds: Normal heart sounds.  ?Pulmonary:  ?  Effort: Pulmonary effort is normal.  ?   Breath sounds: Normal breath sounds.  ?Abdominal:  ?   General: Abdomen is flat. Bowel sounds are normal.  ?   Palpations: Abdomen is soft.  ?Musculoskeletal:     ?   General: Normal range of motion.  ?   Cervical back: Normal range of motion and neck supple.  ?Skin: ?   General: Skin is warm.  ?   Capillary Refill: Capillary refill takes less than 2 seconds.  ?Neurological:  ?   General: No focal deficit present.  ?   Mental Status: He is alert and oriented to person, place, and time.  ?Psychiatric:     ?   Mood and Affect: Mood normal.     ?   Behavior: Behavior normal.  ? ? ?ED Results / Procedures / Treatments   ?Labs ?(all labs ordered are listed, but only abnormal results are displayed) ?Labs Reviewed  ?BASIC METABOLIC PANEL - Abnormal; Notable for the following components:  ?    Result Value  ? Sodium 129 (*)   ? Potassium 5.7 (*)   ? Chloride 96 (*)   ? CO2 18 (*)   ? Glucose, Bld 462 (*)   ? BUN 77  (*)   ? Creatinine, Ser 2.89 (*)   ? GFR, Estimated 24 (*)   ? All other components within normal limits  ?CBC - Abnormal; Notable for the following components:  ? WBC 10.8 (*)   ? All other components within normal limits  ?URINALYSIS, ROUTINE W REFLEX MICROSCOPIC - Abnormal; Notable for the following components:  ? APPearance HAZY (*)   ? Glucose, UA >1,000 (*)   ? Hgb urine dipstick SMALL (*)   ? Protein, ur TRACE (*)   ? Leukocytes,Ua LARGE (*)   ? WBC, UA >50 (*)   ? Bacteria, UA FEW (*)   ? All other components within normal limits  ?CBG MONITORING, ED - Abnormal; Notable for the following components:  ? Glucose-Capillary 525 (*)   ? All other components within normal limits  ?I-STAT VENOUS BLOOD GAS, ED - Abnormal; Notable for the following components:  ? pO2, Ven 26 (*)   ? All other components within normal limits  ?CBG MONITORING, ED - Abnormal; Notable for the following components:  ? Glucose-Capillary 243 (*)   ? All other components within normal limits  ?CBG MONITORING, ED - Abnormal; Notable for the following components:  ? Glucose-Capillary 209 (*)   ? All other components within normal limits  ?CBG MONITORING, ED - Abnormal; Notable for the following components:  ? Glucose-Capillary 184 (*)   ? All other components within normal limits  ?RESP PANEL BY RT-PCR (FLU A&B, COVID) [id number]  ?CULTURE, BLOOD (ROUTINE X 2)  ?CULTURE, BLOOD (ROUTINE X 2)  ?URINE CULTURE  ?BLOOD GAS, VENOUS  ?BETA-HYDROXYBUTYRIC ACID  ?BASIC METABOLIC PANEL  ?LACTIC ACID, PLASMA  ?LACTIC ACID, PLASMA  ? ? ?EKG ?EKG Interpretation ? ?Date/Time:  Wednesday January 05 2022 19:18:44 EDT ?Ventricular Rate:  76 ?PR Interval:  276 ?QRS Duration: 178 ?QT Interval:  437 ?QTC Calculation: 492 ?R Axis:   107 ?Text Interpretation: Sinus or ectopic atrial rhythm Prolonged PR interval Consider left ventricular hypertrophy Repol abnrm suggests ischemia, diffuse leads Baseline wander in lead(s) V4 V5 V6 No old tracing to compare Confirmed by  Isla Pence [phone]) on 01/05/2022 7:28:01 PM ? ?Radiology ?DG Chest Portable 1 View ? ?Result Date: 01/05/2022 ?CLINICAL DATA:  sob EXAM: PORTABLE CHEST  1 VIEW COMPARISON:  None. 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 The heart and mediastinal contours are within normal limits. No focal consolidation. No pulmonary edema. No pleural effusion. No pneumothorax. No acute osseous abnormality. IMPRESSION: Left chest wall 2 lead pacemaker with ventricular lead appearing slightly more superior than expected. Recommend correlation with prior chest x-ray or cross-sectional imaging. Electronically Signed   By: Iven Finn M.D.   On: 01/05/2022 19:48   ? ?Procedures ?Procedures  ? ? ?Medications Ordered in ED ?Medications  ?lactated ringers bolus 1,496 mL (has no administration in time range)  ?insulin regular, human (MYXREDLIN) 100 units/ 100 mL infusion (4.6 Units/hr Intravenous New Bag/Given 01/05/22 2106)  ?lactated ringers infusion (0 mLs Intravenous Hold 01/05/22 2053)  ?dextrose 5 % in lactated ringers infusion ( Intravenous New Bag/Given 01/05/22 2059)  ?dextrose 50 % solution 0-50 mL (has no administration in time range)  ?cefTRIAXone (ROCEPHIN) 1 g in sodium chloride 0.9 % 100 mL IVPB (1 g Intravenous New Bag/Given 01/05/22 2109)  ?sodium chloride 0.9 % bolus 1,000 mL (0 mLs Intravenous Stopped 01/05/22 1953)  ?insulin aspart (novoLOG) injection 8 Units (8 Units Intravenous Given 01/05/22 1949)  ? ? ?ED Course/ Medical Decision Making/ A&P ?  ?                        ?Medical Decision Making ?Amount and/or Complexity of Data Reviewed ?Labs: ordered. ?Radiology: ordered. ? ?Risk ?OTC drugs. ?Prescription drug management. ?Decision regarding hospitalization. ? ? ?This patient presents to the ED for concern of elevated bs, this involves an extensive number of treatment options, and is a complaint that carries with it a high risk of complications  and morbidity.  The differential diagnosis includes dka, hyperosmolar hyperglycemic syndrome, infection, dehydration ? ? ?Co morbidities that complicate the patient evaluation ? ?dm, cad, and cva ? ? ?Additional

## 2022-01-06 DIAGNOSIS — Z91120 Patient's intentional underdosing of medication regimen due to financial hardship: Secondary | ICD-10-CM | POA: Diagnosis not present

## 2022-01-06 DIAGNOSIS — N3000 Acute cystitis without hematuria: Secondary | ICD-10-CM | POA: Diagnosis present

## 2022-01-06 DIAGNOSIS — Z9049 Acquired absence of other specified parts of digestive tract: Secondary | ICD-10-CM | POA: Diagnosis not present

## 2022-01-06 DIAGNOSIS — Z87891 Personal history of nicotine dependence: Secondary | ICD-10-CM | POA: Diagnosis not present

## 2022-01-06 DIAGNOSIS — Z950 Presence of cardiac pacemaker: Secondary | ICD-10-CM | POA: Diagnosis not present

## 2022-01-06 DIAGNOSIS — N179 Acute kidney failure, unspecified: Secondary | ICD-10-CM

## 2022-01-06 DIAGNOSIS — Z7982 Long term (current) use of aspirin: Secondary | ICD-10-CM | POA: Diagnosis not present

## 2022-01-06 DIAGNOSIS — N390 Urinary tract infection, site not specified: Secondary | ICD-10-CM | POA: Diagnosis not present

## 2022-01-06 DIAGNOSIS — E860 Dehydration: Secondary | ICD-10-CM | POA: Diagnosis present

## 2022-01-06 DIAGNOSIS — Z7984 Long term (current) use of oral hypoglycemic drugs: Secondary | ICD-10-CM | POA: Diagnosis not present

## 2022-01-06 DIAGNOSIS — N1832 Chronic kidney disease, stage 3b: Secondary | ICD-10-CM | POA: Diagnosis present

## 2022-01-06 DIAGNOSIS — Z951 Presence of aortocoronary bypass graft: Secondary | ICD-10-CM | POA: Diagnosis not present

## 2022-01-06 DIAGNOSIS — E1122 Type 2 diabetes mellitus with diabetic chronic kidney disease: Secondary | ICD-10-CM | POA: Diagnosis present

## 2022-01-06 DIAGNOSIS — Z79899 Other long term (current) drug therapy: Secondary | ICD-10-CM | POA: Diagnosis not present

## 2022-01-06 DIAGNOSIS — E785 Hyperlipidemia, unspecified: Secondary | ICD-10-CM

## 2022-01-06 DIAGNOSIS — Z8673 Personal history of transient ischemic attack (TIA), and cerebral infarction without residual deficits: Secondary | ICD-10-CM | POA: Diagnosis not present

## 2022-01-06 DIAGNOSIS — Z20822 Contact with and (suspected) exposure to covid-19: Secondary | ICD-10-CM | POA: Diagnosis present

## 2022-01-06 DIAGNOSIS — E1110 Type 2 diabetes mellitus with ketoacidosis without coma: Secondary | ICD-10-CM | POA: Diagnosis present

## 2022-01-06 DIAGNOSIS — I959 Hypotension, unspecified: Secondary | ICD-10-CM | POA: Diagnosis present

## 2022-01-06 DIAGNOSIS — I2510 Atherosclerotic heart disease of native coronary artery without angina pectoris: Secondary | ICD-10-CM | POA: Diagnosis present

## 2022-01-06 DIAGNOSIS — I10 Essential (primary) hypertension: Secondary | ICD-10-CM

## 2022-01-06 DIAGNOSIS — T383X6A Underdosing of insulin and oral hypoglycemic [antidiabetic] drugs, initial encounter: Secondary | ICD-10-CM | POA: Diagnosis present

## 2022-01-06 DIAGNOSIS — R3589 Other polyuria: Secondary | ICD-10-CM | POA: Diagnosis present

## 2022-01-06 DIAGNOSIS — I129 Hypertensive chronic kidney disease with stage 1 through stage 4 chronic kidney disease, or unspecified chronic kidney disease: Secondary | ICD-10-CM | POA: Diagnosis present

## 2022-01-06 LAB — BASIC METABOLIC PANEL
Anion gap: 10 (ref 5–15)
Anion gap: 12 (ref 5–15)
BUN: 60 mg/dL — ABNORMAL HIGH (ref 8–23)
BUN: 68 mg/dL — ABNORMAL HIGH (ref 8–23)
CO2: 17 mmol/L — ABNORMAL LOW (ref 22–32)
CO2: 23 mmol/L (ref 22–32)
Calcium: 8.8 mg/dL — ABNORMAL LOW (ref 8.9–10.3)
Calcium: 8.9 mg/dL (ref 8.9–10.3)
Chloride: 105 mmol/L (ref 98–111)
Chloride: 108 mmol/L (ref 98–111)
Creatinine, Ser: 2.13 mg/dL — ABNORMAL HIGH (ref 0.61–1.24)
Creatinine, Ser: 2.29 mg/dL — ABNORMAL HIGH (ref 0.61–1.24)
GFR, Estimated: 31 mL/min — ABNORMAL LOW (ref >60)
GFR, Estimated: 34 mL/min — ABNORMAL LOW (ref >60)
Glucose, Bld: 126 mg/dL — ABNORMAL HIGH (ref 70–99)
Glucose, Bld: 203 mg/dL — ABNORMAL HIGH (ref 70–99)
Potassium: 3.6 mmol/L (ref 3.5–5.1)
Potassium: 3.7 mmol/L (ref 3.5–5.1)
Sodium: 137 mmol/L (ref 135–145)
Sodium: 138 mmol/L (ref 135–145)

## 2022-01-06 LAB — CBC
HCT: 42.5 % (ref 39.0–52.0)
Hemoglobin: 14.8 g/dL (ref 13.0–17.0)
MCH: 29.6 pg (ref 26.0–34.0)
MCHC: 34.8 g/dL (ref 30.0–36.0)
MCV: 85 fL (ref 80.0–100.0)
Platelets: 142 10*3/uL — ABNORMAL LOW (ref 150–400)
RBC: 5 MIL/uL (ref 4.22–5.81)
RDW: 11.9 % (ref 11.5–15.5)
WBC: 8 10*3/uL (ref 4.0–10.5)
nRBC: 0 % (ref 0.0–0.2)

## 2022-01-06 LAB — BLOOD GAS, VENOUS
Acid-base deficit: 3.5 mmol/L — ABNORMAL HIGH (ref 0.0–2.0)
Bicarbonate: 22.7 mmol/L (ref 20.0–28.0)
O2 Saturation: 69.5 %
Patient temperature: 37
pCO2, Ven: 44 mmHg (ref 44–60)
pH, Ven: 7.32 (ref 7.25–7.43)
pO2, Ven: 40 mmHg (ref 32–45)

## 2022-01-06 LAB — HIV ANTIBODY (ROUTINE TESTING W REFLEX): HIV Screen 4th Generation wRfx: NONREACTIVE

## 2022-01-06 LAB — GLUCOSE, CAPILLARY
Glucose-Capillary: 251 mg/dL — ABNORMAL HIGH (ref 70–99)
Glucose-Capillary: 120 mg/dL — ABNORMAL HIGH (ref 70–99)
Glucose-Capillary: 235 mg/dL — ABNORMAL HIGH (ref 70–99)

## 2022-01-06 LAB — LACTIC ACID, PLASMA: Lactic Acid, Venous: 1.3 mmol/L (ref 0.5–1.9)

## 2022-01-06 LAB — HEMOGLOBIN A1C
Hgb A1c MFr Bld: 8.6 % — ABNORMAL HIGH (ref 4.8–5.6)
Mean Plasma Glucose: 200.12 mg/dL

## 2022-01-06 LAB — CBG MONITORING, ED: Glucose-Capillary: 116 mg/dL — ABNORMAL HIGH (ref 70–99)

## 2022-01-06 MED ORDER — SODIUM CHLORIDE 0.9 % IV SOLN: 1.0000 g | INTRAVENOUS | Status: DC

## 2022-01-06 MED ORDER — ALUM & MAG HYDROXIDE-SIMETH 200-200-20 MG/5ML PO SUSP: 15.0000 mL | Freq: Four times a day (QID) | ORAL | Status: DC | PRN

## 2022-01-06 MED ORDER — ASPIRIN EC 81 MG PO TBEC
81.0000 mg | DELAYED_RELEASE_TABLET | Freq: Every day | ORAL | Status: DC
  Administered 2022-01-06: 81 mg via ORAL
  Filled 2022-01-06: qty 1

## 2022-01-06 MED ORDER — POTASSIUM CHLORIDE IN NACL 20-0.9 MEQ/L-% IV SOLN
INTRAVENOUS | Status: DC
  Filled 2022-01-06 (×2): qty 1000

## 2022-01-06 MED ORDER — CEFDINIR 300 MG PO CAPS
300.0000 mg | ORAL_CAPSULE | Freq: Two times a day (BID) | ORAL | 0 refills | Status: AC
Start: 2022-01-06 — End: 2022-01-11

## 2022-01-06 MED ORDER — ATORVASTATIN CALCIUM 80 MG PO TABS
80.0000 mg | ORAL_TABLET | Freq: Every day | ORAL | Status: DC
  Administered 2022-01-06: 80 mg via ORAL
  Filled 2022-01-06: qty 1

## 2022-01-06 MED ORDER — TRAZODONE HCL 50 MG PO TABS: 25.0000 mg | ORAL_TABLET | Freq: Every evening | ORAL | Status: DC | PRN

## 2022-01-06 MED ORDER — GLIPIZIDE ER 5 MG PO TB24
5.0000 mg | ORAL_TABLET | Freq: Every day | ORAL | Status: DC
  Administered 2022-01-06: 5 mg via ORAL
  Filled 2022-01-06: qty 1

## 2022-01-06 MED ORDER — ACETAMINOPHEN 325 MG PO TABS: 650.0000 mg | ORAL_TABLET | Freq: Four times a day (QID) | ORAL | Status: DC | PRN

## 2022-01-06 MED ORDER — ENOXAPARIN SODIUM 40 MG/0.4ML IJ SOSY
40.0000 mg | PREFILLED_SYRINGE | INTRAMUSCULAR | Status: DC
  Administered 2022-01-06: 40 mg via SUBCUTANEOUS
  Filled 2022-01-06: qty 0.4

## 2022-01-06 MED ORDER — MAGNESIUM HYDROXIDE 400 MG/5ML PO SUSP: 30.0000 mL | Freq: Every day | ORAL | Status: DC | PRN

## 2022-01-06 MED ORDER — ACETAMINOPHEN 650 MG RE SUPP: 650.0000 mg | Freq: Four times a day (QID) | RECTAL | Status: DC | PRN

## 2022-01-06 MED ORDER — ONDANSETRON HCL 4 MG PO TABS: 4.0000 mg | ORAL_TABLET | Freq: Four times a day (QID) | ORAL | Status: DC | PRN

## 2022-01-06 MED ORDER — ONDANSETRON HCL 4 MG/2ML IJ SOLN: 4.0000 mg | Freq: Four times a day (QID) | INTRAMUSCULAR | Status: DC | PRN

## 2022-01-06 MED ORDER — INSULIN ASPART 100 UNIT/ML IJ SOLN
0.0000 [IU] | Freq: Three times a day (TID) | INTRAMUSCULAR | Status: DC
  Administered 2022-01-06: 11 [IU] via SUBCUTANEOUS
  Administered 2022-01-06: 7 [IU] via SUBCUTANEOUS

## 2022-01-06 MED ORDER — FERROUS SULFATE 325 (65 FE) MG PO TABS
325.0000 mg | ORAL_TABLET | Freq: Every day | ORAL | Status: DC
  Administered 2022-01-06: 325 mg via ORAL
  Filled 2022-01-06: qty 1

## 2022-01-06 MED ORDER — AMLODIPINE BESYLATE 5 MG PO TABS
2.5000 mg | ORAL_TABLET | Freq: Every day | ORAL | Status: DC
  Administered 2022-01-06: 2.5 mg via ORAL
  Filled 2022-01-06: qty 1

## 2022-01-06 MED ORDER — GLIPIZIDE ER 5 MG PO TB24
5.0000 mg | ORAL_TABLET | Freq: Every day | ORAL | 0 refills | Status: DC
Start: 2022-01-06 — End: 2023-08-07

## 2022-01-06 NOTE — Assessment & Plan Note (Addendum)
-   This is gradually resolving. ?- The patient was admitted to a medical telemetry bed. ?- We will continue hydration with IV normal saline. ?- We will place him on highly resistant NovoLog subcutaneously with frequent fingerstick blood glucose measures. ?- We will check his hemoglobin A1c. ?- We will follow his BMP. ?- We will continue Glucotrol XL and place him on diabetic diet. ?

## 2022-01-06 NOTE — Assessment & Plan Note (Signed)
-   We will continue his antihypertensives including Coreg and amlodipine. ?

## 2022-01-06 NOTE — Discharge Summary (Incomplete)
?Physician Discharge Summary ?  ?Patient: John Moyer MRN: [id number] DOB: [birth year]  ?Admit date:     01/05/2022  ?Discharge date: {dischdate:26783}  ?Discharge Physician: Bonnell Public  ? ?PCP: [name], MD  ? ?Recommendations at discharge:  ?{Tip this will not be part of the note when signed- Example include specific recommendations for outpatient follow-up, pending tests to follow-up on. ?(Optional):26781} ? *** ? ?Discharge Diagnoses: ?Principal Problem: ?  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  Acute lower UTI ?  Acute kidney injury superimposed on chronic kidney disease ([place]) ?  Coronary artery disease ?  Dyslipidemia ?  Essential hypertension ? ?Resolved Problems: ?  * No resolved hospital problems. * ? ?Hospital Course: ?No notes on file ? 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 ?- The patient was admitted to a medical telemetry bed. ?- We will continue hydration with IV normal saline. ?- We will place him on highly resistant NovoLog subcutaneously with frequent fingerstick blood glucose measures. ?- We will check his hemoglobin A1c. ?- We will follow his BMP. ?- We will continue Glucotrol XL and place him on diabetic diet. ? ?Acute lower UTI ?- This could be the culprit for his DKA. ?- We will place on IV Rocephin and follow urine culture and sensitivity. ? 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 ?- She will be hydrated with IV normal saline as mentioned above and will follow BMP. ?- We will avoid nephrotoxins. ? ?Essential hypertension ?- We will continue his antihypertensives including Coreg and amlodipine. ? ?Dyslipidemia ?- We will continue statin therapy. ? ?Coronary artery disease ?- We will continue his aspirin, Coreg and statin therapy. ? ? ? ? ? {Tip this will not be part of the note when signed Body mass index is 23.66 kg/m?. ?, ,  (Optional):26781} ? ?{(NOTE) Pain  control PDMP Statment (Optional):26782} ?Consultants: *** ?Procedures performed: ***  ?Disposition: {Plan; Disposition:26390} ?Diet recommendation:  ?Discharge Diet Orders (From admission, onward)  ? ?  Start     Ordered  ? 01/06/22 0000  Diet - low sodium heart healthy       ? 01/06/22 1153  ? 01/06/22 0000  Diet Carb Modified       ? 01/06/22 1153  ? ?  ?  ? ?  ? ?{Diet_Plan:26776} ?DISCHARGE MEDICATION: ?Allergies as of 01/06/2022   ?No Known Allergies ?  ? ?  ?Medication List  ?  ? ?STOP taking these medications   ? ?alum & mag hydroxide-simeth 200-200-20 MG/5ML suspension ?Commonly known as: MAALOX/MYLANTA ?  ?COQ-10 PO ?  ? ?  ? ?TAKE these medications   ? ?amLODipine 2.5 MG tablet ?Commonly known as: NORVASC ?Take 2.5 mg by mouth daily. ?  ?aspirin EC 81 MG tablet ?Take 81 mg by mouth daily. Swallow whole. ?  ?atorvastatin 80 MG tablet ?Commonly known as: LIPITOR ?Take 80 mg by mouth daily. ?  ?carvedilol 3.125 MG tablet ?Commonly known as: COREG ?Take 3.125 mg by mouth 2 (two) times daily with a meal. ?  ?cefdinir 300 MG capsule ?Commonly known as: OMNICEF ?Take 1 capsule (300 mg total) by mouth 2 (two) times daily for 5 days. ?  ?ferrous sulfate 325 (65 FE) MG tablet ?Take 325 mg by mouth daily. ?  ?glipiZIDE 5 MG 24 hr tablet ?Commonly known as: GLUCOTROL XL ?Take 5 mg by mouth daily. ?  ?Civil engineer, contracting  test strip ?Generic drug: glucose blood ?1 each daily. ?  ?Repatha SureClick 660 MG/ML Soaj ?Generic drug: Evolocumab ?Inject 140 mg into the skin every 14 (fourteen) days. ?  ? ?  ? ? ?Discharge Exam: ?Danley Danker Weights  ? 01/05/22 1738  ?Weight: 74.8 kg  ? ?*** ? ?Condition at discharge: {DC Condition:26389} ? ?The results of significant diagnostics from this hospitalization (including imaging, microbiology, ancillary and laboratory) are listed below for reference.  ? ?Imaging Studies: ?DG Chest Portable 1 View ? ?Result Date: 01/05/2022 ?CLINICAL DATA:  sob EXAM: PORTABLE CHEST 1 VIEW COMPARISON:  None.  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 The heart and mediastinal contours are within normal limits. No focal consolidation. No pulmonary edema. No pleural effusion. No pneumothorax. No acute osseous abnormality. IMPRESSION: Left chest wall 2 lead pacemaker with ventricular lead appearing slightly more superior than expected. Recommend correlation with prior chest x-ray or cross-sectional imaging. Electronically Signed   By: Iven Finn M.D.   On: 01/05/2022 19:48   ? ?Microbiology: ?Results for orders placed or performed during the hospital encounter of 01/05/22  ?Resp Panel by RT-PCR (Flu A&B, Covid) Nasopharyngeal Swab     Status: None  ? Collection Time: 01/05/22  7:15 PM  ? Specimen: Nasopharyngeal Swab; Nasopharyngeal(NP) swabs in vial transport medium  ?Result Value Ref Range Status  ? SARS Coronavirus 2 by RT PCR NEGATIVE NEGATIVE Final  ?  Comment: (NOTE) ?SARS-CoV-2 target nucleic acids are NOT DETECTED. ? ?The SARS-CoV-2 RNA is generally detectable in upper respiratory ?specimens during the acute phase of infection. The lowest ?concentration of SARS-CoV-2 viral copies this assay can detect is ?138 copies/mL. A negative result does not preclude SARS-Cov-2 ?infection and should not be used as the sole basis for treatment or ?other patient management decisions. A negative result may occur with  ?improper specimen collection/handling, submission of specimen other ?than nasopharyngeal swab, presence of viral mutation(s) within the ?areas targeted by this assay, and inadequate number of viral ?copies(<138 copies/mL). A negative result must be combined with ?clinical observations, patient history, and epidemiological ?information. The expected result is Negative. ? ?Fact Sheet for Patients:  ?EntrepreneurPulse.com.au ? ?Fact Sheet for Healthcare Providers:  ?IncredibleEmployment.be ? ?This test is no t  yet approved or cleared by the [place] FDA and  ?has been authorized for detection and/or diagnosis of SARS-CoV-2 by ?FDA under an Emergency Use Authorization (EUA). This EUA will remain  ?in effect (meaning this test can be used) for the duration of the ?COVID-19 declaration under Section 564(b)(1) of the Act, 21 ?U.S.C.section 360bbb-3(b)(1), unless the authorization is terminated  ?or revoked sooner.  ? ? ?  ? Influenza A by PCR NEGATIVE NEGATIVE Final  ? Influenza B by PCR NEGATIVE NEGATIVE Final  ?  Comment: (NOTE) ?The Xpert Xpress SARS-CoV-2/FLU/RSV plus assay is intended as an aid ?in the diagnosis of influenza from Nasopharyngeal swab specimens and ?should not be used as a sole basis for treatment. Nasal washings and ?aspirates are unacceptable for Xpert Xpress SARS-CoV-2/FLU/RSV ?testing. ? ?Fact Sheet for Patients: ?EntrepreneurPulse.com.au ? ?Fact Sheet for Healthcare Providers: ?IncredibleEmployment.be ? ?This test is not yet approved or cleared by the [place] FDA and ?has been authorized for detection and/or diagnosis of SARS-CoV-2 by ?FDA under an Emergency Use Authorization (EUA). This EUA will remain ?in effect (meaning this test can be used) for the duration of the ?COVID-19 declaration under Section 564(b)(1) of the Act, 21 U.S.C. ?  section 360bbb-3(b)(1), unless the authorization is terminated or ?revoked. ? ?Performed at [facility], [address], ?[place], [place] [address] ?  ? ? ?Labs: ?CBC: ?Recent Labs  ?Lab 01/05/22 ?1748 01/05/22 ?1920 01/06/22 ?0243  ?WBC 10.8*  --  8.0  ?HGB 15.7 15.0 14.8  ?HCT 46.7 44.0 42.5  ?MCV 84.8  --  85.0  ?PLT 170  --  142*  ? ?Basic Metabolic Panel: ?Recent Labs  ?Lab 01/05/22 ?1748 01/05/22 ?1920 01/05/22 ?2054 01/06/22 ?0015 01/06/22 ?0243  ?NA 129* 137 137 138 137  ?K 5.7* 4.4 3.9 3.6 3.7  ?CL 96*  --  102 105 108  ?CO2 18*  --  23 23 17*  ?GLUCOSE 462*  --  211* 126* 203*  ?BUN 77*  --   76* 68* 60*  ?CREATININE 2.89*  --  2.74* 2.29* 2.13*  ?CALCIUM 9.9  --  10.0 8.9 8.8*  ? ?Liver Function Tests: ?No results for input(s): AST, ALT, ALKPHOS, BILITOT, PROT, ALBUMIN in the last 168 hours. ?CBG:

## 2022-01-06 NOTE — ED Notes (Signed)
[facility] at bedside 

## 2022-01-06 NOTE — Progress Notes (Signed)
Assessed discharge lounge appropriateness and readiness. Spoke with patient's wife to assessed who was coming to pick up the patient for discharge. Wife said she doesn't know when she and one of her children can come. Her children work and are the mode of transportation. She said she can't contact them while they are working. Said she will notify the nursing station when she's able to reach her children.  ?

## 2022-01-06 NOTE — Progress Notes (Signed)
John Moyer to be D/C'd home per MD order. Discussed with the patient and all questions fully answered.  ?Skin clean, dry and intact without evidence of skin break down, no evidence of skin tears noted.  ?IV catheter discontinued intact. Site without signs and symptoms of complications. Dressing and pressure applied.  ?An After Visit Summary was printed and given to the patient.  ?Patient escorted via [place], and D/C home via private auto.  ?[name]  ?01/06/2022  ?  ?   ?

## 2022-01-06 NOTE — Assessment & Plan Note (Signed)
-   We will continue his aspirin, Coreg and statin therapy. ?

## 2022-01-06 NOTE — Progress Notes (Signed)
?  Transition of Care (TOC) Screening Note ? ? ?Patient Details  ?Name: John Moyer ?Date of Birth: 06/03/1956 ? ? ?Transition of Care (TOC) CM/SW Contact:    ?Cyndi Bender, RN ?Phone Number: ?01/06/2022, 9:09 AM ? ? ? ?Transition of Care Department [facility]) has reviewed patient and no TOC needs have been identified at this time. We will continue to monitor patient advancement through interdisciplinary progression rounds. If new patient transition needs arise, please place a TOC consult. ? ? ?

## 2022-01-06 NOTE — TOC Transition Note (Signed)
Transition of Care (TOC) - CM/SW Discharge Note ? ? ?Patient Details  ?Name: John Moyer ?MRN: [id number] ?Date of Birth: 1956/06/25 ? ?Transition of Care (TOC) CM/SW Contact:  ?Cyndi Bender, RN ?Phone Number: ?01/06/2022, 2:35 PM ? ? ?Clinical Narrative:    ?Patient stable for discharge.  ?Patient's PCP prescribed him Trulicity and patient's insurance denied. Patient came to the hospital with mild DKA. MD requested RNCM to make apt for PCP tomorrow. RNCM has notified patient of apt time tomorrow and added to AVS.Wife will pick up patient for discharge.  ? ? ?Final next level of care: Home/Self Care ?Barriers to Discharge: Barriers Resolved ? ? ?Patient Goals and CMS Choice ?Patient states their goals for this hospitalization and ongoing recovery are:: return home ?  ?  ? ?Discharge Placement ?  ?          home ?  ?  ?  ?  ? ?Discharge Plan and Services ?  ?Discharge Planning Services: Follow-up appt scheduled ?           ?  ?  ?  ?  ?  ?  ?  ?  ?  ?  ? ?Social Determinants of Health (SDOH) Interventions ?  ? ? ?Readmission Risk Interventions ? ?  01/06/2022  ?  2:31 PM  ?Readmission Risk Prevention Plan  ?Transportation Screening Complete  ?PCP or Specialist Appt within 5-7 Days Complete  ?Home Care Screening Complete  ?Medication Review (RN CM) Complete  ? ? ? ? ? ?

## 2022-01-06 NOTE — Assessment & Plan Note (Signed)
-   We will continue statin therapy. 

## 2022-01-06 NOTE — Assessment & Plan Note (Signed)
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 ?- She will be hydrated with IV normal saline as mentioned above and will follow BMP. ?- We will avoid nephrotoxins. ?

## 2022-01-06 NOTE — Plan of Care (Signed)

## 2022-01-06 NOTE — Assessment & Plan Note (Signed)
-   This could be the culprit for his DKA. ?- We will place on IV Rocephin and follow urine culture and sensitivity. ?

## 2022-01-06 NOTE — Progress Notes (Signed)
Inpatient Diabetes Program Recommendations ? ?AACE/ADA: New Consensus Statement on Inpatient Glycemic Control (2015) ? ?Target Ranges:  Prepandial:   less than 140 mg/dL ?     Peak postprandial:   less than 180 mg/dL (1-2 hours) ?     Critically ill patients:  140 - 180 mg/dL  ? ?Lab Results  ?Component Value Date  ? GLUCAP 251 (H) 01/06/2022  ? HGBA1C 8.6 (H) 01/06/2022  ? ? ?Review of Glycemic Control ? Latest Reference Range & Units 01/05/22 17:36 01/05/22 20:29 01/05/22 21:03 01/05/22 21:36 01/05/22 22:41 01/06/22 00:05 01/06/22 06:36  ?Glucose-Capillary 70 - 99 mg/dL 525 (HH) 243 (H) 209 (H) 184 (H) 121 (H) 116 (H) 251 (H)  ? ?Diabetes history: DM 2 ?Outpatient Diabetes medications:  ?Glucotrol XL 5 mg daily ?Current orders for Inpatient glycemic control:  ?Novolog resistant tid with meals and HS ? ?Inpatient Diabetes Program Recommendations:   ? ?Will talk to patient.  It appears that he needs basal insulin?   ? ?Thanks,  ?Adah Perl, RN, BC-ADM ?Inpatient Diabetes Coordinator ?Pager [phone]   ? ?1515- Spoke with patient.  He states he was on insulin for a short time and then it was stopped.  He was taking Glipizide but MD stopped it and started samples of Trulicity. He only had 2 sample pens and then was unable to obtain?? Note plans for patient to resume Glipizide.  Patient state he does not have refills for Glipizide at this time.  He states he has meter/strips at home and plans to monitor closely. Reminded him of normal blood sugar values and the importance of f/u with PCP.  Patient states MD is aware of his admit b/c he sent him to the ER.  He may need insulin at home?  I told patient this and reminded him again to notify MD if blood sugars consistently>200 mg/dL.  Patient verbalized understanding.   ? ? ?

## 2022-01-06 NOTE — H&P (Addendum)
?  ?  ?[place] ? ? ?PATIENT NAME: John Moyer   ? ?MR#:  [id number] ? ?DATE OF BIRTH:  May 10, 1956 ? ?DATE OF ADMISSION:  01/05/2022 ? ?PRIMARY CARE PHYSICIAN: [name], MD  ? ?Patient is coming from: Home ? ?REQUESTING/REFERRING PHYSICIAN: Isla Pence, MD ? ?CHIEF COMPLAINT:  ? ?Chief Complaint  ?Patient presents with  ?? Fatigue  ? ? ?HISTORY OF PRESENT ILLNESS:  ?John Moyer is a 66 y.o. Caucasian male with medical history significant for coronary artery disease, type diabetes mellitus, CVA and pacemaker placement, presented to the ER with acute onset of generalized fatigue and tiredness for about a week.  The patient stated that his PCP changes diabetic medication to Trulicity and his insurance denied coverage but he never complicated that to his physician.  As a result he has not been on any diabetic medications for 4 weeks.  He was seen yesterday by his PCP and his blood glucose was reading high and his systolic BP was in the 78G.  [facility].  He was found to have hyperglycemia with associated mild DKA.  He admitted to recent polyuria and polydipsia.  He denies any nausea or vomiting or abdominal pain.  No dysuria, oliguria, urinary urgency or flank pain.  No chest pain or dyspnea or cough or wheezing. ? ?ED Course: Upon presentation to the emergency room his BP was 89/68 with otherwise normal vital signs.  Later on with hydration BP was up to  121/89.  Labs revealed a VBG with pH 7.3 with HCO3 of 24.8 but earlier his BMP showed anion gap of 15 with CO2 of 18 and sodium of 129 with a potassium of 5.7, blood glucose of 462 with a BUN of 77 and creatinine 2.89.  CBC showed leukocytosis of 10.8 and beta hydroxybutyrate was 0.83.  Influenza antigens and COVID-19 PCR came back negative.  Repeat sodium later on was 137 after hydration and potassium 4.4.  Around midnight his sodium came back 138 with CO2 of 23 and anion gap of 10.  Lactic acid was 2.3  and later 1.3.  UA was positive for UTI. ?EKG as reviewed by me : Showed paced rhythm with a rate of 76 . ? ?Imaging: Portable chest ray showed left chest wall 2-lead pacemaker with ventricular lead appearing slightly more superior than expected with recommendation for correlation with prior chest x-ray. ? ?The patient was given 8 units of IV NovoLog, IV lactated ringer bolus.  DKA protocol, IV Rocephin for his UTI.  He is directly admitted to medical telemetry bed for further evaluation and management.   ?PAST MEDICAL HISTORY:  ? ?Past Medical History:  ?Diagnosis Date  ?? CAD (coronary artery disease)   ?? 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 Pacemaker Peridot MRI Dual chamber pacemaker 07/24/2021  ?? Stroke [facility])   ?? Type 2 diabetes mellitus ([place])   ? ? ?PAST SURGICAL HISTORY:  ? ?Past Surgical History:  ?Procedure Laterality Date  ?? COLONOSCOPY WITH PROPOFOL N/A 11/26/2021  ? Procedure: COLONOSCOPY WITH PROPOFOL;  Surgeon: Carol Ada, MD;  Location: [facility] ENDOSCOPY;  Service: Endoscopy;  Laterality: N/A;  ?? CORONARY ARTERY BYPASS GRAFT    ?? POLYPECTOMY  11/26/2021  ? Procedure: POLYPECTOMY;  Surgeon: Carol Ada, MD;  Location: [facility];  Service: Endoscopy;;  ? ? ?SOCIAL HISTORY:  ? ?Social History  ? ?Tobacco Use  ?? Smoking status: Former  ?  Packs/day: 1.00  ?  Years: 50.00  ?  Pack years: 50.00  ?  Types: Cigarettes  ?  Quit date: 2001  ?  Years since quitting: 22.2  ?? Smokeless tobacco: Never  ?Substance Use Topics  ?? Alcohol use: Not Currently  ? ? ?FAMILY HISTORY:  ? He has a brother who died of complications of alcoholism.  His parents died of old age. ?DRUG ALLERGIES:  ?No Known Allergies ? ?REVIEW OF SYSTEMS:  ? ?ROS ?As per history of present illness. All pertinent systems were reviewed above. Constitutional, HEENT, cardiovascular, respiratory, GI, GU, musculoskeletal, neuro, psychiatric, endocrine, integumentary and hematologic systems were reviewed and  are otherwise negative/unremarkable except for positive findings mentioned above in the HPI. ? ? ?MEDICATIONS AT HOME:  ? ?Prior to Admission medications   ?Medication Sig Start Date End Date Taking? Authorizing Provider  ?alum & mag hydroxide-simeth (MAALOX/MYLANTA) 200-200-20 MG/5ML suspension Take 15 mLs by mouth every 6 (six) hours as needed for indigestion or heartburn.    [provider]  ?amLODipine (NORVASC) 2.5 MG tablet Take 2.5 mg by mouth daily.    [provider]  ?aspirin EC 81 MG tablet Take 81 mg by mouth daily. Swallow whole.    [provider]  ?atorvastatin (LIPITOR) 80 MG tablet Take 80 mg by mouth daily.    [provider]  ?carvedilol (COREG) 3.125 MG tablet Take 3.125 mg by mouth 2 (two) times daily with a meal.    [provider]  ?Coenzyme Q10 (COQ-10 PO) Take 1 tablet by mouth daily.    [provider]  ?Evolocumab (REPATHA SURECLICK) 284 MG/ML SOAJ Inject 140 mg into the skin every 14 (fourteen) days. 11/15/21   Patwardhan, Reynold Bowen, MD  ?ferrous sulfate 325 (65 FE) MG tablet Take 325 mg by mouth daily.    [provider]  ?glipiZIDE (GLUCOTROL XL) 5 MG 24 hr tablet Take 5 mg by mouth daily.    [provider]  ?Roma Schanz test strip 1 each daily. 08/26/21   [provider]  ? ?  ? ?VITAL SIGNS:  ?Blood pressure (!) 175/77, pulse 72, temperature 98.2 ?F (36.8 ?C), temperature source Oral, resp. rate 14, height '5\' 10"'$  (1.778 m), weight 74.8 kg, SpO2 97 %. ? ?PHYSICAL EXAMINATION:  ?Physical Exam ? ?GENERAL:  66 y.o.-year-old Caucasian male patient lying in the bed with no acute distress.  ?EYES: Pupils equal, round, reactive to light and accommodation. No scleral icterus. Extraocular muscles intact.  ?HEENT: Head atraumatic, normocephalic. Oropharynx with slightly dry mucous membrane and tongue and nasopharynx clear.  ?NECK:  Supple, no jugular venous distention. No thyroid enlargement, no tenderness.   ?LUNGS: Normal breath sounds bilaterally, no wheezing, rales,rhonchi or crepitation. No use of accessory muscles of respiration.  ?CARDIOVASCULAR: Regular rate and rhythm, S1, S2 normal. No murmurs, rubs, or gallops.  ?ABDOMEN: Soft, nondistended, nontender. Bowel sounds present. No organomegaly or mass.  ?EXTREMITIES: No pedal edema, cyanosis, or clubbing.  ?NEUROLOGIC: Cranial nerves II through XII are intact. Muscle strength 5/5 in all extremities. Sensation intact. Gait not checked.  ?PSYCHIATRIC: The patient is alert and oriented x 3.  Normal affect and good eye contact. ?SKIN: No obvious rash, lesion, or ulcer.  ? ?LABORATORY PANEL:  ? ?CBC ?Recent Labs  ?Lab 01/05/22 ?1748 01/05/22 ?1920  ?WBC 10.8*  --   ?HGB 15.7 15.0  ?HCT 46.7 44.0  ?PLT 170  --   ? ?------------------------------------------------------------------------------------------------------------------ ? ?Chemistries  ?Recent Labs  ?Lab 01/06/22 ?0015  ?  NA 138  ?K 3.6  ?CL 105  ?CO2 23  ?GLUCOSE 126*  ?BUN 68*  ?CREATININE 2.29*  ?CALCIUM 8.9  ? ?------------------------------------------------------------------------------------------------------------------ ? ?Cardiac Enzymes ?No results for input(s): TROPONINI in the last 168 hours. ?------------------------------------------------------------------------------------------------------------------ ? ?RADIOLOGY:  ?DG Chest Portable 1 View ? ?Result Date: 01/05/2022 ?CLINICAL DATA:  sob EXAM: PORTABLE CHEST 1 VIEW COMPARISON:  None. 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 The heart and mediastinal contours are within normal limits. No focal consolidation. No pulmonary edema. No pleural effusion. No pneumothorax. No acute osseous abnormality. IMPRESSION: Left chest wall 2 lead pacemaker with ventricular lead appearing slightly more superior than expected. Recommend correlation with prior chest x-ray or cross-sectional  imaging. Electronically Signed   By: Iven Finn M.D.   On: 01/05/2022 19:48   ? ? ? ?IMPRESSION AND PLAN:  ?Assessment and Plan: ?* DKA, type 2 ([place]) ?- This is gradually resolving. ?- The patient was admit

## 2022-01-08 ENCOUNTER — Encounter: Payer: Self-pay | Admitting: Cardiology

## 2022-01-08 LAB — URINE CULTURE: Culture: >=100000 — AB

## 2022-01-11 LAB — CULTURE, BLOOD (ROUTINE X 2)
Culture: NO GROWTH
Culture: NO GROWTH
Special Requests: ADEQUATE
Special Requests: ADEQUATE

## 2022-01-16 ENCOUNTER — Emergency Department (HOSPITAL_COMMUNITY)
Admission: EM | Admit: 2022-01-16 | Discharge: 2022-01-17 | Disposition: A | Discharge status: Home / Self Care | Payer: 59 | Attending: Emergency Medicine | Admitting: Emergency Medicine

## 2022-01-16 ENCOUNTER — Encounter (HOSPITAL_COMMUNITY): Payer: Self-pay

## 2022-01-16 DIAGNOSIS — R55 Syncope and collapse: Secondary | ICD-10-CM | POA: Insufficient documentation

## 2022-01-16 DIAGNOSIS — R0689 Other abnormalities of breathing: Secondary | ICD-10-CM | POA: Insufficient documentation

## 2022-01-16 DIAGNOSIS — R748 Abnormal levels of other serum enzymes: Secondary | ICD-10-CM

## 2022-01-16 DIAGNOSIS — R531 Weakness: Secondary | ICD-10-CM | POA: Diagnosis not present

## 2022-01-16 DIAGNOSIS — R5383 Other fatigue: Secondary | ICD-10-CM | POA: Insufficient documentation

## 2022-01-16 DIAGNOSIS — R7401 Elevation of levels of liver transaminase levels: Secondary | ICD-10-CM | POA: Diagnosis not present

## 2022-01-16 DIAGNOSIS — R112 Nausea with vomiting, unspecified: Secondary | ICD-10-CM | POA: Diagnosis present

## 2022-01-16 DIAGNOSIS — Z7982 Long term (current) use of aspirin: Secondary | ICD-10-CM | POA: Insufficient documentation

## 2022-01-16 DIAGNOSIS — N289 Disorder of kidney and ureter, unspecified: Secondary | ICD-10-CM | POA: Diagnosis not present

## 2022-01-16 LAB — COMPREHENSIVE METABOLIC PANEL
ALT: 242 U/L — ABNORMAL HIGH (ref 0–44)
AST: 134 U/L — ABNORMAL HIGH (ref 15–41)
Albumin: 4.1 g/dL (ref 3.5–5.0)
Alkaline Phosphatase: 156 U/L — ABNORMAL HIGH (ref 38–126)
Anion gap: 15 (ref 5–15)
BUN: 54 mg/dL — ABNORMAL HIGH (ref 8–23)
CO2: 23 mmol/L (ref 22–32)
Calcium: 9.8 mg/dL (ref 8.9–10.3)
Chloride: 99 mmol/L (ref 98–111)
Creatinine, Ser: 2.56 mg/dL — ABNORMAL HIGH (ref 0.61–1.24)
GFR, Estimated: 27 mL/min — ABNORMAL LOW (ref >60)
Glucose, Bld: 194 mg/dL — ABNORMAL HIGH (ref 70–99)
Potassium: 4.3 mmol/L (ref 3.5–5.1)
Sodium: 137 mmol/L (ref 135–145)
Total Bilirubin: 1.1 mg/dL (ref 0.3–1.2)
Total Protein: 8.4 g/dL — ABNORMAL HIGH (ref 6.5–8.1)

## 2022-01-16 LAB — CBC WITH DIFFERENTIAL/PLATELET
Abs Immature Granulocytes: 0.03 10*3/uL (ref 0.00–0.07)
Basophils Absolute: 0.1 10*3/uL (ref 0.0–0.1)
Basophils Relative: 1 %
Eosinophils Absolute: 0.1 10*3/uL (ref 0.0–0.5)
Eosinophils Relative: 1 %
HCT: 48.2 % (ref 39.0–52.0)
Hemoglobin: 15.9 g/dL (ref 13.0–17.0)
Immature Granulocytes: 0 %
Lymphocytes Relative: 17 %
Lymphs Abs: 1.7 10*3/uL (ref 0.7–4.0)
MCH: 28.8 pg (ref 26.0–34.0)
MCHC: 33 g/dL (ref 30.0–36.0)
MCV: 87.3 fL (ref 80.0–100.0)
Monocytes Absolute: 0.6 10*3/uL (ref 0.1–1.0)
Monocytes Relative: 6 %
Neutro Abs: 7.5 10*3/uL (ref 1.7–7.7)
Neutrophils Relative %: 75 %
Platelets: 188 10*3/uL (ref 150–400)
RBC: 5.52 MIL/uL (ref 4.22–5.81)
RDW: 12.3 % (ref 11.5–15.5)
WBC: 10 10*3/uL (ref 4.0–10.5)
nRBC: 0 % (ref 0.0–0.2)

## 2022-01-16 LAB — TROPONIN I (HIGH SENSITIVITY)
Troponin I (High Sensitivity): 20 ng/L — ABNORMAL HIGH (ref <18)
Troponin I (High Sensitivity): 23 ng/L — ABNORMAL HIGH (ref <18)

## 2022-01-16 LAB — BRAIN NATRIURETIC PEPTIDE: B Natriuretic Peptide: 83.9 pg/mL (ref 0.0–100.0)

## 2022-01-16 NOTE — Subjective & Objective (Signed)
Generalized fatigue and weakness for the past 3 days with nausea and vomiting last week he was hospitalized for DKA and UTI.  Today he felt so lightheaded he had a syncopal episode about an hour prior to getting to ER.  There was no fall he did not hit his head his family was able to catch him in time. ?

## 2022-01-16 NOTE — ED Provider Notes (Signed)
Care assumed from Dr. Almyra Free, patient with weakness, borderline initial troponin pending delta troponin and urinalysis.  If negative, can be discharged. ? ?Repeat troponin did not show any significant rise.  Urinalysis shows no evidence of infection.  He is felt to be safe for discharge.  He will need to follow-up with PCP regarding elevated liver enzymes.  Given prescription for ondansetron for nausea.  Return precautions discussed. ?  ?Delora Fuel, MD ?13/24/40 1027 ? ?

## 2022-01-16 NOTE — ED Notes (Signed)
Pacemaker has been interrogated.  ?

## 2022-01-16 NOTE — H&P (Incomplete)
? ? John Moyer MPN:[id number] DOB: 1956/05/14 DOA: 01/16/2022 ? ? ?  ?PCP: [name], MD   ?Outpatient Specialists:  ?CARDS:  Dr.Patwardhan ?NEphrology: *  Dr. ?NEurology *   Dr. ?Pulmonary *  Dr. ? Oncology * Dr. ?GI  Dr. Benson Norway ?Urology Dr. Marland Kitchen ? ?Patient arrived to ER on 01/16/22 at [facility] ?Referred by Attending Luna Fuse, MD ? ? ?Patient coming from:   ? home Lives  With family ?  ? ?Chief Complaint:   ?Chief Complaint  ?Patient presents with  ? Weakness  ? ? ?HPI: ?John Moyer is a 66 y.o. male with medical history significant of DM2, CKD, HTN , HLD , CAD ?  ? ?Presented with  syncope ?Generalized fatigue and weakness for the past 3 days with nausea and vomiting last week he was hospitalized for DKA and UTI.  Today he felt so lightheaded he had a syncopal episode about an hour prior to getting to ER.  There was no fall he did not hit his head his family was able to catch him in time. ?  ? ? Initial COVID TEST  ?NEGATIVE**** POSITIVE,  ?***in house  PCR testing  Pending ? ?Lab Results  ?Component Value Date  ? [place] NEGATIVE 01/05/2022  ? ?  ?Regarding pertinent Chronic problems:   ? ? Hyperlipidemia -  on statins lipitor ?Lipid Panel  ?No results found for: CHOL, TRIG, HDL, CHOLHDL, VLDL, LDLCALC, LDLDIRECT, LABVLDL ? ? HTN on NOrvasc, coreg ?  ? ?  CAD  - On Aspirin, statin, betablocker,  ?               -  followed by cardiology ?              Status post CABG ? ?  DM 2 -  ?Lab Results  ?Component Value Date  ? HGBA1C 8.6 (H) 01/06/2022  ? on  PO meds only,   ?  ? CKD stage IIIb- baseline Cr **** ?Estimated Creatinine Clearance: 30.5 mL/min (A) (by C-G formula based on SCr of 2.56 mg/dL (H)). ? ?Lab Results  ?Component Value Date  ? CREATININE 2.56 (H) 01/16/2022  ? CREATININE 2.13 (H) 01/06/2022  ? CREATININE 2.29 (H) 01/06/2022  ? ?  ?  ? ?While in ER: ?  ? ?Pacemaker interrogated and no events noted  ? ? ?Ordered ? ?CT HEAD *** NON acute ? ?CXR - ***NON acute ? ?CTabd/pelvis -  ***nonacute ? ?CTA chest - ***nonacute, no PE, * no evidence of infiltrate ? ?Following Medications were ordered in ER: ?Medications - No data to display  ?  ?  ?ED Triage Vitals [01/16/22 1845]  ?Enc Vitals Group  ?   BP (!) 163/94  ?   Pulse Rate 83  ?   Resp 14  ?   Temp 98.1 ?F (36.7 ?C)  ?   Temp src   ?   SpO2 100 %  ?   Weight   ?   Height   ?   Head Circumference   ?   Peak Flow   ?   Pain Score   ?   Pain Loc   ?   Pain Edu?   ?   Excl. in [place]?   ?MGQQ(76)@    ? _________________________________________ ?Significant initial  Findings: ?Abnormal Labs Reviewed  ?COMPREHENSIVE METABOLIC PANEL - Abnormal; Notable for the following components:  ?    Result Value  ? Glucose, Bld 194 (*)   ?  BUN 54 (*)   ? Creatinine, Ser 2.56 (*)   ? Total Protein 8.4 (*)   ? AST 134 (*)   ? ALT 242 (*)   ? Alkaline Phosphatase 156 (*)   ? GFR, Estimated 27 (*)   ? All other components within normal limits  ?TROPONIN I (HIGH SENSITIVITY) - Abnormal; Notable for the following components:  ? Troponin I (High Sensitivity) 20 (*)   ? All other components within normal limits  ? ?  ?_________________________ ?Troponin 20  ?ECG: Ordered ?Personally reviewed by me showing: ?HR : 84 ?Rhythm:  Paced ?  ? ?  ? ?The recent clinical data is shown below. ?Vitals:  ? 01/16/22 1930 01/16/22 1945 01/16/22 2130 01/16/22 2200  ?BP: 130/89 118/74 (!) 142/84 139/89  ?Pulse:   82 79  ?Resp: '10 17 15 10  '$ ?Temp:      ?SpO2:   98% 96%  ? ?  ?WBC ? ?   ?Component Value Date/Time  ? WBC 10.0 01/16/2022 2005  ? LYMPHSABS 1.7 01/16/2022 2005  ? MONOABS 0.6 01/16/2022 2005  ? EOSABS 0.1 01/16/2022 2005  ? BASOSABS 0.1 01/16/2022 2005  ? ?  ? ? UA *** no evidence of UTI  ***Pending ***not ordered ?  ?Urine analysis: ?   ?Component Value Date/Time  ? [place] YELLOW 01/05/2022 1955  ? APPEARANCEUR HAZY (A) 01/05/2022 1955  ? LABSPEC 1.025 01/05/2022 1955  ? PHURINE 5.0 01/05/2022 1955  ? GLUCOSEU >1,000 (A) 01/05/2022 1955  ? HGBUR SMALL (A) 01/05/2022 1955   ? [place] NEGATIVE 01/05/2022 1955  ? [place] Stain NEGATIVE 01/05/2022 1955  ? PROTEINUR TRACE (A) 01/05/2022 1955  ? NITRITE NEGATIVE 01/05/2022 1955  ? LEUKOCYTESUR LARGE (A) 01/05/2022 1955  ? ? ?Results for orders placed or performed during the hospital encounter of 01/05/22  ?Resp Panel by RT-PCR (Flu A&B, Covid) Nasopharyngeal Swab     Status: None  ? Collection Time: 01/05/22  7:15 PM  ? Specimen: Nasopharyngeal Swab; Nasopharyngeal(NP) swabs in vial transport medium  ?Result Value Ref Range Status  ? SARS Coronavirus 2 by RT PCR NEGATIVE NEGATIVE Final  ?  Comment: (NOTE) ?SARS-CoV-2 target nucleic acids are NOT DETECTED. ? ?The SARS-CoV-2 RNA is generally detectable in upper respiratory ?specimens during the acute phase of infection. The lowest ?concentration of SARS-CoV-2 viral copies this assay can detect is ?138 copies/mL. A negative result does not preclude SARS-Cov-2 ?infection and should not be used as the sole basis for treatment or ?other patient management decisions. A negative result may occur with  ?improper specimen collection/handling, submission of specimen other ?than nasopharyngeal swab, presence of viral mutation(s) within the ?areas targeted by this assay, and inadequate number of viral ?copies(<138 copies/mL). A negative result must be combined with ?clinical observations, patient history, and epidemiological ?information. The expected result is Negative. ? ?Fact Sheet for Patients:  ?EntrepreneurPulse.com.au ? ?Fact Sheet for Healthcare Providers:  ?IncredibleEmployment.be ? ?This test is no t yet approved or cleared by the [place] FDA and  ?has been authorized for detection and/or diagnosis of SARS-CoV-2 by ?FDA under an Emergency Use Authorization (EUA). This EUA will remain  ?in effect (meaning this test can be used) for the duration of the ?COVID-19 declaration under Section 564(b)(1) of the Act, 21 ?U.S.C.section 360bbb-3(b)(1), unless the  authorization is terminated  ?or revoked sooner.  ? ? ?  ? Influenza A by PCR NEGATIVE NEGATIVE Final  ? Influenza B by PCR NEGATIVE NEGATIVE Final  ?  Comment: (NOTE) ?  The Xpert Xpress SARS-CoV-2/FLU/RSV plus assay is intended as an aid ?in the diagnosis of influenza from Nasopharyngeal swab specimens and ?should not be used as a sole basis for treatment. Nasal washings and ?aspirates are unacceptable for Xpert Xpress SARS-CoV-2/FLU/RSV ?testing. ? ?Fact Sheet for Patients: ?EntrepreneurPulse.com.au ? ?Fact Sheet for Healthcare Providers: ?IncredibleEmployment.be ? ?This test is not yet approved or cleared by the [place] FDA and ?has been authorized for detection and/or diagnosis of SARS-CoV-2 by ?FDA under an Emergency Use Authorization (EUA). This EUA will remain ?in effect (meaning this test can be used) for the duration of the ?COVID-19 declaration under Section 564(b)(1) of the Act, 21 U.S.C. ?section 360bbb-3(b)(1), unless the authorization is terminated or ?revoked. ? ?Performed at [facility], [address] ?[place], [place] [address] ?  ?Urine Culture     Status: Abnormal  ? Collection Time: 01/05/22  7:55 PM  ? Specimen: Urine, Clean Catch  ?Result Value Ref Range Status  ? Specimen Description   Final  ?  URINE, CLEAN CATCH ?Performed at [facility], [address], [place], [place] [address] ?  ? Special Requests   Final  ?  NONE ?Performed at [facility], [address] [place], [place] [address] ?  ? Culture >=100,000 COLONIES/mL SERRATIA MARCESCENS (A)  Final  ? Report Status 01/08/2022 FINAL  Final  ? Organism ID, Bacteria SERRATIA MARCESCENS (A)  Final  ?    Susceptibility  ? Serratia marcescens - MIC*  ?  CEFAZOLIN >=64 RESISTANT Resistant   ?  CEFEPIME <=0.12 SENSITIVE Sensitive   ?  CEFTRIAXONE <=0.25 SENSITIVE Sensitive   ?  CIPROFLOXACIN <=0.25 SENSITIVE Sensitive   ?  GENTAMICIN <=1  SENSITIVE Sensitive   ?  NITROFURANTOIN 128 RESISTANT Resistant   ?  TRIMETH/SULFA <=20 SENSITIVE Sensitive   ?  * >=100,000 COLONIES/mL SERRATIA MARCESCENS  ?Culture, blood (routine x 2)     Status: None  ? Collection Ti

## 2022-01-16 NOTE — ED Triage Notes (Signed)
Pt BIB [facility] from home d/t n/v/weakness for the last 3 days. 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 A.Ox4.  ?

## 2022-01-16 NOTE — ED Provider Notes (Addendum)
?[facility] ?Provider Note ? ? ?CSN: [id number] ?Arrival date & time: 01/16/22  1839 ? ?  ? ?History ? ?Chief Complaint  ?Patient presents with  ? Weakness  ? ? ?John Moyer is a 66 y.o. male. ? ?Patient presents to ER, chief complaint of generalized fatigue and weakness for 3 days associate with episodes of vomiting.  He states he was hospitalized approximately a week ago for urinary tract infection and DKA.  He also states that he had a syncopal episode earlier today and 1 about an hour prior to arrival.  Family states that he is other family member caught the patient, there was no fall or trauma.  At this time he has no pain but complaining of generalized fatigue. ? ? ?  ? ?Home Medications ?Prior to Admission medications   ?Medication Sig Start Date End Date Taking? Authorizing Provider  ?amLODipine (NORVASC) 2.5 MG tablet Take 2.5 mg by mouth daily.    [provider]  ?aspirin EC 81 MG tablet Take 81 mg by mouth daily. Swallow whole.    [provider]  ?atorvastatin (LIPITOR) 80 MG tablet Take 80 mg by mouth daily.    [provider]  ?carvedilol (COREG) 3.125 MG tablet Take 3.125 mg by mouth 2 (two) times daily with a meal.    [provider]  ?Evolocumab (REPATHA SURECLICK) 562 MG/ML SOAJ Inject 140 mg into the skin every 14 (fourteen) days. 11/15/21   Patwardhan, Reynold Bowen, MD  ?ferrous sulfate 325 (65 FE) MG tablet Take 325 mg by mouth daily.    [provider]  ?glipiZIDE (GLUCOTROL XL) 5 MG 24 hr tablet Take 1 tablet (5 mg total) by mouth daily. 01/06/22 02/05/22  Bonnell Public, MD  ?Roma Schanz test strip 1 each daily. 08/26/21   [provider]  ?   ? ?Allergies    ?Patient has no known allergies.   ? ?Review of Systems   ?Review of Systems  ?Constitutional:  Negative for fever.  ?HENT:  Negative for ear pain and sore throat.   ?Eyes:  Negative for pain.  ?Respiratory:  Negative for cough.    ?Cardiovascular:  Negative for chest pain.  ?Gastrointestinal:  Negative for abdominal pain.  ?Genitourinary:  Negative for flank pain.  ?Musculoskeletal:  Negative for back pain.  ?Skin:  Negative for color change and rash.  ?Neurological:  Positive for syncope.  ?All other systems reviewed and are negative. ? ?Physical Exam ?Updated Vital Signs ?BP 137/83   Pulse 83   Temp 98.1 ?F (36.7 ?C)   Resp 14   SpO2 96%  ?Physical Exam ?Constitutional:   ?   Appearance: He is well-developed.  ?HENT:  ?   Head: Normocephalic.  ?   Nose: Nose normal.  ?Eyes:  ?   Extraocular Movements: Extraocular movements intact.  ?Cardiovascular:  ?   Rate and Rhythm: Normal rate.  ?Pulmonary:  ?   Effort: Pulmonary effort is normal.  ?Abdominal:  ?   Tenderness: There is no abdominal tenderness. There is no guarding or rebound.  ?Skin: ?   Coloration: Skin is not jaundiced.  ?Neurological:  ?   General: No focal deficit present.  ?   Mental Status: He is alert and oriented to person, place, and time. Mental status is at baseline.  ?   Cranial Nerves: No cranial nerve deficit.  ?   Motor: No weakness.  ?   Gait: Gait normal.  ? ? ?ED  Results / Procedures / Treatments   ?Labs ?(all labs ordered are listed, but only abnormal results are displayed) ?Labs Reviewed  ?COMPREHENSIVE METABOLIC PANEL - Abnormal; Notable for the following components:  ?    Result Value  ? Glucose, Bld 194 (*)   ? BUN 54 (*)   ? Creatinine, Ser 2.56 (*)   ? Total Protein 8.4 (*)   ? AST 134 (*)   ? ALT 242 (*)   ? Alkaline Phosphatase 156 (*)   ? GFR, Estimated 27 (*)   ? All other components within normal limits  ?TROPONIN I (HIGH SENSITIVITY) - Abnormal; Notable for the following components:  ? Troponin I (High Sensitivity) 20 (*)   ? All other components within normal limits  ?CBC WITH DIFFERENTIAL/PLATELET  ?BRAIN NATRIURETIC PEPTIDE  ?URINALYSIS, ROUTINE W REFLEX MICROSCOPIC  ?TROPONIN I (HIGH SENSITIVITY)  ? ? ?EKG ?EKG  Interpretation ? ?Date/Time:  Sunday January 16 2022 18:46:13 EDT ?Ventricular Rate:  84 ?PR Interval:  200 ?QRS Duration: 173 ?QT Interval:  449 ?QTC Calculation: 531 ?R Axis:   97 ?Text Interpretation: Sinus or ectopic atrial rhythm Consider left ventricular hypertrophy Repol abnrm suggests ischemia, diffuse leads Prolonged QT interval Confirmed by Thamas Jaegers ([id number]) on 01/16/2022 9:01:58 PM ? ?Radiology ?No results found. ? ?Procedures ?Procedures  ? ? ?Medications Ordered in ED ?Medications - No data to display ? ?ED Course/ Medical Decision Making/ A&P ?  ?                        ?Medical Decision Making ?Amount and/or Complexity of Data Reviewed ?Labs: ordered. ? ? ?Chart review shows office visit with podiatry December 08, 2021.  He was admitted to the hospital January 05, 2022. ? ?Cardiac monitor shows sinus rhythm. ? ?Diagnosis that includes labs White count 10 hemoglobin 15 chemistry shows persistent renal insufficiency and elevated liver enzymes anion gap is 15 bicarb 23 glucose of 194.  Does not appear in DKA at this time. ? ?Given his concern of syncope with a pacemaker, pacemaker interrogation requested.  No arrhythmias noted.  Pacing appears normal. ? ?Labs show elevated liver enzymes significance of this is unclear as the patient has no abdominal pain no guarding or rebound.  Will recommend outpatient follow-up with gastroenterology within the week.  Phone number provided for the patient to call and follow-up with. ? ?Vital signs remained stable.  Patient is awake and alert has no complaints of pain. ? ?I discussed the patient's findings with his family.  They were some concern that the patient has been more withdrawn and not eating as much at home.  No active thoughts of self-harm recorded however.  Patient denies any thoughts of SI or any auditory or hallucinations.  Recommending outpatient follow-up with his doctor this week regarding additional concerns.  Recommend immediate return if symptoms worsen  or if he has thoughts of self-harm. ? ? ? ?Final Clinical Impression(s) / ED Diagnoses ?Final diagnoses:  ?Syncope, unspecified syncope type  ?Nausea and vomiting, unspecified vomiting type  ?Elevated liver enzymes  ? ? ?Rx / DC Orders ?ED Discharge Orders   ? ? None  ? ?  ? ? ?  ?Luna Fuse, MD ?01/16/22 2231 ? ?  ?Luna Fuse, MD ?01/16/22 2317 ? ?

## 2022-01-17 LAB — URINALYSIS, ROUTINE W REFLEX MICROSCOPIC
Bilirubin Urine: NEGATIVE
Glucose, UA: >=500 mg/dL — AB
Hgb urine dipstick: NEGATIVE
Ketones, ur: 20 mg/dL — AB
Leukocytes,Ua: NEGATIVE
Nitrite: NEGATIVE
Protein, ur: 30 mg/dL — AB
Specific Gravity, Urine: 1.014 (ref 1.005–1.030)
pH: 5 (ref 5.0–8.0)

## 2022-01-17 MED ORDER — ONDANSETRON HCL 4 MG PO TABS: 4.0000 mg | ORAL_TABLET | Freq: Four times a day (QID) | ORAL | 0 refills | Status: DC | PRN

## 2022-01-17 NOTE — Discharge Instructions (Addendum)
Your blood tests did show some elevated liver enzymes.  Please follow-up with your primary care provider to investigate that further. ? ?Return to the emergency department if your symptoms are getting worse. ? ?

## 2022-01-18 ENCOUNTER — Other Ambulatory Visit: Payer: Self-pay

## 2022-01-18 ENCOUNTER — Emergency Department (HOSPITAL_COMMUNITY): Payer: 59

## 2022-01-18 ENCOUNTER — Emergency Department (HOSPITAL_COMMUNITY)
Admission: EM | Admit: 2022-01-18 | Discharge: 2022-01-18 | Disposition: A | Discharge status: Home / Self Care | Payer: 59 | Attending: Emergency Medicine | Admitting: Emergency Medicine

## 2022-01-18 DIAGNOSIS — R7401 Elevation of levels of liver transaminase levels: Secondary | ICD-10-CM | POA: Insufficient documentation

## 2022-01-18 DIAGNOSIS — Z20822 Contact with and (suspected) exposure to covid-19: Secondary | ICD-10-CM | POA: Diagnosis not present

## 2022-01-18 DIAGNOSIS — Z7982 Long term (current) use of aspirin: Secondary | ICD-10-CM | POA: Insufficient documentation

## 2022-01-18 DIAGNOSIS — E860 Dehydration: Secondary | ICD-10-CM | POA: Insufficient documentation

## 2022-01-18 DIAGNOSIS — R519 Headache, unspecified: Secondary | ICD-10-CM | POA: Diagnosis not present

## 2022-01-18 DIAGNOSIS — R42 Dizziness and giddiness: Secondary | ICD-10-CM | POA: Diagnosis present

## 2022-01-18 DIAGNOSIS — Z79899 Other long term (current) drug therapy: Secondary | ICD-10-CM | POA: Insufficient documentation

## 2022-01-18 LAB — URINALYSIS, ROUTINE W REFLEX MICROSCOPIC
Bilirubin Urine: NEGATIVE
Glucose, UA: >=500 mg/dL — AB
Hgb urine dipstick: NEGATIVE
Ketones, ur: 5 mg/dL — AB
Leukocytes,Ua: NEGATIVE
Nitrite: NEGATIVE
Protein, ur: NEGATIVE mg/dL
Specific Gravity, Urine: 1.014 (ref 1.005–1.030)
pH: 5 (ref 5.0–8.0)

## 2022-01-18 LAB — RESP PANEL BY RT-PCR (FLU A&B, COVID) ARPGX2
Influenza A by PCR: NEGATIVE
Influenza B by PCR: NEGATIVE
SARS Coronavirus 2 by RT PCR: NEGATIVE

## 2022-01-18 LAB — HEPATIC FUNCTION PANEL
ALT: 187 U/L — ABNORMAL HIGH (ref 0–44)
AST: 160 U/L — ABNORMAL HIGH (ref 15–41)
Albumin: 3.9 g/dL (ref 3.5–5.0)
Alkaline Phosphatase: 151 U/L — ABNORMAL HIGH (ref 38–126)
Bilirubin, Direct: 0.2 mg/dL (ref 0.0–0.2)
Indirect Bilirubin: 0.8 mg/dL (ref 0.3–0.9)
Total Bilirubin: 1 mg/dL (ref 0.3–1.2)
Total Protein: 8 g/dL (ref 6.5–8.1)

## 2022-01-18 LAB — BASIC METABOLIC PANEL
Anion gap: 14 (ref 5–15)
BUN: 65 mg/dL — ABNORMAL HIGH (ref 8–23)
CO2: 26 mmol/L (ref 22–32)
Calcium: 9.7 mg/dL (ref 8.9–10.3)
Chloride: 98 mmol/L (ref 98–111)
Creatinine, Ser: 3 mg/dL — ABNORMAL HIGH (ref 0.61–1.24)
GFR, Estimated: 23 mL/min — ABNORMAL LOW (ref >60)
Glucose, Bld: 303 mg/dL — ABNORMAL HIGH (ref 70–99)
Potassium: 4.5 mmol/L (ref 3.5–5.1)
Sodium: 138 mmol/L (ref 135–145)

## 2022-01-18 LAB — AMMONIA: Ammonia: 24 umol/L (ref 9–35)

## 2022-01-18 LAB — CBC
HCT: 45 % (ref 39.0–52.0)
Hemoglobin: 15.2 g/dL (ref 13.0–17.0)
MCH: 29.5 pg (ref 26.0–34.0)
MCHC: 33.8 g/dL (ref 30.0–36.0)
MCV: 87.2 fL (ref 80.0–100.0)
Platelets: 225 10*3/uL (ref 150–400)
RBC: 5.16 MIL/uL (ref 4.22–5.81)
RDW: 12.3 % (ref 11.5–15.5)
WBC: 11.5 10*3/uL — ABNORMAL HIGH (ref 4.0–10.5)
nRBC: 0 % (ref 0.0–0.2)

## 2022-01-18 LAB — CBG MONITORING, ED: Glucose-Capillary: 250 mg/dL — ABNORMAL HIGH (ref 70–99)

## 2022-01-18 LAB — HEPATITIS PANEL, ACUTE
HCV Ab: NONREACTIVE
Hep A IgM: NONREACTIVE
Hep B C IgM: NONREACTIVE
Hepatitis B Surface Ag: NONREACTIVE

## 2022-01-18 LAB — LIPASE, BLOOD: Lipase: 41 U/L (ref 11–51)

## 2022-01-18 LAB — TROPONIN I (HIGH SENSITIVITY): Troponin I (High Sensitivity): 38 ng/L — ABNORMAL HIGH (ref <18)

## 2022-01-18 MED ORDER — SODIUM CHLORIDE 0.9 % IV BOLUS
1000.0000 mL | Freq: Once | INTRAVENOUS | Status: AC
  Administered 2022-01-18: 1000 mL via INTRAVENOUS

## 2022-01-18 NOTE — Progress Notes (Signed)
Inpatient Diabetes Program Recommendations ? ?AACE/ADA: New Consensus Statement on Inpatient Glycemic Control (2015) ? ?Target Ranges:  Prepandial:   less than 140 mg/dL ?     Peak postprandial:   less than 180 mg/dL (1-2 hours) ?     Critically ill patients:  140 - 180 mg/dL  ? ?Lab Results  ?Component Value Date  ? GLUCAP 235 (H) 01/06/2022  ? HGBA1C 8.6 (H) 01/06/2022  ? ? ?Review of Glycemic Control ? Latest Reference Range & Units 01/18/22 10:47  ?Glucose 70 - 99 mg/dL 303 (H)  ? ?Diabetes history: DM 2 ?Outpatient Diabetes medications:  ?Glucotrol XL 5 mg daily ?Current orders for Inpatient glycemic control:  ?None ?  ?Inpatient Diabetes Program Recommendations:   ? ?If plan for admission, consider adding: ?-Novolog 0-9 units TID & Hs ?-Levemir 6 units QD.  ? ?Thanks, ?Bronson Curb, MSN, RNC-OB ?Diabetes Coordinator ?[phone] (8a-5p) ? ?

## 2022-01-18 NOTE — ED Notes (Signed)
IV d/c ?

## 2022-01-18 NOTE — Discharge Instructions (Addendum)
?  You were seen in the emergency department for dehydration and weakness.  Your blood test show that you are dehydrated.  We talked about the importance of drinking plain, regular water, and staying away from soda, sweet tea and juice, which are loaded with sugar and will likely dehydrate you further.  Sugary drinks will also make your diabetes worse by making your blood sugars go up. ? ?Your blood test show that you have some liver enzyme abnormalities.  Thankfully these are stable.  We do not see signs of inflammation around your gallbladder or abnormalities on your ultrasound of your liver.  You do have a blood test pending for hepatitis, which your doctor can follow-up on. ? ?Do not drink alcohol or use any new drugs or medications without discussing them with your doctor at home. ?

## 2022-01-18 NOTE — ED Notes (Signed)
;  pt first attempt unsuccessful pt has label specimen cup  ? ?

## 2022-01-18 NOTE — ED Provider Notes (Signed)
?[facility] ?Provider Note ? ? ?CSN: [id number] ?Arrival date & time: 01/18/22  1024 ? ?  ? ?History ? ?Chief Complaint  ?Patient presents with  ? Weakness  ? Dizziness  ? Headache  ? ? ?John Moyer is a 66 y.o. male presenting to emergency department as a referral from his PCPs office for weakness, loss of appetite and blood test abnormalities.  The patient reports that his doctor told him to come to the ER for a "scan of my brain because have been having shaking" and also for further work-up for liver abnormalities.  The patient was seen in the ED yesterday, per my review of the records, and had a transaminitis at that time.  He has chronic kidney disease with his creatinine fairly close to baseline level.  He does not drink a lot of water per his own admission.  He denies drinking alcohol or history of cirrhosis or liver disease.  Denies history of abdominal surgery.  He reports that he sometimes "gets to be shaking all over". ? ?He was admitted in the hospital most recently 2 weeks ago in March 2023 with AKI, DKA, and a UTI ? ?HPI ? ?  ? ?Home Medications ?Prior to Admission medications   ?Medication Sig Start Date End Date Taking? Authorizing Provider  ?amLODipine (NORVASC) 2.5 MG tablet Take 2.5 mg by mouth daily.   Yes [provider]  ?aspirin EC 81 MG tablet Take 81 mg by mouth daily. Swallow whole.   Yes [provider]  ?atorvastatin (LIPITOR) 80 MG tablet Take 80 mg by mouth daily.   Yes [provider]  ?carvedilol (COREG) 3.125 MG tablet Take 3.125 mg by mouth 2 (two) times daily with a meal.   Yes [provider]  ?Evolocumab (REPATHA SURECLICK) 086 MG/ML SOAJ Inject 140 mg into the skin every 14 (fourteen) days. 11/15/21  Yes Patwardhan, Manish J, MD  ?ferrous sulfate 325 (65 FE) MG tablet Take 325 mg by mouth daily.   Yes [provider]  ?furosemide (LASIX) 20 MG tablet Take 20 mg by mouth daily. 12/28/21  Yes  [provider]  ?SEMGLEE, YFGN, 100 UNIT/ML Pen Inject 8 Units into the skin every evening. 01/07/22  Yes [provider]  ?TRULICITY 1.5 VH/8.4ON SOPN Inject 1.5 mg into the skin every Friday. 01/14/22  Yes [provider]  ?glipiZIDE (GLUCOTROL XL) 5 MG 24 hr tablet Take 1 tablet (5 mg total) by mouth daily. ?Patient not taking: Reported on 01/18/2022 01/06/22 02/05/22  Dana Allan I, MD  ?ondansetron (ZOFRAN) 4 MG tablet Take 1 tablet (4 mg total) by mouth every 6 (six) hours as needed for nausea or vomiting. ?Patient not taking: Reported on 03/19/9527 01/16/31   Delora Fuel, MD  ?[facility] VERIO test strip 1 each daily. 08/26/21   [provider]  ?   ? ?Allergies    ?Patient has no known allergies.   ? ?Review of Systems   ?Review of Systems ? ?Physical Exam ?Updated Vital Signs ?BP (!) 147/88   Pulse 77   Temp (!) 97.5 ?F (36.4 ?C) (Oral)   Resp 17   Ht 5' 10.5" (1.791 m)   Wt 74.8 kg   SpO2 99%   BMI 23.34 kg/m?  ?Physical Exam ?Constitutional:   ?   General: He is not in acute distress. ?HENT:  ?   Head: Normocephalic and atraumatic.  ?Eyes:  ?   Conjunctiva/sclera: Conjunctivae normal.  ?  Pupils: Pupils are equal, round, and reactive to light.  ?Cardiovascular:  ?   Rate and Rhythm: Normal rate and regular rhythm.  ?Pulmonary:  ?   Effort: Pulmonary effort is normal. No respiratory distress.  ?Abdominal:  ?   General: Bowel sounds are normal. There is no distension.  ?   Palpations: Abdomen is soft.  ?   Tenderness: There is no abdominal tenderness.  ?Skin: ?   General: Skin is warm and dry.  ?Neurological:  ?   General: No focal deficit present.  ?   Mental Status: He is alert. Mental status is at baseline.  ?Psychiatric:     ?   Mood and Affect: Mood normal.     ?   Behavior: Behavior normal.  ? ? ?ED Results / Procedures / Treatments   ?Labs ?(all labs ordered are listed, but only abnormal results are displayed) ?Labs Reviewed  ?BASIC METABOLIC PANEL - Abnormal;  Notable for the following components:  ?    Result Value  ? Glucose, Bld 303 (*)   ? BUN 65 (*)   ? Creatinine, Ser 3.00 (*)   ? GFR, Estimated 23 (*)   ? All other components within normal limits  ?CBC - Abnormal; Notable for the following components:  ? WBC 11.5 (*)   ? All other components within normal limits  ?URINALYSIS, ROUTINE W REFLEX MICROSCOPIC - Abnormal; Notable for the following components:  ? Glucose, UA >=500 (*)   ? Ketones, ur 5 (*)   ? Bacteria, UA RARE (*)   ? All other components within normal limits  ?HEPATIC FUNCTION PANEL - Abnormal; Notable for the following components:  ? AST 160 (*)   ? ALT 187 (*)   ? Alkaline Phosphatase 151 (*)   ? All other components within normal limits  ?CBG MONITORING, ED - Abnormal; Notable for the following components:  ? Glucose-Capillary 250 (*)   ? All other components within normal limits  ?TROPONIN I (HIGH SENSITIVITY) - Abnormal; Notable for the following components:  ? Troponin I (High Sensitivity) 38 (*)   ? All other components within normal limits  ?RESP PANEL BY RT-PCR (FLU A&B, COVID) [id number]  ?LIPASE, BLOOD  ?AMMONIA  ?HEPATITIS PANEL, ACUTE  ? ? ?EKG ?EKG Interpretation ? ?Date/Time:  Tuesday January 18 2022 11:59:27 EDT ?Ventricular Rate:  81 ?PR Interval:  253 ?QRS Duration: 168 ?QT Interval:  473 ?QTC Calculation: 550 ?R Axis:   102 ?Text Interpretation: Sinus rhythm Prolonged PR interval  LBBB pattern - unchanged from prior tracings (April, March 2023) Prolonged QT interval Overall no significant changes from prior tracings Confirmed by Octaviano Glow [id number]) on 01/18/2022 12:08:14 PM ? ?Radiology ?CT ABDOMEN PELVIS WO CONTRAST ? ?Result Date: 01/18/2022 ?CLINICAL DATA:  Nausea, vomiting, abnormal liver function tests EXAM: CT ABDOMEN AND PELVIS WITHOUT CONTRAST TECHNIQUE: Multidetector CT imaging of the abdomen and pelvis was performed following the standard protocol without IV contrast. RADIATION DOSE REDUCTION: This exam was performed according to  the departmental dose-optimization program which includes automated exposure control, adjustment of the mA and/or kV according to patient size and/or use of iterative reconstruction technique. COMPARISON:  Abdominal sonogram done earlier today FINDINGS: Lower chest: Motion artifacts limit evaluation. There is 8 mm nodular density in the lateral segment of right middle lobe. Size evaluation is limited by motion artifacts. Pacer leads are noted in place. Hepatobiliary: No focal abnormality is seen in the liver. There is no dilation of bile ducts. Gallbladder is not distended.  Pancreas: No focal abnormality is seen. Spleen: Small calcified nodule is seen in the medial aspect of spleen. Adrenals/Urinary Tract: Adrenals are unremarkable. There is no hydronephrosis. There are small calcifications in the renal artery branches. Ureters are not dilated. There is mild diffuse wall thickening in the urinary bladder. Urinary bladder is not distended. Stomach/Bowel: Moderate distention is seen in the stomach with fluid in the lumen. There is no significant small bowel dilation. Appendix is not dilated. There is no significant wall thickening in colon. There is no pericolic stranding. Vascular/Lymphatic: There are scattered arterial calcifications. Reproductive: Prostate is enlarged projecting into the base of the bladder. There are scattered coarse calcifications in the prostate. Other: There is no ascites or pneumoperitoneum. Musculoskeletal: Unremarkable. IMPRESSION: There is no evidence of intestinal obstruction or pneumoperitoneum. Appendix is not dilated. There is no hydronephrosis. There is subcentimeter nodule in the right middle lobe which is not fully evaluated due to motion artifacts. Follow-up CT chest may be considered. Enlarged prostate. Mild diffuse wall thickening in the urinary bladder may be due to incomplete distention or suggest chronic outlet obstruction or cystitis. Electronically Signed   By: Elmer Picker M.D.   On: 01/18/2022 14:38  ? ?CT HEAD WO CONTRAST (5MM) ? ?Result Date: 01/18/2022 ?CLINICAL DATA:  Mental status change.  Dizziness and weakness 3 days EXAM: CT HEAD WITHOUT CONTRAST TECHNIQUE: Contiguous axi

## 2022-01-18 NOTE — ED Notes (Signed)
Pt verbalized understanding of d/c instructions, meds and followup care. Denies questions. VSS, no distress noted. Steady gait to exit with all belongings.  ?

## 2022-01-18 NOTE — ED Triage Notes (Addendum)
Pt. Stated, I was sent over from the Dr.'s office for weakness , dizziness , and loss of appetite for about 3 days and said I need a CAT scan  cause I shake sometimes. ?

## 2022-01-19 ENCOUNTER — Other Ambulatory Visit (HOSPITAL_COMMUNITY): Payer: Self-pay | Admitting: Internal Medicine

## 2022-01-19 DIAGNOSIS — I714 Abdominal aortic aneurysm, without rupture, unspecified: Secondary | ICD-10-CM

## 2022-01-19 DIAGNOSIS — I739 Peripheral vascular disease, unspecified: Secondary | ICD-10-CM

## 2022-01-21 ENCOUNTER — Other Ambulatory Visit (HOSPITAL_COMMUNITY): Payer: 59

## 2022-01-24 ENCOUNTER — Ambulatory Visit: Payer: 59 | Admitting: Cardiology

## 2022-01-24 NOTE — Progress Notes (Deleted)
? ? ?Patient referred by [name], MD for coronary artery disease ? ?Subjective:  ? ?John Moyer, male    DOB: [birth year], 66 y.o.   MRN: [id number] ? ? ?No chief complaint on file. ? ? ? ?HPI ? ?66 y.o. Caucasian male with coronary artery disease s/p CABG, type 2 diabetes mellitus, h/o stroke, former smoker ? ?*** ?Patient denies chest pain, shortness of breath, palpitations, leg edema, orthopnea, PND, TIA/syncope. ?It appears that patient never started Repatha.  ? ?Reviewed external medical records. ?Discharge summary from 09/2019 at [facility] in [place].  Patient presented to the [facility] on December 14 after cardiac arrest with remarkable neurological improvement.  He reportedly had bradycardia/asystole, underwent CPR, temporary pacing and eventually a permanent pacemaker.  Further records, visit from angiogram was not deemed necessary given the patient's presentation was not with STEMI. ? ?Of note, patient had previously undergone CABG x4 in 2019 and previously was known to have ischemic cardiomyopathy with EF of 20%.  He is not on ACE or ARB due to hyperkalemia. ? ?Initial consultation HPI: ?Patient is originally from [place], moved to [place] in January 2022. Patient is here with his wife today.  Patient had extremely poor historian, so is difficult to piece history together.  It sounds like patient has had a stroke couple years ago.  In December 2020 or 2021 (there is disagreement between patient and his wife), patient underwent coronary bypass surgery.  Either before or after the surgery, wife tells me that patient "died for 6 minutes", EMS were called and he was hospitalized locally and then transferred to "[facility]" in [place], [place].  He reportedly had a kidney infection.  Again, details are very limited. ? ?Currently, patient ambulates using a walker.  Activities limited to walking around the house and on the deck.  With this level of  activity, denies any complaints of chest pain, shortness of breath, residual significant weakness from previous stroke etc.  Patient is going to undergo colonoscopy.  He could not tell me whether this was a screening colonoscopy or because of any GI symptoms. ? 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 ? ?Current Outpatient Medications:  ?  amLODipine (NORVASC) 2.5 MG tablet, Take 2.5 mg by mouth daily., Disp: , Rfl:  ?  aspirin EC 81 MG tablet, Take 81 mg by mouth daily. Swallow whole., Disp: , Rfl:  ?  atorvastatin (LIPITOR) 80 MG tablet, Take 80 mg by mouth daily., Disp: , Rfl:  ?  carvedilol (COREG) 3.125 MG tablet, Take 3.125 mg by mouth 2 (two) times daily with a meal., Disp: , Rfl:  ?  Evolocumab (REPATHA SURECLICK) 702 MG/ML SOAJ, Inject 140 mg into the skin every 14 (fourteen) days., Disp: 6 mL, Rfl: 1 ?  ferrous sulfate 325 (65 FE) MG tablet, Take 325 mg by mouth daily., Disp: , Rfl:  ?  furosemide (LASIX) 20 MG tablet, Take 20 mg by mouth daily., Disp: , Rfl:  ?  glipiZIDE (GLUCOTROL XL) 5 MG 24 hr tablet, Take 1 tablet (5 mg total) by mouth daily. (Patient not taking: Reported on 01/18/2022), Disp: 30 tablet, Rfl: 0 ?  ondansetron (ZOFRAN) 4 MG tablet, Take 1 tablet (4 mg total) by mouth every 6 (six) hours as needed for nausea or vomiting. (Patient not taking: Reported on 01/18/2022), Disp: 12 tablet, Rfl: 0 ?  ONETOUCH VERIO test strip, 1  each daily., Disp: , Rfl:  ?  SEMGLEE, YFGN, 100 UNIT/ML Pen, Inject 8 Units into the skin every evening., Disp: , Rfl:  ?  TRULICITY 1.5 EN/4.0HW SOPN, Inject 1.5 mg into the skin every Friday., Disp: , Rfl:  ? ? ? ?Cardiovascular and other pertinent studies: ? ?Echocardiogram 07/22/2021:  ?Left ventricle cavity is normal in size and wall thickness. Abnormal  ?septal wall motion due to post-operative coronary artery bypass graft.  ?Normal LV systolic function with EF 56%. Indeterminate diastolic filling   ?pattern.  ?Moderate tricuspid regurgitation. Estimated pulmonary artery systolic  ?pressure 28 mmHg. ? ?Scheduled  In office pacemaker check 11/04/2021: ?Single (S)/Dual (D)/BV: D. ?Presenting ASVP @ 75/min. ?Pacemaker dependant:  Yes. Underlying CHB with A rate 75/min. AP 2%, VP 100%  ?AMS Episodes 0.  ?HVR 0.  ?Longevity 7 Years. Magnet rate: >85%. ?Lead measurements: Stable. ?Histogram: Low (L)/normal (N)/high (H)  Normal. Patient activity Good.  ? ?Observations: Normal pacemaker function. Changes: None. ? ?EKG 07/08/2021: ?Sinus rhythm 74 bpm ?Ventricular paced rhythm ? ? ?Recent labs: ?06/11/2021: ?Glucose 113, BUN/Cr 48/1.79. eGFR 41. Na/K 142/5.0. Rest of the CMP normal ?H/H 12/39. MCV 90. Platelets 146 ?Chol 148, TG 135, HDL 39, LDL 86  ? ? ?Review of Systems  ?Cardiovascular:  Negative for chest pain, dyspnea on exertion, leg swelling, palpitations and syncope.  ?Neurological:  Positive for loss of balance.  ? ?   ? ? ?There were no vitals filed for this visit. ? ? ? ?There is no height or weight on file to calculate BMI. ?There were no vitals filed for this visit. ? ? ? ?Objective:  ? Physical Exam ?Vitals and nursing note reviewed.  ?Constitutional:   ?   General: He is not in acute distress. ?Neck:  ?   Vascular: No JVD.  ?Cardiovascular:  ?   Rate and Rhythm: Normal rate and regular rhythm.  ?   Pulses: Normal pulses.  ?   Heart sounds: Normal heart sounds. No murmur heard. ?Pulmonary:  ?   Effort: Pulmonary effort is normal.  ?   Breath sounds: Normal breath sounds. No wheezing or rales.  ?Chest:  ?   Comments: Scars of sternotomy and left upper pectoral device placement ?Musculoskeletal:  ?   Right lower leg: No edema.  ?   Left lower leg: No edema (\).  ? ? ? ?   ? ?Assessment & Recommendations:  ? ?66 y.o. Caucasian male with coronary artery disease s/p CABG (2019), h/o asystole cardiac arrest and sinus node dysfunction (2020) requiring placement of dual chamber PPM, type 2 diabetes mellitus, CKD 3,  h/o stroke, former smoker ? ?CAD: ?S/p CABG,  ?Continue aspirin, statin, carvedilol. ?LDL 86 on atorvastatin 80 mg, ezetimibe 10 mg. ?Recommend addition of Repatha with gloal LDL <55 ? ?*** ? ?H/o ischemic cardiomyopathy: ?EF recovered to normal (Echocardiogram 07/2021) ?  ?Sinus node dysfunction: ?Presentation with asystole cardiac arrest in 2020, underwent dual chamber pacemaker placement ?Functioning well ? ?F/u in 3 months after repeat lipid panel ? ? ?Nigel Mormon, MD ?Pager: [phone] ?Office: [phone]  ?

## 2022-01-26 ENCOUNTER — Ambulatory Visit (HOSPITAL_BASED_OUTPATIENT_CLINIC_OR_DEPARTMENT_OTHER)
Admission: RE | Admit: 2022-01-26 | Discharge: 2022-01-26 | Disposition: A | Discharge status: Home / Self Care | Payer: 59 | Source: Ambulatory Visit | Attending: Cardiology | Admitting: Cardiology

## 2022-01-26 ENCOUNTER — Ambulatory Visit (HOSPITAL_COMMUNITY)
Admission: RE | Admit: 2022-01-26 | Discharge: 2022-01-26 | Disposition: A | Discharge status: Home / Self Care | Payer: 59 | Source: Ambulatory Visit | Attending: Cardiology | Admitting: Cardiology

## 2022-01-26 DIAGNOSIS — I714 Abdominal aortic aneurysm, without rupture, unspecified: Secondary | ICD-10-CM | POA: Insufficient documentation

## 2022-01-26 DIAGNOSIS — I739 Peripheral vascular disease, unspecified: Secondary | ICD-10-CM | POA: Insufficient documentation

## 2022-02-06 NOTE — Discharge Summary (Signed)
?Physician Discharge Summary ?  ?Patient: John Moyer MRN: [id number] DOB: 02/12/1956  ?Admit date:     01/05/2022  ?Discharge date: 01/06/2022  ?Discharge Physician: Bonnell Public  ? ?PCP: [name], MD  ? ?Recommendations at discharge:  ? ? Follow up with PCP within 1 week of discharge ? ?Discharge Diagnoses: ?Principal Problem: ?  DKA, type 2 ([place]) ?Active Problems: ?  Acute lower UTI ?  Acute kidney injury superimposed on chronic kidney disease ([place]) ?  Coronary artery disease ?  Dyslipidemia ?  Essential hypertension ? ?Resolved Problems: ?  * No resolved hospital problems. * ? ?Hospital Course: ?[name] is a 66 y.o. Caucasian male with medical history significant for coronary artery disease, type diabetes mellitus, CVA and pacemaker placement, presented to the ER with acute onset of generalized fatigue and tiredness for about a week.  The patient stated that his PCP changes diabetic medication to Trulicity and his insurance denied coverage but he never complicated that to his physician.  As a result he has not been on any diabetic medications for 4 weeks.  He was seen yesterday by his PCP and his blood glucose was reading high and his systolic BP was in the 18A.  [facility].  He was found to have hyperglycemia with associated mild DKA.  He admitted to recent polyuria and polydipsia.  He denies any nausea or vomiting or abdominal pain.  No dysuria, oliguria, urinary urgency or flank pain.  No chest pain or dyspnea or cough or wheezing. ?  ?ED Course: Upon presentation to the emergency room his BP was 89/68 with otherwise normal vital signs.  Later on with hydration BP was up to  121/89.  Labs revealed a VBG with pH 7.3 with HCO3 of 24.8 but earlier his BMP showed anion gap of 15 with CO2 of 18 and sodium of 129 with a potassium of 5.7, blood glucose of 462 with a BUN of 77 and creatinine 2.89.  CBC showed leukocytosis of 10.8 and beta  hydroxybutyrate was 0.83.  Influenza antigens and COVID-19 PCR came back negative.  Repeat sodium later on was 137 after hydration and potassium 4.4.  Around midnight his sodium came back 138 with CO2 of 23 and anion gap of 10.  Lactic acid was 2.3 and later 1.3.  UA was positive for UTI. ?EKG as reviewed by me : Showed paced rhythm with a rate of 76 . ?  ?Imaging: Portable chest ray showed left chest wall 2-lead pacemaker with ventricular lead appearing slightly more superior than expected with recommendation for correlation with prior chest x-ray. ? ?The patient was given 8 units of IV NovoLog, IV lactated ringer bolus.  DKA protocol, IV Rocephin for his UTI.  He is directly admitted to medical telemetry bed for further evaluation and management ? ?Patient was admitted for further management.  Patient has been optimized.  Patient will be discharged today ? ?Assessment and Plan: ?* DKA, type 2 ([place]) ?- This is gradually resolving. ?- The patient was admitted to a medical telemetry bed. ?- We will continue hydration with IV normal saline. ?- We will place him on highly resistant NovoLog subcutaneously with frequent fingerstick blood glucose measures. ?- We will check his hemoglobin A1c. ?- We will follow his BMP. ?- We will continue Glucotrol XL and place him on diabetic diet. ? ?DKA has resolved. Patient will be discharged. ? ?Acute lower UTI ?- This  could be the culprit for his DKA. ?- We will place on IV Rocephin and follow urine culture and sensitivity. ? ?Urine culture grew Serratia.  Patient was treated with antibiotics. ? 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 ?- She will be hydrated with IV normal saline as mentioned above and will follow BMP. ?- We will avoid nephrotoxins. ? ?Essential hypertension ?- We will continue his antihypertensives including Coreg and amlodipine. ? ?Dyslipidemia ?- We will continue statin  therapy. ? ?Coronary artery disease ?- We will continue his aspirin, Coreg and statin therapy. ? ? ? ? ?  ? ? ?Consultants: none ?Procedures performed: None  ?Disposition: Home ?Diet recommendation:  ?Discharge Diet Orders (From admission, onward)  ? ?  Start     Ordered  ? 01/06/22 0000  Diet - low sodium heart healthy       ? 01/06/22 1153  ? 01/06/22 0000  Diet Carb Modified       ? 01/06/22 1153  ? ?  ?  ? ?  ? ?Carb modified diet ?DISCHARGE MEDICATION: ?Allergies as of 01/06/2022   ?No Known Allergies ?  ? ?  ?Medication List  ?  ? ?STOP taking these medications   ? ?alum & mag hydroxide-simeth 200-200-20 MG/5ML suspension ?Commonly known as: MAALOX/MYLANTA ?  ?COQ-10 PO ?  ? ?  ? ?TAKE these medications   ? ?amLODipine 2.5 MG tablet ?Commonly known as: NORVASC ?Take 2.5 mg by mouth daily. ?  ?aspirin EC 81 MG tablet ?Take 81 mg by mouth daily. Swallow whole. ?  ?atorvastatin 80 MG tablet ?Commonly known as: LIPITOR ?Take 80 mg by mouth daily. ?  ?carvedilol 3.125 MG tablet ?Commonly known as: COREG ?Take 3.125 mg by mouth 2 (two) times daily with a meal. ?  ?ferrous sulfate 325 (65 FE) MG tablet ?Take 325 mg by mouth daily. ?  ?glipiZIDE 5 MG 24 hr tablet ?Commonly known as: GLUCOTROL XL ?Take 1 tablet (5 mg total) by mouth daily. ?  ?OneTouch Verio test strip ?Generic drug: glucose blood ?1 each daily. ?  ?Repatha SureClick 194 MG/ML Soaj ?Generic drug: Evolocumab ?Inject 140 mg into the skin every 14 (fourteen) days. ?  ? ?  ? ?ASK your doctor about these medications   ? ?cefdinir 300 MG capsule ?Commonly known as: OMNICEF ?Take 1 capsule (300 mg total) by mouth 2 (two) times daily for 5 days. ?Ask about: Should I take this medication? ?  ? ?  ? ? Follow-up Information   ? ? Bakare, Mobolaji B, MD Follow up.   ?Specialty: Internal Medicine ?Contact information: ?[address] ?[place] [address] ?[phone] ? ? ?  ?  ? ? [name], MD. Daphane Shepherd on 01/07/2022.   ?Specialty: Internal  Medicine ?Why: You have an apt tomorrow at 10:15 to see NP [place]. ?Contact information: ?[address] ?[place] [address] ?[phone] ? ? ?  ?  ? ?  ?  ? ?  ? ?Discharge Exam: ?Danley Danker Weights  ? 01/05/22 1738  ?Weight: 74.8 kg  ? ? ? ?Condition at discharge: stable ? ?The results of significant diagnostics from this hospitalization (including imaging, microbiology, ancillary and laboratory) are listed below for reference.  ? ?Imaging Studies: ?CT ABDOMEN PELVIS WO CONTRAST ? ?Result Date: 01/18/2022 ?CLINICAL DATA:  Nausea, vomiting, abnormal liver function tests EXAM: CT ABDOMEN AND PELVIS WITHOUT CONTRAST TECHNIQUE: Multidetector CT imaging of  the abdomen and pelvis was performed following the standard protocol without IV contrast. RADIATION DOSE REDUCTION: This exam was performed according to the departmental dose-optimization program which includes automated exposure control, adjustment of the mA and/or kV according to patient size and/or use of iterative reconstruction technique. COMPARISON:  Abdominal sonogram done earlier today FINDINGS: Lower chest: Motion artifacts limit evaluation. There is 8 mm nodular density in the lateral segment of right middle lobe. Size evaluation is limited by motion artifacts. Pacer leads are noted in place. Hepatobiliary: No focal abnormality is seen in the liver. There is no dilation of bile ducts. Gallbladder is not distended. Pancreas: No focal abnormality is seen. Spleen: Small calcified nodule is seen in the medial aspect of spleen. Adrenals/Urinary Tract: Adrenals are unremarkable. There is no hydronephrosis. There are small calcifications in the renal artery branches. Ureters are not dilated. There is mild diffuse wall thickening in the urinary bladder. Urinary bladder is not distended. Stomach/Bowel: Moderate distention is seen in the stomach with fluid in the lumen. There is no significant small bowel dilation. Appendix is not dilated. There is no  significant wall thickening in colon. There is no pericolic stranding. Vascular/Lymphatic: There are scattered arterial calcifications. Reproductive: Prostate is enlarged projecting into the base of the bladder. There are scattered coa

## 2022-03-09 ENCOUNTER — Encounter: Payer: Self-pay | Admitting: Physical Therapy

## 2022-03-09 ENCOUNTER — Ambulatory Visit (INDEPENDENT_AMBULATORY_CARE_PROVIDER_SITE_OTHER): Payer: Self-pay | Admitting: Podiatry

## 2022-03-09 ENCOUNTER — Ambulatory Visit: Payer: 59 | Attending: Internal Medicine | Admitting: Physical Therapy

## 2022-03-09 DIAGNOSIS — Z91199 Patient's noncompliance with other medical treatment and regimen due to unspecified reason: Secondary | ICD-10-CM

## 2022-03-09 DIAGNOSIS — R2689 Other abnormalities of gait and mobility: Secondary | ICD-10-CM | POA: Diagnosis present

## 2022-03-09 DIAGNOSIS — M6281 Muscle weakness (generalized): Secondary | ICD-10-CM | POA: Diagnosis present

## 2022-03-09 DIAGNOSIS — R262 Difficulty in walking, not elsewhere classified: Secondary | ICD-10-CM | POA: Diagnosis present

## 2022-03-09 DIAGNOSIS — R2681 Unsteadiness on feet: Secondary | ICD-10-CM | POA: Diagnosis not present

## 2022-03-09 NOTE — Therapy (Signed)
OUTPATIENT PHYSICAL THERAPY LOWER EXTREMITY EVALUATION   Patient Name: John Moyer MRN: [id number] DOB:[birth year], 66 y.o., male Today's Date: 03/09/2022   PT End of Session - 03/09/22 1430     Visit Number 1    Date for PT Re-Evaluation 06/01/22    PT Start Time 0930    PT Stop Time 7628    PT Time Calculation (min) 44 min    Equipment Utilized During Treatment Gait belt    Activity Tolerance Patient tolerated treatment well    Behavior During Therapy [facility] for tasks assessed/performed             Past Medical History:  Diagnosis Date   CAD (coronary artery disease)    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/24/2021   Pacemaker [place] MRI Dual chamber pacemaker 07/24/2021   Stroke ([place])    Type 2 diabetes mellitus ([place])    Past Surgical History:  Procedure Laterality Date   COLONOSCOPY WITH PROPOFOL N/A 11/26/2021   Procedure: COLONOSCOPY WITH PROPOFOL;  Surgeon: Carol Ada, MD;  [facility];  Service: Endoscopy;  Laterality: N/A;   CORONARY ARTERY BYPASS GRAFT     POLYPECTOMY  11/26/2021   Procedure: POLYPECTOMY;  Surgeon: Carol Ada, MD;  [facility];  Service: Endoscopy;;   Patient Active Problem List   Diagnosis Date Noted   Dyslipidemia 01/06/2022   Essential hypertension 01/06/2022   Acute lower UTI 01/06/2022   Acute kidney injury superimposed on chronic kidney disease ([place]) 01/06/2022   DKA, type 2 ([place]) 01/05/2022   Sinus node dysfunction ([place]) 11/12/2021   Type 2 diabetes mellitus ([place]) 09/02/2021   Mixed hyperlipidemia 08/12/2021   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 He has not been eating or drinking well. He had 3 falls 2-3 months ago, scraping his knee and causing a steady decline in strength and mobility.  PERTINENT HISTORY: Low blood pressure Gastroesophageal reflux disease Physical deconditioning Weight loss Insomnia  PAIN:  Are you having pain? No  PRECAUTIONS: None  WEIGHT BEARING RESTRICTIONS No  FALLS:  Has patient fallen in last 6 months? Yes. Number of falls 3  LIVING ENVIRONMENT: Lives with: lives with their family, lives with their partner, lives with their son, and lives with their daughter Lives in: House/apartment Stairs: No Has following equipment at home: Environmental consultant - 4 wheeled, Wheelchair (manual), and shower chair  OCCUPATION: Retired  PLOF: Needs assistance with ADLs  PATIENT GOALS Get stronger and walk again.    OBJECTIVE:    COGNITION:  Overall cognitive status: Within functional limits for tasks assessed     SENSATION: Not tested    MUSCLE LENGTH: Hamstrings: Right 65 deg; Left 65 deg   POSTURE: rounded shoulders and forward head  PALPATION: No TTP  LOWER EXTREMITY ROM: Grossly WFL except SLR and B DF limited.  LOWER EXTREMITY MMT:  MMT Right eval Left eval  Hip flexion 4- 4-  Hip extension 4- 4-  Hip abduction 4- 4-  Hip adduction    Hip internal rotation    Hip external rotation    Knee flexion 4- 4-  Knee extension 4- 4-  Ankle dorsiflexion 4- 4-  Ankle plantarflexion 4- 4-  Ankle inversion    Ankle eversion     (Blank rows = not tested)    FUNCTIONAL TESTS:  5 times sit to stand: 23.35 Timed up and go (TUG): 21.62 Functional gait assessment: TBD  GAIT: Distance walked: 150',  Assistive device utilized: Walker  - 2 wheeled Level of assistance: CGA Comments: Level surfaces. Unsteady, small steps, slow TODAY'S TREATMENT: Performed transfers, bed moiblity re-educaiton and gait training.   PATIENT EDUCATION:  Education details: {OC  Person educated: Patient and Spouse Education method: Explanation Education comprehension: verbalized understanding   HOME EXERCISE PROGRAM: TBD  ASSESSMENT:  CLINICAL IMPRESSION: Patient is a 66 y.o. who was seen today for physical therapy evaluation and treatment for generalized weakness, gait abnormality.    OBJECTIVE IMPAIRMENTS Weakness, decreased blaance, unsteadiness on feet, difficulty walking, decreased ROM, decreased flexibility, decreased posture  ACTIVITY LIMITATIONS carrying, lifting, bending, standing, squatting, stairs, transfers, bed mobility, bathing, toileting, dressing, self feeding, reach over head, hygiene/grooming, and locomotion level  PARTICIPATION LIMITATIONS: meal prep, cleaning, driving, shopping, community activity, and yard work  [place], Past/current experiences, and 1 comorbidity: Kidney disease,  are also affecting patient's functional outcome.   REHAB POTENTIAL: Good  CLINICAL DECISION MAKING: Evolving/moderate complexity  EVALUATION COMPLEXITY: Moderate   GOALS: Goals reviewed with patient? Yes  SHORT TERM GOALS: Target date: 04/06/2022   I with initial HEP Baseline: Goal status: INITIAL  2.  5 x STS in < 15 sec to demonstrate improved balance and strength. Baseline:  Goal status: INITIAL  3.  TUG in <15 sec to demonstrate improved balance. Baseline:  Goal status: INITIAL   LONG TERM GOALS: Target date: 06/01/2022   I with final HEP Baseline:  Goal status: INITIAL   2.  5x STS in < 12 sec. Baseline:  Goal status: INITIAL  3.  TUG in < 12 sec Baseline:  Goal status: INITIAL  4.  FGA of at least 24/30 Baseline:  Goal status: INITIAL  5.  Ambulate at least 400' with LRAD,  S Baseline:  Goal status: INITIAL   PLAN: PT FREQUENCY: 2x/week  PT DURATION: 12 weeks  PLANNED INTERVENTIONS: Therapeutic exercises, Therapeutic activity, Neuromuscular re-education, Balance training, Gait training, Patient/Family education, and Manual therapy  PLAN FOR NEXT SESSION: Complete FGA, initiate HEP for strength and balance.   Marcelina Morel, PT 03/09/2022, 2:35 PM

## 2022-03-10 NOTE — Therapy (Signed)
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[place]) 07/24/2021   Pacemaker [place] MRI Dual chamber pacemaker 07/24/2021   Stroke ([place])    Type 2 diabetes mellitus ([place])    Past Surgical History:  Procedure Laterality Date   COLONOSCOPY WITH PROPOFOL N/A 11/26/2021   Procedure: COLONOSCOPY WITH PROPOFOL;  Surgeon: Carol Ada, MD;  [facility];  Service: Endoscopy;  Laterality: N/A;   CORONARY ARTERY BYPASS GRAFT     POLYPECTOMY  11/26/2021   Procedure: POLYPECTOMY;  Surgeon: Carol Ada, MD;  [facility];  Service: Endoscopy;;   Patient Active Problem List   Diagnosis Date Noted   Dyslipidemia 01/06/2022   Essential hypertension 01/06/2022   Acute lower UTI 01/06/2022   Acute kidney injury superimposed on chronic kidney disease ([place]) 01/06/2022   DKA, type 2 ([place]) 01/05/2022   Sinus node dysfunction ([place]) 11/12/2021   Type 2 diabetes mellitus ([place]) 09/02/2021   Mixed hyperlipidemia 08/12/2021   Pacemaker [place] MRI Dual chamber pacemaker 07/24/2021   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 He has not had any falls since last visit.  PERTINENT HISTORY: Low blood pressure Gastroesophageal reflux disease Physical deconditioning Weight loss Insomnia  PAIN:  Are you having pain? No  PRECAUTIONS: None  WEIGHT BEARING RESTRICTIONS No  FALLS:  Has patient fallen in last 6 months? Yes. Number of falls 3   PLOF: Needs assistance with ADLs  PATIENT GOALS Get stronger and walk again.    OBJECTIVE:   MUSCLE LENGTH: Hamstrings: Right 65 deg; Left 65 deg   POSTURE: rounded shoulders and forward head   LOWER EXTREMITY ROM: Grossly WFL except SLR and B DF limited.  LOWER EXTREMITY MMT:  MMT Right eval Left eval  Hip flexion 4- 4-  Hip extension 4- 4-  Hip abduction 4- 4-  Hip adduction    Hip internal rotation    Hip external rotation    Knee flexion 4- 4-  Knee extension 4- 4-  Ankle dorsiflexion 4- 4-  Ankle plantarflexion 4- 4-  Ankle inversion    Ankle eversion     (Blank rows = not tested)    FUNCTIONAL TESTS:  5 times sit to stand: 23.35 Timed up and go (TUG): 21.62 Functional gait assessment: TBD  [facility] PT Assessment - 03/15/22  0001       Functional Gait  Assessment   Gait Level Surface Walks 20 ft, slow speed, abnormal gait pattern, evidence for imbalance or deviates 10-15 in outside of the 12 in walkway width. Requires more than 7 sec to ambulate 20 ft.    Change in Gait Speed Makes only minor adjustments to walking speed, or accomplishes a change in speed with significant gait deviations, deviates 10-15 in outside the 12 in walkway width, or changes speed but loses balance but is able to recover and continue walking.    Gait with Horizontal Head Turns Performs head turns  smoothly with slight change in gait velocity (eg, minor disruption to smooth gait path), deviates 6-10 in outside 12 in walkway width, or uses an assistive device.    Gait with Vertical Head Turns Performs task with slight change in gait velocity (eg, minor disruption to smooth gait path), deviates 6 - 10 in outside 12 in walkway width or uses assistive device    Gait and Pivot Turn Turns slowly, requires verbal cueing, or requires several small steps to catch balance following turn and stop    Step Over Obstacle Is able to step over one shoe box (4.5 in total height) but must slow down and adjust steps to clear box safely. May require verbal cueing.    Gait with Narrow Base of Support Is able to ambulate for 10 steps heel to toe with no staggering.    Gait with Eyes Closed Walks 20 ft, slow speed, abnormal gait pattern, evidence for imbalance, deviates 10-15 in outside 12 in walkway width. Requires more than 9 sec to ambulate 20 ft.    Ambulating Backwards Walks 20 ft, slow speed, abnormal gait pattern, evidence for imbalance, deviates 10-15 in outside 12 in walkway width.    Steps Two feet to a stair, must use rail.    Total Score 14    FGA comment: completed using rolling walker              GAIT: Distance walked: 150',  Assistive device utilized: Walker - 2 wheeled Level of assistance: CGA Comments: Level surfaces. Unsteady, small steps, slow  TODAY'S TREATMENT: 03/15/22: Nustep L3, 5 min UE and LE Completed FGA STS 2x10 Standing marches in walker 2x10, added 2# for second set Seated LAQ w/2# 2x10   Intiail Eval: Performed transfers, bed moiblity re-educaiton and gait training.   PATIENT EDUCATION:  Education details: OC, posture  Person educated: Patient and Spouse Education method: Explanation, Demonstration, Tactile cues, and Verbal cues Education comprehension: verbalized understanding   HOME EXERCISE PROGRAM: TBD  ASSESSMENT:  CLINICAL IMPRESSION: Patient is a  66 y.o. who was seen today for physical therapy treatment for generalized weakness and gait abnormalities. Patient was able to complete FGA and therex activities with CGA and seated rest breaks. Requires verbal cues for posture and form, demonstrates fatigue with STS and standing activities.   OBJECTIVE IMPAIRMENTS Weakness, decreased blaance, unsteadiness on feet, difficulty walking, decreased ROM, decreased flexibility, decreased posture  ACTIVITY LIMITATIONS carrying, lifting, bending, standing, squatting, stairs, transfers, bed mobility, bathing, toileting, dressing, self feeding, reach over head, hygiene/grooming, and locomotion level  PARTICIPATION LIMITATIONS: meal prep, cleaning, driving, shopping, community activity, and yard work  [place], Past/current experiences, and 1 comorbidity: Kidney disease,  are also affecting patient's functional outcome.   REHAB POTENTIAL: Good  CLINICAL DECISION MAKING: Evolving/moderate complexity  EVALUATION COMPLEXITY: Moderate   GOALS: Goals reviewed with patient? Yes  SHORT TERM GOALS:  Target date: 04/06/2022   I with initial HEP Baseline: Goal status: INITIAL  2.  5 x STS in < 15 sec to demonstrate improved balance and strength. Baseline:  Goal status: INITIAL  3.  TUG in <15 sec to demonstrate improved balance. Baseline:  Goal status: INITIAL   LONG TERM GOALS: Target date: 06/01/2022   I with final HEP Baseline:  Goal status: INITIAL   2.  5x STS in < 12 sec. Baseline:  Goal status: INITIAL  3.  TUG in < 12 sec Baseline:  Goal status: INITIAL  4.  FGA of at least 24/30 Baseline:  Goal status: INITIAL  5.  Ambulate at least 400' with LRAD, S Baseline:  Goal status: INITIAL   PLAN: PT FREQUENCY: 2x/week  PT DURATION: 12 weeks  PLANNED INTERVENTIONS: Therapeutic exercises, Therapeutic activity, Neuromuscular re-education, Balance training, Gait training, Patient/Family education, and Manual  therapy  PLAN FOR NEXT SESSION: Initiate HEP for strength and balance, continue with gait training and LE strengthening   Andris Baumann, PT 03/15/2022, 1:47 PM

## 2022-03-14 NOTE — Progress Notes (Signed)
   Complete physical exam  Patient: John Moyer   DOB: 08/06/1997   66 y.o. Male  MRN: [id number]  Subjective:    No chief complaint on file.   John Moyer is a 66 y.o. male who presents today for a complete physical exam. She reports consuming a {diet types:17450} diet. {types:19826} She generally feels {DESC; WELL/FAIRLY WELL/POORLY:18703}. She reports sleeping {DESC; WELL/FAIRLY WELL/POORLY:18703}. She {does/does not:200015} have additional problems to discuss today.    Most recent fall risk assessment:    04/13/2022   10:42 AM  Fall Risk   Falls in the past year? 0  Number falls in past yr: 0  Injury with Fall? 0  Risk for fall due to : No Fall Risks  Follow up Falls evaluation completed     Most recent depression screenings:    04/13/2022   10:42 AM 03/04/2021   10:46 AM  PHQ 2/9 Scores  PHQ - 2 Score 0 0  PHQ- 9 Score 5     {VISON DENTAL STD PSA (Optional):27386}  {History (Optional):23778}  Patient Care Team: Jessup, Joy, NP as PCP - General (Nurse Practitioner)   Outpatient Medications Prior to Visit  Medication Sig   fluticasone (FLONASE) 50 MCG/ACT nasal spray Place 2 sprays into both nostrils in the morning and at bedtime. After 7 days, reduce to once daily.   norgestimate-ethinyl estradiol (SPRINTEC 28) 0.25-35 MG-MCG tablet Take 1 tablet by mouth daily.   Nystatin POWD Apply liberally to affected area 2 times per day   spironolactone (ALDACTONE) 100 MG tablet Take 1 tablet (100 mg total) by mouth daily.   No facility-administered medications prior to visit.    ROS        Objective:     There were no vitals taken for this visit. {Vitals History (Optional):23777}  Physical Exam   No results found for any visits on 05/19/22. {Show previous labs (optional):23779}    Assessment & Plan:    Routine Health Maintenance and Physical Exam  Immunization History  Administered Date(s) Administered   DTaP 10/20/1999, 12/16/1999,  02/24/2000, 11/09/2000, 05/25/2004   Hepatitis A 03/21/2008, 03/27/2009   Hepatitis B 08/07/1999, 09/14/1999, 02/24/2000   HiB (PRP-OMP) 10/20/1999, 12/16/1999, 02/24/2000, 11/09/2000   IPV 10/20/1999, 12/16/1999, 08/14/2000, 05/25/2004   Influenza,inj,Quad PF,6+ Mos 06/27/2014   Influenza-Unspecified 09/26/2012   MMR 08/14/2001, 05/25/2004   Meningococcal Polysaccharide 03/26/2012   Pneumococcal Conjugate-13 11/09/2000   Pneumococcal-Unspecified 02/24/2000, 05/09/2000   Tdap 03/26/2012   Varicella 08/14/2000, 03/21/2008    Health Maintenance  Topic Date Due   HIV Screening  Never done   Hepatitis C Screening  Never done   INFLUENZA VACCINE  05/17/2022   PAP-Cervical Cytology Screening  05/19/2022 (Originally 08/05/2020)   PAP SMEAR-Modifier  05/19/2022 (Originally 08/05/2020)   TETANUS/TDAP  05/19/2022 (Originally 03/26/2022)   HPV VACCINES  Discontinued   COVID-19 Vaccine  Discontinued    Discussed health benefits of physical activity, and encouraged her to engage in regular exercise appropriate for her age and condition.  Problem List Items Addressed This Visit   None Visit Diagnoses     Annual physical exam    -  Primary   Cervical cancer screening       Need for Tdap vaccination          No follow-ups on file.     Joy Jessup, NP   

## 2022-03-15 ENCOUNTER — Ambulatory Visit: Payer: 59

## 2022-03-15 VITALS — BP 91/67

## 2022-03-15 DIAGNOSIS — M6281 Muscle weakness (generalized): Secondary | ICD-10-CM

## 2022-03-15 DIAGNOSIS — R262 Difficulty in walking, not elsewhere classified: Secondary | ICD-10-CM

## 2022-03-15 DIAGNOSIS — R2681 Unsteadiness on feet: Secondary | ICD-10-CM

## 2022-03-15 DIAGNOSIS — R2689 Other abnormalities of gait and mobility: Secondary | ICD-10-CM

## 2022-03-17 ENCOUNTER — Ambulatory Visit: Payer: 59 | Attending: Internal Medicine

## 2022-03-17 DIAGNOSIS — R262 Difficulty in walking, not elsewhere classified: Secondary | ICD-10-CM | POA: Diagnosis present

## 2022-03-17 DIAGNOSIS — M6281 Muscle weakness (generalized): Secondary | ICD-10-CM | POA: Insufficient documentation

## 2022-03-17 DIAGNOSIS — R2689 Other abnormalities of gait and mobility: Secondary | ICD-10-CM | POA: Insufficient documentation

## 2022-03-17 DIAGNOSIS — Z9181 History of falling: Secondary | ICD-10-CM | POA: Diagnosis present

## 2022-03-17 DIAGNOSIS — R2681 Unsteadiness on feet: Secondary | ICD-10-CM | POA: Diagnosis present

## 2022-03-17 NOTE — Therapy (Signed)
OUTPATIENT PHYSICAL THERAPY LOWER EXTREMITY TREATMENT   Patient Name: John Moyer MRN: [id number] DOB:[birth year], 66 y.o., male Today's Date: 03/17/2022   PT End of Session - 03/17/22 1235     Visit Number 3    Date for PT Re-Evaluation 06/01/22    Progress Note Due on Visit 10    PT Start Time 7782    PT Stop Time 1315    PT Time Calculation (min) 45 min    Equipment Utilized During Treatment Gait belt    Activity Tolerance Patient tolerated treatment well;Patient limited by fatigue    Behavior During Therapy [facility] for tasks assessed/performed               Past Medical History:  Diagnosis Date   CAD (coronary artery disease)    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[place]) 07/24/2021   Pacemaker [place] MRI Dual chamber pacemaker 07/24/2021   Stroke ([place])    Type 2 diabetes mellitus ([place])    Past Surgical History:  Procedure Laterality Date   COLONOSCOPY WITH PROPOFOL N/A 11/26/2021   Procedure: COLONOSCOPY WITH PROPOFOL;  Surgeon: Carol Ada, MD;  [facility];  Service: Endoscopy;  Laterality: N/A;   CORONARY ARTERY BYPASS GRAFT     POLYPECTOMY  11/26/2021   Procedure: POLYPECTOMY;  Surgeon: Carol Ada, MD;  [facility];  Service: Endoscopy;;   Patient Active Problem List   Diagnosis Date Noted   Dyslipidemia 01/06/2022   Essential hypertension 01/06/2022   Acute lower UTI 01/06/2022   Acute kidney injury superimposed on chronic kidney disease ([place]) 01/06/2022   DKA, type 2 ([place]) 01/05/2022   Sinus node dysfunction ([place]) 11/12/2021   Type 2 diabetes mellitus ([place]) 09/02/2021   Mixed hyperlipidemia 08/12/2021   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 He reports he is feeling more tired because he has been walking more than usual.   PERTINENT HISTORY: Low blood pressure Gastroesophageal reflux disease Physical deconditioning Weight loss Insomnia  PAIN:  Are you having pain? No  PRECAUTIONS: None  WEIGHT BEARING RESTRICTIONS No  FALLS:  Has patient fallen in last 6 months? Yes. Number of falls 3   PLOF: Needs assistance with ADLs  PATIENT GOALS Get stronger and walk again.    OBJECTIVE:   MUSCLE LENGTH: Hamstrings: Right 65 deg; Left 65 deg   POSTURE: rounded shoulders and forward head   LOWER EXTREMITY ROM: Grossly WFL except SLR and B DF limited.  LOWER EXTREMITY MMT:  MMT Right eval Left eval  Hip flexion 4- 4-  Hip extension 4- 4-  Hip abduction 4- 4-  Hip adduction    Hip internal rotation    Hip external rotation    Knee flexion 4- 4-  Knee extension 4- 4-  Ankle dorsiflexion 4- 4-  Ankle plantarflexion 4- 4-  Ankle inversion    Ankle eversion     (Blank rows = not tested)    FUNCTIONAL TESTS:  5 times sit to stand: 23.35 Timed  up and go (TUG): 21.62 Functional gait assessment: TBD     GAIT: Distance walked: 150',  Assistive device utilized: Walker - 2 wheeled Level of assistance: CGA Comments: Level surfaces. Unsteady, small steps, slow  TODAY'S TREATMENT: 03/17/22: Walking 1 lap around building with Rollator and CGA STS 2x10  2 way hip abd and ext w/2# 2x10 Standing marches w/2# 20reps Calf raises w/2# 2x12   03/15/22: Nustep L3, 5 min UE and LE Completed FGA STS 2x10 Standing marches in walker 2x10, added 2# for second set Seated LAQ w/2# 2x10   Intiail Eval: Performed transfers, bed moiblity  re-educaiton and gait training.   PATIENT EDUCATION:  Education details: OC, posture  Person educated: Patient and Spouse Education method: Explanation, Demonstration, Tactile cues, and Verbal cues Education comprehension: verbalized understanding   HOME EXERCISE PROGRAM: TBD  ASSESSMENT:  CLINICAL IMPRESSION: Patient return to therapy to address generalized weakness and gait abnormalities. Patient is walking with a rollator today, as compared to previous visits in wheelchair. He needed extra rest breaks today due to increased fatigue. Needs to hand support for standing exercises and requires verbal cues for posture and form.   OBJECTIVE IMPAIRMENTS Weakness, decreased blaance, unsteadiness on feet, difficulty walking, decreased ROM, decreased flexibility, decreased posture  ACTIVITY LIMITATIONS carrying, lifting, bending, standing, squatting, stairs, transfers, bed mobility, bathing, toileting, dressing, self feeding, reach over head, hygiene/grooming, and locomotion level  PARTICIPATION LIMITATIONS: meal prep, cleaning, driving, shopping, community activity, and yard work  [place], Past/current experiences, and 1 comorbidity: Kidney disease,  are also affecting patient's functional outcome.   REHAB POTENTIAL: Good  CLINICAL DECISION MAKING: Evolving/moderate complexity  EVALUATION COMPLEXITY: Moderate   GOALS: Goals reviewed with patient? Yes  SHORT TERM GOALS: Target date: 04/06/2022   I with initial HEP Baseline: Goal status: INITIAL  2.  5 x STS in < 15 sec to demonstrate improved balance and strength. Baseline:  Goal status: INITIAL  3.  TUG in <15 sec to demonstrate improved balance. Baseline:  Goal status: INITIAL   LONG TERM GOALS: Target date: 06/01/2022   I with final HEP Baseline:  Goal status: INITIAL   2.  5x STS in < 12 sec. Baseline:  Goal status: INITIAL  3.  TUG in < 12 sec Baseline:  Goal status: INITIAL  4.  FGA of  at least 24/30 Baseline:  Goal status: INITIAL  5.  Ambulate at least 400' with LRAD, S Baseline:  Goal status: INITIAL   PLAN: PT FREQUENCY: 2x/week  PT DURATION: 12 weeks  PLANNED INTERVENTIONS: Therapeutic exercises, Therapeutic activity, Neuromuscular re-education, Balance training, Gait training, Patient/Family education, and Manual therapy  PLAN FOR                     NEXT SESSION: Initiate HEP for strength and balance, continue with gait training and LE strengthening    Andris Baumann, PT 03/17/2022, 1:22 PM

## 2022-03-22 ENCOUNTER — Ambulatory Visit: Payer: 59

## 2022-03-22 DIAGNOSIS — M6281 Muscle weakness (generalized): Secondary | ICD-10-CM

## 2022-03-22 DIAGNOSIS — R262 Difficulty in walking, not elsewhere classified: Secondary | ICD-10-CM

## 2022-03-22 DIAGNOSIS — R2689 Other abnormalities of gait and mobility: Secondary | ICD-10-CM

## 2022-03-22 DIAGNOSIS — R2681 Unsteadiness on feet: Secondary | ICD-10-CM

## 2022-03-22 DIAGNOSIS — Z9181 History of falling: Secondary | ICD-10-CM

## 2022-03-22 NOTE — Therapy (Signed)
OUTPATIENT PHYSICAL THERAPY LOWER EXTREMITY TREATMENT   Patient Name: John Moyer MRN: [id number] DOB:11/04/1955, 66 y.o., male Today's Date: 03/22/2022   PT End of Session - 03/22/22 1323     Visit Number 4    Progress Note Due on Visit 10    PT Start Time 1321    PT Stop Time 1401    PT Time Calculation (min) 40 min    Equipment Utilized During Treatment Gait belt    Activity Tolerance Patient tolerated treatment well;Patient limited by fatigue    Behavior During Therapy [facility] for tasks assessed/performed                Past Medical History:  Diagnosis Date   CAD (coronary artery disease)    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 07/24/2021   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[place]) 07/24/2021   Pacemaker [place] MRI Dual chamber pacemaker 07/24/2021   Stroke ([place])    Type 2 diabetes mellitus ([place])    Past Surgical History:  Procedure Laterality Date   COLONOSCOPY WITH PROPOFOL N/A 11/26/2021   Procedure: COLONOSCOPY WITH PROPOFOL;  Surgeon: Carol Ada, MD;  Location: [facility];  Service: Endoscopy;  Laterality: N/A;   CORONARY ARTERY BYPASS GRAFT     POLYPECTOMY  11/26/2021   Procedure: POLYPECTOMY;  Surgeon: Carol Ada, MD;  Location: [facility];  Service: Endoscopy;;   Patient Active Problem List   Diagnosis Date Noted   Dyslipidemia 01/06/2022   Essential hypertension 01/06/2022   Acute lower UTI 01/06/2022   Acute kidney injury superimposed on chronic kidney disease ([place]) 01/06/2022   DKA, type 2 ([place]) 01/05/2022   Sinus node dysfunction ([place]) 11/12/2021   Type 2 diabetes mellitus ([place]) 09/02/2021   Mixed hyperlipidemia 08/12/2021   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 Reports no pain   PERTINENT HISTORY: Low blood pressure Gastroesophageal reflux disease Physical deconditioning Weight loss Insomnia  PAIN:  Are you having pain? No  PRECAUTIONS: None  WEIGHT BEARING RESTRICTIONS No  FALLS:  Has patient fallen in last 6 months? Yes. Number of falls 3   PLOF: Needs assistance with ADLs  PATIENT GOALS Get stronger and walk again.    OBJECTIVE:   MUSCLE LENGTH: Hamstrings: Right 65 deg; Left 65 deg   POSTURE: rounded shoulders and forward head   LOWER EXTREMITY ROM: Grossly WFL except SLR and B DF limited.  LOWER EXTREMITY MMT:  MMT Right eval Left eval  Hip flexion 4- 4-  Hip extension 4- 4-  Hip abduction 4- 4-  Hip adduction    Hip internal rotation    Hip external rotation    Knee flexion 4- 4-  Knee extension 4- 4-  Ankle dorsiflexion 4- 4-  Ankle plantarflexion 4- 4-  Ankle inversion    Ankle eversion     (Blank rows = not tested)    FUNCTIONAL TESTS:  5 times sit to stand: 23.35 Timed up and go (TUG): 21.62 Functional  gait assessment: TBD     GAIT: Distance walked: 150',  Assistive device utilized: Walker - 2 wheeled Level of assistance: CGA Comments: Level surfaces. Unsteady, small steps, slow  TODAY'S TREATMENT: 03/22/22 Nustep L5, 5 mins Forward step ups 4inch 2x10 Standing ABD 2# Standing hip flexion 2# 20reps Calf raises 2# 2x10  STS x10 Walking 3 laps ~334f w/CGA   PATIENT EDUCATION:  Education details: OC, posture  Person educated: Patient and Spouse Education method: Explanation, Demonstration, Tactile cues, and Verbal cues Education comprehension: verbalized  understanding   HOME EXERCISE PROGRAM: TBD  ASSESSMENT:  CLINICAL IMPRESSION: Patient continues to therapy to address generalized weakness and gait abnormalities. Patient is still using rollator to get around, but requires rest breaks due to fatigue. Therapy session focused on LE strengthening and endurance. He is showing good improvements with gait and being able to walk a longer distance today without a rest break.   OBJECTIVE IMPAIRMENTS Weakness, decreased blaance, unsteadiness on feet, difficulty walking, decreased ROM, decreased flexibility, decreased posture  ACTIVITY LIMITATIONS carrying, lifting, bending, standing, squatting, stairs, transfers, bed mobility, bathing, toileting, dressing, self feeding, reach over head, hygiene/grooming, and locomotion level  PARTICIPATION LIMITATIONS: meal prep, cleaning, driving, shopping, community activity, and yard work  [place] Past/current experiences, and 1 comorbidity: Kidney disease,  are also affecting patient's functional outcome.   REHAB POTENTIAL: Good  CLINICAL DECISION MAKING: Evolving/moderate complexity  EVALUATION COMPLEXITY: Moderate   GOALS: Goals reviewed with patient? Yes  SHORT TERM GOALS: Target date: 04/06/2022   I with initial HEP Baseline: Goal status: INITIAL  2.  5 x STS in < 15 sec to demonstrate improved balance and strength. Baseline:  Goal status: INITIAL  3.  TUG in <15 sec to demonstrate improved balance. Baseline:  Goal status: INITIAL   LONG TERM GOALS: Target date: 06/01/2022   I with final HEP Baseline:  Goal status: INITIAL   2.  5x STS in < 12 sec. Baseline:  Goal status: INITIAL  3.  TUG in < 12 sec Baseline:  Goal status: INITIAL  4.  FGA of at least 24/30 Baseline:  Goal status: INITIAL  5.  Ambulate at least 400' with LRAD, S Baseline:  Goal status: INITIAL   PLAN: PT FREQUENCY: 2x/week  PT DURATION: 12 weeks  PLANNED INTERVENTIONS:  Therapeutic exercises, Therapeutic activity, Neuromuscular re-education, Balance training, Gait training, Patient/Family education, and Manual therapy  PLAN FOR                     NEXT SESSION: continue with gait training and LE strengthening    MAndris Baumann PT 03/22/2022, 2:04 PM

## 2022-03-24 ENCOUNTER — Encounter: Payer: Self-pay | Admitting: Physical Therapy

## 2022-03-24 ENCOUNTER — Ambulatory Visit: Payer: 59 | Admitting: Physical Therapy

## 2022-03-24 DIAGNOSIS — Z9181 History of falling: Secondary | ICD-10-CM

## 2022-03-24 DIAGNOSIS — M6281 Muscle weakness (generalized): Secondary | ICD-10-CM

## 2022-03-24 DIAGNOSIS — R2689 Other abnormalities of gait and mobility: Secondary | ICD-10-CM

## 2022-03-24 DIAGNOSIS — R262 Difficulty in walking, not elsewhere classified: Secondary | ICD-10-CM

## 2022-03-24 NOTE — Therapy (Signed)
OUTPATIENT PHYSICAL THERAPY LOWER EXTREMITY TREATMENT   Patient Name: John Moyer MRN: [id number] DOB:May 24, 1956, 66 y.o., male Today's Date: 03/24/2022   PT End of Session - 03/24/22 1351     Visit Number 5    Date for PT Re-Evaluation 06/01/22    PT Start Time 1345    PT Stop Time 1430    PT Time Calculation (min) 45 min    Activity Tolerance Patient tolerated treatment well;Patient limited by fatigue    Behavior During Therapy [facility] for tasks assessed/performed                Past Medical History:  Diagnosis Date   CAD (coronary artery disease)    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 07/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[place]) 07/24/2021   Pacemaker [place] MRI Dual chamber pacemaker 07/24/2021   Stroke ([place])    Type 2 diabetes mellitus ([place])    Past Surgical History:  Procedure Laterality Date   COLONOSCOPY WITH PROPOFOL N/A 11/26/2021   Procedure: COLONOSCOPY WITH PROPOFOL;  Surgeon: Carol Ada, MD;  [facility];  Service: Endoscopy;  Laterality: N/A;   CORONARY ARTERY BYPASS GRAFT     POLYPECTOMY  11/26/2021   Procedure: POLYPECTOMY;  Surgeon: Carol Ada, MD;  [facility];  Service: Endoscopy;;   Patient Active Problem List   Diagnosis Date Noted   Dyslipidemia 01/06/2022   Essential hypertension 01/06/2022   Acute lower UTI 01/06/2022   Acute kidney injury superimposed on chronic kidney disease ([place]) 01/06/2022   DKA, type 2 ([place]) 01/05/2022   Sinus node dysfunction ([place]) 11/12/2021   Type 2 diabetes mellitus ([place]) 09/02/2021   Mixed hyperlipidemia 08/12/2021   Pacemaker [employer] [place] MRI Dual chamber pacemaker 07/24/2021   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 No  PRECAUTIONS: None  WEIGHT BEARING RESTRICTIONS No  FALLS:  Has patient fallen in last 6 months? Yes. Number of falls 3   PLOF: Needs assistance with ADLs  PATIENT GOALS Get stronger and walk again.    OBJECTIVE:   MUSCLE LENGTH: Eval Hamstrings: Right 65 deg; Left 65 deg   POSTURE: rounded shoulders and forward head   LOWER EXTREMITY ROM: Grossly WFL except SLR and B DF limited.  LOWER EXTREMITY MMT:  MMT Right eval Left eval  Hip flexion 4- 4-  Hip extension 4- 4-  Hip abduction 4- 4-  Hip adduction    Hip internal rotation    Hip external rotation    Knee flexion 4- 4-  Knee extension 4- 4-  Ankle dorsiflexion 4- 4-  Ankle plantarflexion 4- 4-  Ankle inversion    Ankle eversion     (Blank rows = not tested)    FUNCTIONAL TESTS:   Eval 5 times sit to stand: 23.35 Timed up and go (TUG): 21.62 Functional gait assessment: TBD     GAIT:  Eval Distance walked: 150',  Assistive device utilized: Walker - 2 wheeled Level of assistance: CGA Comments: Level surfaces. Unsteady,  small steps, slow  TODAY'S TREATMENT: 03/24/2022 NuStep L5 x 7 min  S2S x10 LE pushes against table Standing march with rollator 2x10  Leg press 30lb 2x10 Hamstring curls 20lb 2x12  Leg Ext 5lb 2x12 Gait 361f with rollator  Standing reaching outside BOS     03/22/22 Nustep L5, 5 mins Forward step ups 4inch 2x10 Standing ABD 2# Standing hip flexion 2# 20reps Calf raises 2# 2x10  STS x10 Walking 3 laps ~3067fw/CGA   PATIENT EDUCATION:  Education details: OC, posture  Person educated: Patient and Spouse Education method: Explanation, Demonstration, Tactile  cues, and Verbal cues Education comprehension: verbalized understanding   HOME EXERCISE PROGRAM: TBD  ASSESSMENT:  CLINICAL IMPRESSION: Pt enters feeling well. Session consisted of a continuation of functional strength. Pt unable to complete sit to stands without LE pushing against table. Pt has increase fatigue throughout session requiting rest. Cue to complete full ROM on all machine level interventions. Good effort given throughout session. Instability reaching outside base of support     OBJECTIVE IMPAIRMENTS Weakness, decreased blaance, unsteadiness on feet, difficulty walking, decreased ROM, decreased flexibility, decreased posture  ACTIVITY LIMITATIONS carrying, lifting, bending, standing, squatting, stairs, transfers, bed mobility, bathing, toileting, dressing, self feeding, reach over head, hygiene/grooming, and locomotion level  PARTICIPATION LIMITATIONS: meal prep, cleaning, driving, shopping, community activity, and yard work  [place]/current experiences, and 1 comorbidity: Kidney disease,  are also affecting patient's functional outcome.   REHAB POTENTIAL: Good  CLINICAL DECISION MAKING: Evolving/moderate complexity  EVALUATION COMPLEXITY: Moderate   GOALS: Goals reviewed with patient? Yes  SHORT TERM GOALS: Target date: 04/06/2022   I with initial HEP Baseline: Goal status: INITIAL  2.  5 x STS in < 15 sec to demonstrate improved balance and strength. Baseline:  Goal status: INITIAL  3.  TUG in <15 sec to demonstrate improved balance. Baseline:  Goal status: INITIAL   LONG TERM GOALS: Target date: 06/01/2022   I with final HEP Baseline:  Goal status: INITIAL   2.  5x STS in < 12 sec. Baseline:  Goal status: INITIAL  3.  TUG in < 12 sec Baseline:  Goal status: INITIAL  4.  FGA of at least 24/30 Baseline:  Goal status: INITIAL  5.  Ambulate at least 400' with LRAD, S Baseline:  Goal status: INITIAL   PLAN: PT  FREQUENCY: 2x/week  PT DURATION: 12 weeks  PLANNED INTERVENTIONS: Therapeutic exercises, Therapeutic activity, Neuromuscular re-education, Balance training, Gait training, Patient/Family education, and Manual therapy  PLAN FOR                     NEXT SESSION: continue with gait training and LE strengthening    [place] JunPTA 03/24/2022, 1:52 PM

## 2022-03-29 ENCOUNTER — Ambulatory Visit: Payer: 59

## 2022-03-29 DIAGNOSIS — Z9181 History of falling: Secondary | ICD-10-CM

## 2022-03-29 DIAGNOSIS — M6281 Muscle weakness (generalized): Secondary | ICD-10-CM | POA: Diagnosis not present

## 2022-03-29 DIAGNOSIS — R2681 Unsteadiness on feet: Secondary | ICD-10-CM

## 2022-03-29 DIAGNOSIS — R2689 Other abnormalities of gait and mobility: Secondary | ICD-10-CM

## 2022-03-29 DIAGNOSIS — R262 Difficulty in walking, not elsewhere classified: Secondary | ICD-10-CM

## 2022-03-29 NOTE — Therapy (Signed)
OUTPATIENT PHYSICAL THERAPY LOWER EXTREMITY TREATMENT   Patient Name: John Moyer MRN: [id number] DOB:07-Sep-1956, 66 y.o., male Today's Date: 03/29/2022   PT End of Session - 03/29/22 1324     Visit Number 6    Date for PT Re-Evaluation 06/01/22    PT Start Time 1315    PT Stop Time 1355    PT Time Calculation (min) 40 min    Equipment Utilized During Treatment Gait belt    Activity Tolerance Patient tolerated treatment well;Patient limited by fatigue    Behavior During Therapy [facility] for tasks assessed/performed                 Past Medical History:  Diagnosis Date   CAD (coronary artery disease)    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 07/24/2021   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[place]) 07/24/2021   Pacemaker [place] MRI Dual chamber pacemaker 07/24/2021   Stroke ([place])    Type 2 diabetes mellitus ([place])    Past Surgical History:  Procedure Laterality Date   COLONOSCOPY WITH PROPOFOL N/A 11/26/2021   Procedure: COLONOSCOPY WITH PROPOFOL;  Surgeon: Carol Ada, MD;  [facility];  Service: Endoscopy;  Laterality: N/A;   CORONARY ARTERY BYPASS GRAFT     POLYPECTOMY  11/26/2021   Procedure: POLYPECTOMY;  Surgeon: Carol Ada, MD;  [facility];  Service: Endoscopy;;   Patient Active Problem List   Diagnosis Date Noted   Dyslipidemia 01/06/2022   Essential hypertension 01/06/2022   Acute lower UTI 01/06/2022   Acute kidney injury superimposed on chronic kidney disease ([place]) 01/06/2022   DKA, type 2 ([place]) 01/05/2022   Sinus node dysfunction ([place]) 11/12/2021   Type 2 diabetes mellitus ([place]) 09/02/2021   Mixed hyperlipidemia 08/12/2021   Pacemaker [place] MRI Dual chamber pacemaker 07/24/2021   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 Not doing much exercise at home but is practicing some sit to stands.   PERTINENT HISTORY: Low blood pressure Gastroesophageal reflux disease Physical deconditioning Weight loss Insomnia  PAIN:  Are you having pain? No  PRECAUTIONS: None  WEIGHT BEARING RESTRICTIONS No  FALLS:  Has patient fallen in last 6 months? Yes. Number of falls 3   PLOF: Needs assistance with ADLs  PATIENT GOALS Get stronger and walk again.    OBJECTIVE:   MUSCLE LENGTH: Eval Hamstrings: Right 65 deg; Left 65 deg   POSTURE: rounded shoulders and forward head   LOWER EXTREMITY ROM: Grossly WFL except SLR and B DF limited.  LOWER EXTREMITY MMT:  MMT Right eval Left eval  Hip flexion 4- 4-  Hip extension 4- 4-  Hip abduction 4- 4-  Hip adduction    Hip internal rotation    Hip external rotation    Knee flexion 4- 4-  Knee extension 4- 4-  Ankle dorsiflexion 4- 4-  Ankle plantarflexion 4- 4-  Ankle inversion    Ankle eversion     (Blank rows = not tested)    FUNCTIONAL TESTS:   Eval 5 times sit to stand: 23.35 Timed  up and go (TUG): 21.62 Functional gait assessment: TBD     GAIT:  Eval Distance walked: 150',  Assistive device utilized: Walker - 2 wheeled Level of assistance: CGA Comments: Level surfaces. Unsteady, small steps, slow  TODAY'S TREATMENT: 03/29/22 Walking outside small loop  Walking in parallel bars without HHA, forwards, backwards, side steps CGA Minisquats with ball OHP 2x7  Step ups forwards 20 reps with 2HHA on stairs Seated hamstring stretch 30sec BLE Cybex 15# hamstring 2x10  03/24/2022 NuStep L5 x 7 min  S2S x10 LE pushes against table Standing march with rollator  2x10  Leg press 30lb 2x10 Hamstring curls 20lb 2x12  Leg Ext 5lb 2x12 Gait 317f with rollator  Standing reaching outside BOS     PATIENT EDUCATION:  Education details: OC, posture  Person educated: Patient and Spouse Education method: Explanation, Demonstration, Tactile cues, and Verbal cues Education comprehension: verbalized understanding   HOME EXERCISE PROGRAM: TBD  ASSESSMENT:  CLINICAL IMPRESSION: Pt enters feeling well, continued with functional strength training. Halfway through session complains of low back pain that limits him today. Pt has increase fatigue throughout session but gives a good effort to complete activities. Progress next visit as tolerated.   OBJECTIVE IMPAIRMENTS Weakness, decreased blaance, unsteadiness on feet, difficulty walking, decreased ROM, decreased flexibility, decreased posture  ACTIVITY LIMITATIONS carrying, lifting, bending, standing, squatting, stairs, transfers, bed mobility, bathing, toileting, dressing, self feeding, reach over head, hygiene/grooming, and locomotion level  PARTICIPATION LIMITATIONS: meal prep, cleaning, driving, shopping, community activity, and yard work  [place] Past/current experiences, and 1 comorbidity: Kidney disease,  are also affecting patient's functional outcome.   REHAB POTENTIAL: Good  CLINICAL DECISION MAKING: Evolving/moderate complexity  EVALUATION COMPLEXITY: Moderate   GOALS: Goals reviewed with patient? Yes  SHORT TERM GOALS: Target date: 04/06/2022   I with initial HEP Baseline: Goal status: INITIAL  2.  5 x STS in < 15 sec to demonstrate improved balance and strength. Baseline:  Goal status: INITIAL  3.  TUG in <15 sec to demonstrate improved balance. Baseline:  Goal status: INITIAL   LONG TERM GOALS: Target date: 06/01/2022   I with final HEP Baseline:  Goal status: INITIAL   2.  5x STS in < 12 sec. Baseline:  Goal status: INITIAL  3.  TUG in < 12  sec Baseline:  Goal status: INITIAL  4.  FGA of at least 24/30 Baseline:  Goal status: INITIAL  5.  Ambulate at least 400' with LRAD, S Baseline:  Goal status: INITIAL   PLAN: PT FREQUENCY: 2x/week  PT DURATION: 12 weeks  PLANNED INTERVENTIONS: Therapeutic exercises, Therapeutic activity, Neuromuscular re-education, Balance training, Gait training, Patient/Family education, and Manual therapy  PLAN FOR                     NEXT SESSION: continue with gait training and LE strengthening    MAndris Baumann PT 03/29/2022, 1:56 PM

## 2022-03-31 ENCOUNTER — Encounter: Payer: Self-pay | Admitting: Physical Therapy

## 2022-03-31 ENCOUNTER — Ambulatory Visit: Payer: 59 | Admitting: Physical Therapy

## 2022-03-31 DIAGNOSIS — M6281 Muscle weakness (generalized): Secondary | ICD-10-CM

## 2022-03-31 DIAGNOSIS — R262 Difficulty in walking, not elsewhere classified: Secondary | ICD-10-CM

## 2022-03-31 DIAGNOSIS — R2681 Unsteadiness on feet: Secondary | ICD-10-CM

## 2022-03-31 DIAGNOSIS — Z9181 History of falling: Secondary | ICD-10-CM

## 2022-03-31 NOTE — Therapy (Signed)
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 07/24/2021   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[place]) 07/24/2021   Pacemaker [place] MRI Dual chamber pacemaker 07/24/2021   Stroke ([place])    Type 2 diabetes mellitus ([place])    Past Surgical History:  Procedure Laterality Date   COLONOSCOPY WITH PROPOFOL N/A 11/26/2021   Procedure: COLONOSCOPY WITH PROPOFOL;  Surgeon: Carol Ada, MD;  Location[facility];  Service: Endoscopy;  Laterality: N/A;   CORONARY ARTERY BYPASS GRAFT     POLYPECTOMY  11/26/2021   Procedure: POLYPECTOMY;  Surgeon: Carol Ada, MD;  Location: [facility];  Service: Endoscopy;;   Patient Active Problem List   Diagnosis Date Noted   Dyslipidemia 01/06/2022   Essential hypertension 01/06/2022   Acute lower UTI 01/06/2022   Acute kidney injury superimposed on chronic kidney disease ([place]) 01/06/2022   DKA, type 2 ([place]) 01/05/2022   Sinus node dysfunction ([place]) 11/12/2021   Type 2 diabetes mellitus ([place]) 09/02/2021   Mixed hyperlipidemia 08/12/2021   Pacemaker [employer] Acc[place] MRI Dual chamber pacemaker 07/24/2021   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   PERTINENT HISTORY: Low blood pressure Gastroesophageal reflux disease Physical deconditioning Weight loss Insomnia  PAIN:  Are you having pain? No  PRECAUTIONS: None  WEIGHT BEARING RESTRICTIONS No  FALLS:  Has patient fallen in last 6 months? Yes. Number of falls 3   PLOF: Needs assistance with ADLs  PATIENT GOALS Get stronger and walk again.    OBJECTIVE:   MUSCLE LENGTH: Eval Hamstrings: Right 65 deg; Left 65 deg   POSTURE: rounded shoulders and forward head   LOWER EXTREMITY ROM: Grossly WFL except SLR and B DF limited.  LOWER EXTREMITY MMT:  MMT Right eval Left eval  Hip flexion 4- 4-  Hip extension 4- 4-  Hip abduction 4- 4-  Hip adduction    Hip internal rotation    Hip external rotation    Knee flexion 4- 4-  Knee extension 4- 4-  Ankle dorsiflexion 4- 4-  Ankle plantarflexion 4- 4-  Ankle inversion    Ankle eversion     (Blank rows = not tested)    FUNCTIONAL TESTS:   Eval 5 times sit to stand: 23.35 Timed up and go (TUG): 21.62 Functional gait assessment: TBD     GAIT:  Eval Distance walked: 150',  Assistive device utilized: Walker - 2 wheeled Level of assistance: CGA Comments: Level  surfaces. Unsteady, small steps, slow  TODAY'S TREATMENT: 03/31/22 NuStep L5 x6 min . 20lb $ way resisted gait x3 each  Rows green standing 2x10 Ext standing shoulder 2x10  Leg curls 20lb 2x12 Leg Ext 10lb 2x12  03/29/22 Walking outside small loop  Walking in parallel bars without HHA, forwards, backwards, side steps CGA Minisquats with ball OHP 2x7  Step ups forwards 20 reps with 2HHA on stairs Seated hamstring stretch 30sec BLE Cybex 15# hamstring 2x10  03/24/2022 NuStep L5 x 7 min  S2S x10 LE pushes against table Standing march with rollator 2x10  Leg press 30lb  2x10 Hamstring curls 20lb 2x12  Leg Ext 5lb 2x12 Gait 317ft with rollator  Standing reaching outside BOS     PATIENT EDUCATION:  Education details: OC, posture  Person educated: Patient and Spouse Education method: Explanation, Demonstration, Tactile cues, and Verbal cues Education comprehension: verbalized understanding   HOME EXERCISE PROGRAM: TBD  ASSESSMENT:  CLINICAL IMPRESSION: Pt enters feeling well with no pain. He has progressed meeting LTG's  with 5 times sit to stand a TUG. Increase time needed with resisted gait stopping to rest between each change of direction. Some instability with resisted side steps. Cue for eccentric control needed with leg extensions.  OBJECTIVE IMPAIRMENTS Weakness, decreased blaance, unsteadiness on feet, difficulty walking, decreased ROM, decreased flexibility, decreased posture  ACTIVITY LIMITATIONS carrying, lifting, bending, standing, squatting, stairs, transfers, bed mobility, bathing, toileting, dressing, self feeding, reach over head, hygiene/grooming, and locomotion level  PARTICIPATION LIMITATIONS: meal prep, cleaning, driving, shopping, community activity, and yard work  [place], Past/current experiences, and 1 comorbidity: Kidney disease,  are also affecting patient's functional outcome.   REHAB POTENTIAL: Good  CLINICAL DECISION MAKING: Evolving/moderate complexity  EVALUATION COMPLEXITY: Moderate   GOALS: Goals reviewed with patient? Yes  SHORT TERM GOALS: Target date: 04/06/2022   I with initial HEP Baseline: Goal status: Met  2.  5 x STS in < 15 sec to demonstrate improved balance and strength. Baseline:  03/31/22 15.07 sec  Goal status: Progressing   3.  TUG in <15 sec to demonstrate improved balance. Baseline: 02/28/22 11.44 sec Goal status: Met   LONG TERM GOALS: Target date: 06/01/2022   I with final HEP Baseline:  Goal status: INITIAL   2.  5x STS in < 12 sec. Baseline:  Goal status:  INITIAL  3.  TUG in < 12 sec Baseline:  Goal status: INITIAL  4.  FGA of at least 24/30 Baseline:  Goal status: INITIAL  5.  Ambulate at least 400' with LRAD, S Baseline:  Goal status: INITIAL   PLAN: PT FREQUENCY: 2x/week  PT DURATION: 12 weeks  PLANNED INTERVENTIONS: Therapeutic exercises, Therapeutic activity, Neuromuscular re-education, Balance training, Gait training, Patient/Family education, and Manual therapy  PLAN FOR                     NEXT SESSION: continue with gait training and LE strengthening    Scot Jun, PTA 03/31/2022, 1:50 PM

## 2022-04-05 ENCOUNTER — Ambulatory Visit: Payer: 59

## 2022-04-05 DIAGNOSIS — R2681 Unsteadiness on feet: Secondary | ICD-10-CM

## 2022-04-05 DIAGNOSIS — R2689 Other abnormalities of gait and mobility: Secondary | ICD-10-CM

## 2022-04-05 DIAGNOSIS — M6281 Muscle weakness (generalized): Secondary | ICD-10-CM | POA: Diagnosis not present

## 2022-04-05 DIAGNOSIS — R262 Difficulty in walking, not elsewhere classified: Secondary | ICD-10-CM

## 2022-04-05 NOTE — Therapy (Signed)
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[place]) 07/24/2021   Pacemaker [place] MRI Dual chamber pacemaker 07/24/2021   Stroke ([address])    Type 2 diabetes mellitus ([place])    Past Surgical History:  Procedure Laterality Date   COLONOSCOPY WITH PROPOFOL N/A 11/26/2021   Procedure: COLONOSCOPY WITH PROPOFOL;  Surgeon: Carol Ada, MD;  [facility];  Service: Endoscopy;  Laterality: N/A;   CORONARY ARTERY BYPASS GRAFT     POLYPECTOMY  11/26/2021   Procedure: POLYPECTOMY;  Surgeon: Carol Ada, MD;  [facility];  Service: Endoscopy;;   Patient Active Problem List   Diagnosis Date Noted   Dyslipidemia 01/06/2022   Essential hypertension 01/06/2022   Acute lower UTI 01/06/2022   Acute kidney injury superimposed on chronic kidney disease ([place]) 01/06/2022   DKA, type 2 ([place]) 01/05/2022   Sinus node dysfunction ([place]) 11/12/2021   Type 2 diabetes mellitus ([place]) 09/02/2021   Mixed hyperlipidemia 08/12/2021   Pacemaker [place] MRI Dual chamber pacemaker 07/24/2021   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 No pain or falls.    PERTINENT HISTORY: Low blood pressure Gastroesophageal reflux disease Physical deconditioning Weight loss Insomnia  PAIN:  Are you having pain? No  PRECAUTIONS: None  WEIGHT BEARING RESTRICTIONS No  FALLS:  Has patient fallen in last 6 months? Yes. Number of falls 3   PLOF: Needs assistance with ADLs  PATIENT GOALS Get stronger and walk again.    OBJECTIVE:   MUSCLE LENGTH: Eval Hamstrings: Right 65 deg; Left 65 deg   POSTURE: rounded shoulders and forward head   LOWER EXTREMITY ROM: Grossly WFL except SLR and B DF limited.  LOWER EXTREMITY MMT:  MMT Right eval Left eval  Hip flexion 4- 4-  Hip extension 4- 4-  Hip abduction 4- 4-  Hip adduction    Hip internal rotation    Hip external rotation    Knee flexion 4- 4-  Knee extension 4- 4-  Ankle dorsiflexion 4- 4-  Ankle plantarflexion 4- 4-  Ankle inversion    Ankle eversion     (Blank rows = not tested)    FUNCTIONAL TESTS:   Eval 5 times sit to stand: 23.35 Timed up and go (TUG): 21.62 Functional gait assessment: TBD     GAIT:  Eval Distance walked: 150',  Assistive device utilized: Environmental consultant -  2 wheeled Level of assistance: CGA Comments: Level surfaces. Unsteady, small steps, slow  TODAY'S TREATMENT: 04/05/22 Nustep L5 x22mins  Hamstring curls 20# 2x15 Leg ext 20# 3x12 Step ups 6" 2HHA  Side steps on foam 2HHA Catch on foam red ball   03/31/22 NuStep L5 x6 min . 20lb $ way resisted gait x3 each  Rows green standing 2x10 Ext standing shoulder 2x10  Leg curls 20lb 2x12 Leg Ext 10lb 2x12  03/29/22 Walking outside small loop  Walking in parallel bars without HHA, forwards, backwards, side steps CGA Minisquats with ball OHP 2x7  Step ups forwards 20 reps with 2HHA on  stairs Seated hamstring stretch 30sec BLE Cybex 15# hamstring 2x10  03/24/2022 NuStep L5 x 7 min  S2S x10 LE pushes against table Standing march with rollator 2x10  Leg press 30lb 2x10 Hamstring curls 20lb 2x12  Leg Ext 5lb 2x12 Gait 354ft with rollator  Standing reaching outside BOS     PATIENT EDUCATION:  Education details: OC, posture  Person educated: Patient and Spouse Education method: Explanation, Demonstration, Tactile cues, and Verbal cues Education comprehension: verbalized understanding   HOME EXERCISE PROGRAM: TBD  ASSESSMENT:  CLINICAL IMPRESSION: Pt enters feeling well with no pain. He has progressed with strength and balance. Attempted more challenging balance activities with side steps on foam and multitasking on foam. Patient requires CGA due to LOB while playing catch. Continued with general LE strengthening to increase stability with gait and decrease risk for falls.   OBJECTIVE IMPAIRMENTS Weakness, decreased blaance, unsteadiness on feet, difficulty walking, decreased ROM, decreased flexibility, decreased posture  ACTIVITY LIMITATIONS carrying, lifting, bending, standing, squatting, stairs, transfers, bed mobility, bathing, toileting, dressing, self feeding, reach over head, hygiene/grooming, and locomotion level  PARTICIPATION LIMITATIONS: meal prep, cleaning, driving, shopping, community activity, and yard work  [place], Past/current experiences, and 1 comorbidity: Kidney disease,  are also affecting patient's functional outcome.   REHAB POTENTIAL: Good  CLINICAL DECISION MAKING: Evolving/moderate complexity  EVALUATION COMPLEXITY: Moderate   GOALS: Goals reviewed with patient? Yes  SHORT TERM GOALS: Target date: 04/06/2022   I with initial HEP Baseline: Goal status: Met  2.  5 x STS in < 15 sec to demonstrate improved balance and strength. Baseline:  03/31/22 15.07 sec  Goal status: Progressing   3.  TUG in <15 sec to  demonstrate improved balance. Baseline: 02/28/22 11.44 sec Goal status: Met   LONG TERM GOALS: Target date: 06/01/2022   I with final HEP Baseline:  Goal status: INITIAL   2.  5x STS in < 12 sec. Baseline:  Goal status: INITIAL  3.  TUG in < 12 sec Baseline:  Goal status: INITIAL  4.  FGA of at least 24/30 Baseline:  Goal status: INITIAL  5.  Ambulate at least 400' with LRAD, S Baseline:  Goal status: INITIAL   PLAN: PT FREQUENCY: 2x/week  PT DURATION: 12 weeks  PLANNED INTERVENTIONS: Therapeutic exercises, Therapeutic activity, Neuromuscular re-education, Balance training, Gait training, Patient/Family education, and Manual therapy  PLAN FOR                     NEXT SESSION: continue with gait training and LE strengthening    Andris Baumann, PT 04/05/2022, 1:56 PM

## 2022-04-07 ENCOUNTER — Encounter: Payer: Self-pay | Admitting: Physical Therapy

## 2022-04-07 ENCOUNTER — Ambulatory Visit: Payer: 59 | Admitting: Physical Therapy

## 2022-04-07 DIAGNOSIS — R262 Difficulty in walking, not elsewhere classified: Secondary | ICD-10-CM

## 2022-04-07 DIAGNOSIS — R2681 Unsteadiness on feet: Secondary | ICD-10-CM

## 2022-04-07 DIAGNOSIS — Z9181 History of falling: Secondary | ICD-10-CM

## 2022-04-07 DIAGNOSIS — M6281 Muscle weakness (generalized): Secondary | ICD-10-CM | POA: Diagnosis not present

## 2022-04-07 NOTE — Therapy (Signed)
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 07/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[place]) 07/24/2021   Pacemaker [place] MRI Dual chamber pacemaker 07/24/2021   Stroke ([place])    Type 2 diabetes mellitus ([place])    Past Surgical History:  Procedure Laterality Date   COLONOSCOPY WITH PROPOFOL N/A 11/26/2021   Procedure: COLONOSCOPY WITH PROPOFOL;  Surgeon: Carol Ada, MD;  [facility];  Service: Endoscopy;  Laterality: N/A;   CORONARY ARTERY BYPASS GRAFT     POLYPECTOMY  11/26/2021   Procedure: POLYPECTOMY;  Surgeon: Carol Ada, MD;  [facility];  Service: Endoscopy;;   Patient Active Problem List   Diagnosis Date Noted   Dyslipidemia 01/06/2022   Essential hypertension 01/06/2022   Acute lower UTI 01/06/2022   Acute kidney injury superimposed on chronic kidney disease ([place]) 01/06/2022   DKA, type 2 ([place]) 01/05/2022   Sinus node dysfunction ([place]) 11/12/2021   Type 2 diabetes mellitus ([place]) 09/02/2021   Mixed hyperlipidemia 08/12/2021   Pacemaker [employer] Acc[place] MRI Dual chamber pacemaker 07/24/2021   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 No  PRECAUTIONS: None  WEIGHT BEARING RESTRICTIONS No  FALLS:  Has patient fallen in last 6 months? Yes. Number of falls 3   PLOF: Needs assistance with ADLs  PATIENT GOALS Get stronger and walk again.    OBJECTIVE:   MUSCLE LENGTH: Eval Hamstrings: Right 65 deg; Left 65 deg   POSTURE: rounded shoulders and forward head   LOWER EXTREMITY ROM: Grossly WFL except SLR and B DF limited.  LOWER EXTREMITY MMT:  MMT Right eval Left eval  Hip flexion 4- 4-  Hip extension 4- 4-  Hip abduction 4- 4-  Hip adduction    Hip internal rotation    Hip external rotation    Knee flexion 4- 4-  Knee extension 4- 4-  Ankle dorsiflexion 4- 4-  Ankle plantarflexion 4- 4-  Ankle inversion    Ankle eversion     (Blank rows = not tested)    FUNCTIONAL TESTS:   Eval 5 times sit to stand: 23.35 Timed up and go (TUG): 21.62 Functional gait assessment: TBD     GAIT:  Distance walked: 200', 300' Assistive device utilized: None Level of assistance: CGA Comments: Level surfaces.  Unsteady, small steps  TODAY'S TREATMENT: 04/07/22 NuStep L4 x 6 min    20lb 4 way resisted gait x3 each Standing OHP yellow ball x10 then whilw standing on airex x10 Leg Press 40lb 2x10 Alt 4 in box taps 2x10  04/05/22 Nustep L5 x43mns  Hamstring curls 20# 2x15 Leg ext 20# 3x12 Step ups 6" 2HHA  Side steps on foam 2HHA Catch on foam red ball   03/31/22 NuStep L5 x6 min . 20lb 4 way resisted gait x3 each  Rows green standing 2x10 Ext standing shoulder 2x10  Leg curls 20lb 2x12 Leg Ext 10lb 2x12  03/29/22 Walking outside small loop  Walking in parallel bars  without HHA, forwards, backwards, side steps CGA Minisquats with ball OHP 2x7  Step ups forwards 20 reps with 2HHA on stairs Seated hamstring stretch 30sec BLE Cybex 15# hamstring 2x10  03/24/2022 NuStep L5 x 7 min  S2S x10 LE pushes against table Standing march with rollator 2x10  Leg press 30lb 2x10 Hamstring curls 20lb 2x12  Leg Ext 5lb 2x12 Gait 3028fwith rollator  Standing reaching outside BOS     PATIENT EDUCATION:  Education details: OC, posture  Person educated: Patient and Spouse Education method: Explanation, Demonstration, Tactile cues, and Verbal cues Education comprehension: verbalized understanding   HOME EXERCISE PROGRAM: TBD  ASSESSMENT:  CLINICAL IMPRESSION: Pt enters feeling well with no pain. Pt did well with a progressed session. Pt completed therapy session without rollator for mobility. Cue to press though heels needed with leg press. Multiple gait trials without Rollator. Increase fatigue with resisted gait with some instability. CGA needed with alt box taps LOB x1, good righting reactions.   OBJECTIVE IMPAIRMENTS Weakness, decreased blaance, unsteadiness on feet, difficulty walking, decreased ROM, decreased flexibility, decreased posture  ACTIVITY LIMITATIONS carrying, lifting, bending, standing, squatting, stairs, transfers, bed mobility, bathing, toileting, dressing, self feeding, reach over head, hygiene/grooming, and locomotion level  PARTICIPATION LIMITATIONS: meal prep, cleaning, driving, shopping, community activity, and yard work  [place] experiences, and 1 comorbidity: Kidney disease,  are also affecting patient's functional outcome.   REHAB POTENTIAL: Good  CLINICAL DECISION MAKING: Evolving/moderate complexity  EVALUATION COMPLEXITY: Moderate   GOALS: Goals reviewed with patient? Yes  SHORT TERM GOALS: Target date: 04/06/2022   I with initial HEP Baseline: Goal status: Met  2.  5 x STS in < 15 sec  to demonstrate improved balance and strength. Baseline:  03/31/22 15.07 sec  Goal status: Progressing   3.  TUG in <15 sec to demonstrate improved balance. Baseline: 02/28/22 11.44 sec Goal status: Met   LONG TERM GOALS: Target date: 06/01/2022   I with final HEP Baseline:  Goal status: INITIAL   2.  5x STS in < 12 sec. Baseline:  Goal status: INITIAL  3.  TUG in < 12 sec Baseline:  Goal status: INITIAL  4.  FGA of at least 24/30 Baseline:  Goal status: INITIAL  5.  Ambulate at least 400' with LRAD, S Baseline:  Goal status: INITIAL   PLAN: PT FREQUENCY: 2x/week  PT DURATION: 12 weeks  PLANNED INTERVENTIONS: Therapeutic exercises, Therapeutic activity, Neuromuscular re-education, Balance training, Gait training, Patient/Family education, and Manual therapy  PLAN FOR                     NEXT SESSION: continue with gait training and LE strengthening    [place] JunPTA 04/07/2022, 1:49 PM

## 2022-04-12 ENCOUNTER — Ambulatory Visit: Payer: 59 | Admitting: Physical Therapy

## 2022-04-12 ENCOUNTER — Encounter: Payer: Self-pay | Admitting: Physical Therapy

## 2022-04-12 DIAGNOSIS — Z9181 History of falling: Secondary | ICD-10-CM

## 2022-04-12 DIAGNOSIS — R262 Difficulty in walking, not elsewhere classified: Secondary | ICD-10-CM

## 2022-04-12 DIAGNOSIS — M6281 Muscle weakness (generalized): Secondary | ICD-10-CM

## 2022-04-12 DIAGNOSIS — R2681 Unsteadiness on feet: Secondary | ICD-10-CM

## 2022-04-12 NOTE — Therapy (Signed)
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 07/24/2021   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HCC) 07/24/2021   Pacemaker [employer] Acc[place]ade MRI Dual chamber pacemaker 07/24/2021   Stroke (HCC)    Type 2 diabetes mellitus (HCC)    Past Surgical History:  Procedure Laterality Date   COLONOSCOPY WITH PROPOFOL N/A 11/26/2021   Procedure: COLONOSCOPY WITH PROPOFOL;  Surgeon: Jeani Hawking, MD;  Location: [facility];  Service: Endoscopy;  Laterality: N/A;   CORONARY ARTERY BYPASS GRAFT     POLYPECTOMY  11/26/2021   Procedure: POLYPECTOMY;  Surgeon: Jeani Hawking, MD;  Location: [facility];  Service: Endoscopy;;   Patient Active Problem List   Diagnosis Date Noted   Dyslipidemia 01/06/2022   Essential hypertension 01/06/2022   Acute lower UTI 01/06/2022   Acute kidney injury superimposed on chronic kidney disease (HCC) 01/06/2022   DKA, type 2 (HCC) 01/05/2022   Sinus node dysfunction (HCC) 11/12/2021   Type 2 diabetes mellitus (HCC) 09/02/2021   Mixed hyperlipidemia 08/12/2021   Pacemaker [employer] Accolade MRI Dual chamber pacemaker 07/24/2021   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 No  PRECAUTIONS: None  WEIGHT BEARING RESTRICTIONS No  FALLS:  Has patient fallen in last 6 months? Yes. Number of falls 3   PLOF: Needs assistance with ADLs  PATIENT GOALS Get stronger and walk again.    OBJECTIVE:   MUSCLE LENGTH: Eval Hamstrings: Right 65 deg; Left 65 deg   POSTURE: rounded shoulders and forward head   LOWER EXTREMITY ROM: Grossly WFL except SLR and B DF limited.  LOWER EXTREMITY MMT:  MMT Right eval Left eval  Hip flexion 4- 4-  Hip extension 4- 4-  Hip abduction 4- 4-  Hip adduction    Hip internal rotation    Hip external rotation    Knee flexion 4- 4-  Knee extension 4- 4-  Ankle dorsiflexion 4- 4-  Ankle plantarflexion 4- 4-  Ankle inversion    Ankle eversion     (Blank rows = not tested)    FUNCTIONAL TESTS:   Eval 5 times sit to stand: 23.35 Timed up and go (TUG): 21.62 Functional gait assessment: TBD     GAIT:  Distance walked: 425ft Assistive device utilized: None Level of assistance: CGA Comments: Level surfaces. Unsteady, small steps  TODAY'S TREATMENT:  04/12/22 NuStep L5 x6 min Supine bridges 2x10 Supine hip abd x10 each  LE on Pball bridges, K2C, Oblq Leg press 40 lb high and for glute 2x10  Hamstring curls 25# 2x12 Leg ext 15# 3x12 Stair negotiation 15 steps 5 in one rail R alt patten    04/07/22 NuStep L4 x 6 min    20lb 4 way resisted gait x3 each Standing OHP yellow ball x10 then whilw standing on airex x10 Leg Press 40lb 2x10 Alt 4 in box taps 2x10  04/05/22 Nustep L5 x58mins  Hamstring curls 20# 2x15 Leg ext 20# 3x12 Step ups 6" 2HHA  Side steps on foam 2HHA Catch on foam red  ball   03/31/22 NuStep L5 x6 min . 20lb 4 way resisted gait x3 each  Rows green standing 2x10 Ext standing shoulder 2x10  Leg curls 20lb 2x12 Leg Ext 10lb 2x12  03/29/22 Walking outside small loop  Walking in parallel bars without HHA, forwards, backwards, side steps CGA Minisquats with ball OHP 2x7  Step ups forwards 20 reps with 2HHA on stairs Seated hamstring stretch 30sec BLE Cybex 15# hamstring 2x10  03/24/2022 NuStep L5 x 7 min  S2S x10 LE pushes against table Standing march with rollator 2x10  Leg press 30lb 2x10 Hamstring curls 20lb 2x12  Leg Ext 5lb 2x12 Gait 383ft with rollator  Standing reaching outside BOS     PATIENT EDUCATION:  Education details: OC, posture  Person educated: Patient and Spouse Education method: Explanation, Demonstration, Tactile cues, and Verbal cues Education comprehension: verbalized understanding   HOME EXERCISE PROGRAM: TBD  ASSESSMENT:  CLINICAL IMPRESSION: Pt enters feeling well with no pain. Pt did well with a progressed session. Pt completed therapy session without rollator for mobility. Initial focus on glute activation with supine interventions and on leg press. Pt able to increase his single trial gait distance without AD. Cue to maintain  sequence with stairs. Increase fatigue post session.  OBJECTIVE IMPAIRMENTS Weakness, decreased blaance, unsteadiness on feet, difficulty walking, decreased ROM, decreased flexibility, decreased posture  ACTIVITY LIMITATIONS carrying, lifting, bending, standing, squatting, stairs, transfers, bed mobility, bathing, toileting, dressing, self feeding, reach over head, hygiene/grooming, and locomotion level  PARTICIPATION LIMITATIONS: meal prep, cleaning, driving, shopping, community activity, and yard work  PERSONAL FACTORS Fitness, Past/current experiences, and 1 comorbidity: Kidney disease,  are also affecting patient's functional outcome.   REHAB POTENTIAL: Good  CLINICAL DECISION  MAKING: Evolving/moderate complexity  EVALUATION COMPLEXITY: Moderate   GOALS: Goals reviewed with patient? Yes  SHORT TERM GOALS: Target date: 04/06/2022   I with initial HEP Baseline: Goal status: Met  2.  5 x STS in < 15 sec to demonstrate improved balance and strength. Baseline:  03/31/22 15.07 sec  Goal status: Progressing   3.  TUG in <15 sec to demonstrate improved balance. Baseline: 02/28/22 11.44 sec Goal status: Met   LONG TERM GOALS: Target date: 06/01/2022   I with final HEP Baseline:  Goal status: INITIAL   2.  5x STS in < 12 sec. Baseline:  Goal status: INITIAL  3.  TUG in < 12 sec Baseline:  Goal status: INITIAL  4.  FGA of at least 24/30 Baseline:  Goal status: INITIAL  5.  Ambulate at least 400' with LRAD, S Baseline:  Goal status: Progressing   PLAN: PT FREQUENCY: 2x/week  PT DURATION: 12 weeks  PLANNED INTERVENTIONS: Therapeutic exercises, Therapeutic activity, Neuromuscular re-education, Balance training, Gait training, Patient/Family education, and Manual therapy  PLAN FOR  NEXT SESSION: continue with gait training and LE strengthening    Grayce Sessions, PTA 04/12/2022, 1:49 PM

## 2022-04-14 ENCOUNTER — Ambulatory Visit: Payer: 59

## 2022-04-14 DIAGNOSIS — R2681 Unsteadiness on feet: Secondary | ICD-10-CM

## 2022-04-14 DIAGNOSIS — M6281 Muscle weakness (generalized): Secondary | ICD-10-CM

## 2022-04-14 DIAGNOSIS — R2689 Other abnormalities of gait and mobility: Secondary | ICD-10-CM

## 2022-04-14 DIAGNOSIS — Z9181 History of falling: Secondary | ICD-10-CM

## 2022-04-14 DIAGNOSIS — R262 Difficulty in walking, not elsewhere classified: Secondary | ICD-10-CM

## 2022-04-14 NOTE — Therapy (Signed)
OUTPATIENT PHYSICAL THERAPY LOWER EXTREMITY TREATMENT   Patient Name: John Moyer MRN: [id number] DOB:06/11/1956, 66 y.o., male Today's Date: 04/14/2022   PT End of Session - 04/14/22 1313     Visit Number 11    Date for PT Re-Evaluation 06/01/22    PT Start Time 1315    PT Stop Time 1356    PT Time Calculation (min) 41 min    Equipment Utilized During Treatment Gait belt    Activity Tolerance Patient tolerated treatment well;Patient limited by fatigue    Behavior During Therapy [facility] for tasks assessed/performed                   Past Medical History:  Diagnosis Date   CAD (coronary artery disease)    Heart block AV complete ([place]) Pacer dependant 07/24/2021   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[place]) 07/24/2021   Pacemaker [place] MRI Dual chamber pacemaker 07/24/2021   Stroke ([place])    Type 2 diabetes mellitus ([place])    Past Surgical History:  Procedure Laterality Date   COLONOSCOPY WITH PROPOFOL N/A 11/26/2021   Procedure: COLONOSCOPY WITH PROPOFOL;  Surgeon: Carol Ada, MD;  Location: [facility];  Service: Endoscopy;  Laterality: N/A;   CORONARY ARTERY BYPASS GRAFT     POLYPECTOMY  11/26/2021   Procedure: POLYPECTOMY;  Surgeon: Carol Ada, MD;  [facility];  Service: Endoscopy;;   Patient Active Problem List   Diagnosis Date Noted   Dyslipidemia 01/06/2022   Essential hypertension 01/06/2022   Acute lower UTI 01/06/2022   Acute kidney injury superimposed on chronic kidney disease ([place]) 01/06/2022   DKA, type 2 ([place]) 01/05/2022   Sinus node dysfunction ([place]) 11/12/2021   Type 2 diabetes mellitus ([place]) 09/02/2021   Mixed hyperlipidemia 08/12/2021   Pacemaker [place] MRI Dual chamber pacemaker 07/24/2021   Heart block AV complete ([place]) Pacer dependant 07/24/2021   Encounter for care of pacemaker 07/08/2021   Coronary artery disease 07/07/2021    PCP: Latanya Presser  REFERRING PROVIDER: Latanya Presser  REFERRING DIAG: R68.89   THERAPY DIAG: M62.81, R 26.2, R 26.81   Rationale for Evaluation and Treatment Rehabilitation  ONSET DATE: 02/16/22  SUBJECTIVE:   SUBJECTIVE STATEMENT: "Doing good, don't have pain. I can walk a little better now and getting better with balance."  PERTINENT HISTORY: Low blood pressure Gastroesophageal reflux disease Physical deconditioning Weight loss Insomnia  PAIN:  Are you having pain? No  PRECAUTIONS: None  WEIGHT BEARING RESTRICTIONS No  FALLS:  Has patient fallen in last 6 months? Yes. Number of falls 3   PLOF: Needs assistance with ADLs  PATIENT GOALS Get stronger and walk again.    OBJECTIVE:   MUSCLE LENGTH: Eval Hamstrings: Right 65 deg; Left 65 deg   POSTURE: rounded shoulders and forward head   LOWER EXTREMITY ROM: Grossly WFL except SLR and B DF limited.  LOWER EXTREMITY MMT:  MMT Right eval Left eval  Hip flexion 4- 4-  Hip extension 4- 4-  Hip abduction 4- 4-  Hip adduction    Hip internal rotation    Hip external rotation    Knee flexion 4- 4-  Knee extension 4- 4-  Ankle dorsiflexion 4- 4-  Ankle plantarflexion 4- 4-  Ankle inversion    Ankle eversion     (Blank rows = not tested)    FUNCTIONAL TESTS:   Eval 5 times sit to stand: 23.35 Timed up and go (TUG): 21.62 Functional gait assessment: TBD  GAIT:  Distance walked: 412f Assistive device utilized: None Level of assistance: CGA Comments: Level surfaces. Unsteady, small steps  TODAY'S TREATMENT: 04/14/22 Nustep L5 x64ms Step ups 4" 1HHA Resisted gait 10# forwards and sideways x3 Walking without walker 2 laps  Seated ball roll outs x10 Lat pull downs 15# 2x10    Leg press 60# 2x10  04/12/22 NuStep L5 x6 min Supine bridges 2x10 Supine hip abd x10 each  LE on Pball bridges, K2C, Oblq Leg press 40 lb high and for glute 2x10  Hamstring curls 25# 2x12 Leg ext 15# 3x12 Stair negotiation 15 steps 5 in one rail R alt  patten    04/07/22 NuStep L4 x 6 min    20lb 4 way resisted gait x3 each Standing OHP yellow ball x10 then whilw standing on airex x10 Leg Press 40lb 2x10 Alt 4 in box taps 2x10  04/05/22 Nustep L5 x6m28m  Hamstring curls 20# 2x15 Leg ext 20# 3x12 Step ups 6" 2HHA  Side steps on foam 2HHA Catch on foam red ball   03/31/22 NuStep L5 x6 min . 20lb 4 way resisted gait x3 each  Rows green standing 2x10 Ext standing shoulder 2x10  Leg curls 20lb 2x12 Leg Ext 10lb 2x12  03/29/22 Walking outside small loop  Walking in parallel bars without HHA, forwards, backwards, side steps CGA Minisquats with ball OHP 2x7  Step ups forwards 20 reps with 2HHA on stairs Seated hamstring stretch 30sec BLE Cybex 15# hamstring 2x10  03/24/2022 NuStep L5 x 7 min  S2S x10 LE pushes against table Standing march with rollator 2x10  Leg press 30lb 2x10 Hamstring curls 20lb 2x12  Leg Ext 5lb 2x12 Gait [id number] rollator  Standing reaching outside BOS     PATIENT EDUCATION:  Education details: OC, posture  Person educated: Patient and Spouse Education method: Explanation, Demonstration, Tactile cues, and Verbal cues Education comprehension: verbalized understanding   HOME EXERCISE PROGRAM: TBD  ASSESSMENT:  CLINICAL IMPRESSION:  Following progress last session we continued with LE strength training to help with gait and balance. He is able to do step ups on to 4" step with only 1 HHA, progressing from previous visits. Presents with difficulty doing resisted gait sideways and states it his back is hurting. Able to make 2 laps without RW but needs to rest due to back pain. We took a break and added some exercises for his back. Increased fatigue at end of session and needs a break.   OBJECTIVE IMPAIRMENTS Weakness, decreased blaance, unsteadiness on feet, difficulty walking, decreased ROM, decreased flexibility, decreased posture  ACTIVITY LIMITATIONS carrying, lifting, bending, standing,  squatting, stairs, transfers, bed mobility, bathing, toileting, dressing, self feeding, reach over head, hygiene/grooming, and locomotion level  PARTICIPATION LIMITATIONS: meal prep, cleaning, driving, shopping, community activity, and yard work  PERSPimast/current experiences, and 1 comorbidity: Kidney disease,  are also affecting patient's functional outcome.   REHAB POTENTIAL: Good  CLINICAL DECISION MAKING: Evolving/moderate complexity  EVALUATION COMPLEXITY: Moderate   GOALS: Goals reviewed with patient? Yes  SHORT TERM GOALS: Target date: 04/06/2022   I with initial HEP Baseline: Goal status: Met  2.  5 x STS in < 15 sec to demonstrate improved balance and strength. Baseline:  03/31/22 15.07 sec  Goal status: Progressing   3.  TUG in <15 sec to demonstrate improved balance. Baseline: 02/28/22 11.44 sec Goal status: Met   LONG TERM GOALS: Target date: 06/01/2022   I with final HEP Baseline:  Goal status:  INITIAL   2.  5x STS in < 12 sec. Baseline:  Goal status: INITIAL  3.  TUG in < 12 sec Baseline:  Goal status: INITIAL  4.  FGA of at least 24/30 Baseline:  Goal status: INITIAL  5.  Ambulate at least 400' with LRAD, S Baseline:  Goal status: Progressing   PLAN: PT FREQUENCY: 2x/week  PT DURATION: 12 weeks  PLANNED INTERVENTIONS: Therapeutic exercises, Therapeutic activity, Neuromuscular re-education, Balance training, Gait training, Patient/Family education, and Manual therapy  PLAN FOR                     NEXT SESSION: continue with gait training and LE strengthening    Andris Baumann, PT 04/14/2022, 1:58 PM

## 2022-06-14 ENCOUNTER — Other Ambulatory Visit: Payer: Self-pay | Admitting: Nephrology

## 2022-06-14 DIAGNOSIS — N1832 Chronic kidney disease, stage 3b: Secondary | ICD-10-CM

## 2022-06-14 DIAGNOSIS — I129 Hypertensive chronic kidney disease with stage 1 through stage 4 chronic kidney disease, or unspecified chronic kidney disease: Secondary | ICD-10-CM

## 2022-06-14 DIAGNOSIS — E1122 Type 2 diabetes mellitus with diabetic chronic kidney disease: Secondary | ICD-10-CM

## 2022-06-14 DIAGNOSIS — Z8639 Personal history of other endocrine, nutritional and metabolic disease: Secondary | ICD-10-CM

## 2022-06-14 DIAGNOSIS — K5909 Other constipation: Secondary | ICD-10-CM

## 2022-06-14 DIAGNOSIS — D631 Anemia in chronic kidney disease: Secondary | ICD-10-CM

## 2022-07-06 ENCOUNTER — Ambulatory Visit
Admission: RE | Admit: 2022-07-06 | Discharge: 2022-07-06 | Disposition: A | Discharge status: Home / Self Care | Payer: Self-pay | Source: Ambulatory Visit | Attending: Nephrology | Admitting: Nephrology

## 2022-07-06 DIAGNOSIS — K5909 Other constipation: Secondary | ICD-10-CM

## 2022-07-06 DIAGNOSIS — E1122 Type 2 diabetes mellitus with diabetic chronic kidney disease: Secondary | ICD-10-CM

## 2022-07-06 DIAGNOSIS — Z8639 Personal history of other endocrine, nutritional and metabolic disease: Secondary | ICD-10-CM

## 2022-07-06 DIAGNOSIS — I129 Hypertensive chronic kidney disease with stage 1 through stage 4 chronic kidney disease, or unspecified chronic kidney disease: Secondary | ICD-10-CM

## 2022-07-06 DIAGNOSIS — N1832 Chronic kidney disease, stage 3b: Secondary | ICD-10-CM

## 2022-07-06 DIAGNOSIS — N189 Chronic kidney disease, unspecified: Secondary | ICD-10-CM

## 2022-07-11 ENCOUNTER — Other Ambulatory Visit: Payer: Self-pay | Admitting: Cardiology

## 2022-07-11 DIAGNOSIS — I2510 Atherosclerotic heart disease of native coronary artery without angina pectoris: Secondary | ICD-10-CM

## 2022-07-11 DIAGNOSIS — E782 Mixed hyperlipidemia: Secondary | ICD-10-CM

## 2022-10-31 ENCOUNTER — Telehealth: Payer: Self-pay

## 2022-10-31 ENCOUNTER — Encounter: Payer: Self-pay | Admitting: Cardiology

## 2022-10-31 NOTE — Telephone Encounter (Signed)
Patient will be leaving out of the country in the next 12 hours and he needs a letter for the airlines to allow him to board. Son will be here in the next 30 minutes to an hour to pick up that letter. Thank you.

## 2022-10-31 NOTE — Telephone Encounter (Signed)
He has not seen [place] for > 1 year and has not established with [facility] and saw them recently, hence needs to request them

## 2022-10-31 NOTE — Telephone Encounter (Signed)
Spoke with patient and he has not yet seen them. I explained to him that he has not been here for more than a year and he stated that he and his wife are having to travel to [place] for a family emergency and is asking if you could give him a call.

## 2022-10-31 NOTE — Telephone Encounter (Signed)
Sent him letter on [employer]

## 2023-01-04 NOTE — Progress Notes (Signed)
Appointment rescheduled to 6 months due to insurance changes.

## 2023-01-10 ENCOUNTER — Ambulatory Visit: Payer: BLUE CROSS/BLUE SHIELD | Admitting: Cardiology

## 2023-01-10 DIAGNOSIS — Z45018 Encounter for adjustment and management of other part of cardiac pacemaker: Secondary | ICD-10-CM

## 2023-01-10 DIAGNOSIS — I495 Sick sinus syndrome: Secondary | ICD-10-CM

## 2023-01-10 DIAGNOSIS — Z950 Presence of cardiac pacemaker: Secondary | ICD-10-CM

## 2023-03-22 ENCOUNTER — Other Ambulatory Visit: Payer: Self-pay | Admitting: Cardiology

## 2023-03-22 DIAGNOSIS — I2510 Atherosclerotic heart disease of native coronary artery without angina pectoris: Secondary | ICD-10-CM

## 2023-03-22 DIAGNOSIS — E782 Mixed hyperlipidemia: Secondary | ICD-10-CM

## 2023-07-01 ENCOUNTER — Other Ambulatory Visit: Payer: Self-pay | Admitting: Cardiology

## 2023-07-01 DIAGNOSIS — E782 Mixed hyperlipidemia: Secondary | ICD-10-CM

## 2023-07-01 DIAGNOSIS — I2510 Atherosclerotic heart disease of native coronary artery without angina pectoris: Secondary | ICD-10-CM

## 2023-07-05 ENCOUNTER — Telehealth: Payer: Self-pay

## 2023-07-05 NOTE — Telephone Encounter (Signed)
I explained to the patient how the app work in detail. I told him he can write his questions down and the nurse will answer all of them at the wound check appointment.

## 2023-07-13 ENCOUNTER — Encounter: Payer: BLUE CROSS/BLUE SHIELD | Admitting: Cardiology

## 2023-07-18 ENCOUNTER — Telehealth: Payer: Self-pay | Admitting: Pharmacy Technician

## 2023-07-18 ENCOUNTER — Other Ambulatory Visit (HOSPITAL_COMMUNITY): Payer: Self-pay

## 2023-07-18 NOTE — Telephone Encounter (Signed)
Pharmacy Patient Advocate Encounter   Received notification from CoverMyMeds that prior authorization for repatha is required/requested.   Insurance verification completed.   The patient is insured through [facility] ADVANTAGE/RX ADVANCE .   Per test claim: Refill too soon. PA is not needed at this time. Medication was filled 07/13/23. Next eligible fill date is 08/03/23.

## 2023-08-07 ENCOUNTER — Ambulatory Visit: Payer: PPO | Attending: Cardiology | Admitting: Physician Assistant

## 2023-08-07 ENCOUNTER — Encounter: Payer: Self-pay | Admitting: Physician Assistant

## 2023-08-07 VITALS — BP 128/74 | HR 73 | Ht 70.0 in | Wt 168.2 lb

## 2023-08-07 DIAGNOSIS — I255 Ischemic cardiomyopathy: Secondary | ICD-10-CM

## 2023-08-07 DIAGNOSIS — I2510 Atherosclerotic heart disease of native coronary artery without angina pectoris: Secondary | ICD-10-CM

## 2023-08-07 DIAGNOSIS — I495 Sick sinus syndrome: Secondary | ICD-10-CM

## 2023-08-07 DIAGNOSIS — Z950 Presence of cardiac pacemaker: Secondary | ICD-10-CM | POA: Diagnosis not present

## 2023-08-07 LAB — CUP PACEART INCLINIC DEVICE CHECK
Date Time Interrogation Session: 20241021173747
Implantable Lead Connection Status: 753985
Implantable Lead Connection Status: 753985
Implantable Lead Implant Date: 20201226
Implantable Lead Implant Date: 20201226
Implantable Lead Location: 753859
Implantable Lead Location: 753860
Implantable Lead Model: 7840
Implantable Lead Model: 7841
Implantable Lead Serial Number: 1022242
Implantable Lead Serial Number: 1046091
Implantable Pulse Generator Implant Date: 20201226
Lead Channel Impedance Value: 623 Ohm
Lead Channel Impedance Value: 858 Ohm
Lead Channel Pacing Threshold Amplitude: 1.1 V
Lead Channel Pacing Threshold Pulse Width: 0.4 ms
Lead Channel Sensing Intrinsic Amplitude: 5.6 mV
Lead Channel Setting Pacing Amplitude: 0.9 V
Lead Channel Setting Pacing Amplitude: 2.5 V
Lead Channel Setting Pacing Pulse Width: 0.4 ms
Lead Channel Setting Sensing Sensitivity: 2.5 mV
Pulse Gen Serial Number: 501354
Zone Setting Status: 755011

## 2023-08-07 NOTE — Progress Notes (Signed)
Cardiology Office Note:  .   Date:  08/07/2023  ID:  John Moyer, DOB 12/30/1955, MRN [id number] PCP: Harvest Forest, MD  [facility] Providers Cardiologist:  Dr. Rosemary Holms  History of Present Illness: .   John Moyer is a 67 y.o. male w/PMHx of CAD (CABG 2019), DM, HLD, stroke, symptomatic bradycardia w/PPM, ICM, CKD (III)  By chart hx 09/2019 was at [facility] in [place]. Patient presented to the [facility] on December 14 after cardiac arrest with remarkable neurological improvement. He reportedly had bradycardia/asystole, underwent CPR, temporary pacing and eventually a permanent pacemaker. Further records, visit from angiogram was not deemed necessary given the patient's presentation was not with STEMI.   He saw Dr. Rosemary Holms Jan 2023, doing well, denied symptoms, pt/wife seemed to disagree on events/timeline, though clinically seemed to be doing well, ambulating with a walker.   Today's visit is scheduled as a pacemaker follow up  ROS:   He is accompanied by his wife He is sedentary, no particular reason.  His wife says he just doesn't want to do much He denies any CP, palpitations or cardiac awareness No SOB, DOE'no near syncope or syncope Reports his PMD does his labs, manages his lipids, sees them tomorrow  They may be moving, neither of them know when or where to, perhaps [place], [place], or [place].  No particular timeline   Device information BSci dual chamber PPM implanted 04/12/2019 (implanted elsewhere)   Studies Reviewed: Marland Kitchen    EKG  done today and reviewed b y myself SR, V pacing 73bpm  DEVICE interrogation done today and reviewed by myself Battery and lead measurements are good No arrhythmias Device dependent    Echocardiogram 07/22/2021:  Left ventricle cavity is normal in size and wall thickness. Abnormal  septal wall motion due to post-operative coronary artery bypass graft.  Normal LV systolic  function with EF 56%. Indeterminate diastolic filling  pattern.  Moderate tricuspid regurgitation. Estimated pulmonary artery systolic  pressure 28 mmHg.    Risk Assessment/Calculations:    Physical Exam:   VS:  There were no vitals taken for this visit.   Wt Readings from Last 3 Encounters:  01/18/22 165 lb (74.8 kg)  01/05/22 164 lb 14.5 oz (74.8 kg)  11/26/21 165 lb (74.8 kg)    GEN: Well nourished, well developed in no acute distress NECK: No JVD; No carotid bruits CARDIAC: RRR, no murmurs, rubs, gallops RESPIRATORY:   CTA b/l without rales, wheezing or rhonchi  ABDOMEN: Soft, non-tender, non-distended EXTREMITIES:  No edema; No deformity   PPM site: is stable, no thinning, fluctuation, tethering  ASSESSMENT AND PLAN: .    PPM Intact function No programming changes made  CAD No anginal symptoms  On ASA, BB, repatha Encourage walking to get started and advance exercise as he is able C/w Dr. Rosemary Holms, he is over due to see  ICM Recovered LVEF by last echo (2022) No symptoms or exam findings to suggest volume OL    Dispo: he is enrolled in our device clinic, discussed he will have an EP specific doctor here as well as Dr. Rosemary Holms, will have him see Dr. Jimmey Ralph in 72mo, sooner if needed.  Will have him scheduled to see Dr. Rosemary Holms give he is overdue   Signed, Sheilah Pigeon, PA-C

## 2023-08-07 NOTE — Patient Instructions (Signed)
Medication Instructions:   Your physician recommends that you continue on your current medications as directed. Please refer to the Current Medication list given to you today.   *If you need a refill on your cardiac medications before your next appointment, please call your pharmacy*   Lab Work:  None ordered.  If you have labs (blood work) drawn today and your tests are completely normal, you will receive your results only by: MyChart Message (if you have MyChart) OR A paper copy in the mail If you have any lab test that is abnormal or we need to change your treatment, we will call you to review the results.   Testing/Procedures:  None ordered.   Follow-Up: At [facility], you and your health needs are our priority.  As part of our continuing mission to provide you with exceptional heart care, we have created designated Provider Care Teams.  These Care Teams include your primary Cardiologist (physician) and Advanced Practice Providers (APPs -  Physician Assistants and Nurse Practitioners) who all work together to provide you with the care you need, when you need it.  We recommend signing up for the patient portal called "MyChart".  Sign up information is provided on this After Visit Summary.  MyChart is used to connect with patients for Virtual Visits (Telemedicine).  Patients are able to view lab/test results, encounter notes, upcoming appointments, etc.  Non-urgent messages can be sent to your provider as well.   To learn more about what you can do with MyChart, go to [email or web address].    Your next appointment:   2 month(s)  Provider:   Dr. Rosemary Holms    Other Instructions  Your physician wants you to follow-up in: 6 months.  You will receive a reminder letter in the mail two months in advance. If you don't receive a letter, please call our office to schedule the follow-up appointment.

## 2023-09-28 ENCOUNTER — Ambulatory Visit: Payer: PPO | Attending: Cardiology | Admitting: Cardiology

## 2023-09-28 NOTE — Progress Notes (Unsigned)
  Cardiology Office Note:  .   Date:  09/28/2023  ID:  John Moyer, DOB 11/23/1955, MRN [id number] PCP: Iona Hansen, NP  [facility] Providers Cardiologist:  Truett Mainland, MD PCP: Iona Hansen, NP  No chief complaint on file.     History of Present Illness: .    John Moyer is a 67 y.o. male with coronary artery disease s/p CABG, type 2 diabetes mellitus, h/o stroke, former smoker   There were no vitals filed for this visit.   ROS: *** ROS   Studies Reviewed: .        *** Independently interpreted ***/202***: Chol ***, TG ***, HDL ***, LDL *** HbA1C ***% Hb *** Cr *** ***  Risk Assessment/Calculations:   {Does this patient have ATRIAL FIBRILLATION?:[phone]}     Physical Exam:   Physical Exam   VISIT DIAGNOSES: No diagnosis found.   ASSESSMENT AND PLAN: .    John Moyer is a 67 y.o. male with coronary artery disease s/p CABG (2019), h/o asystole cardiac arrest and sinus node dysfunction (2020) requiring placement of dual chamber PPM, type 2 diabetes mellitus, CKD 3, h/o stroke, former smoker   *** CAD: S/p CABG,  Continue aspirin, statin, Zetia, Rpatha***carvedilol.   *** H/o ischemic cardiomyopathy: EF recovered to normal (Echocardiogram 07/2021)   *** Sinus node dysfunction: Presentation with asystole cardiac arrest in 2020, underwent dual chamber pacemaker placement Functioning well  {Are you ordering a CV Procedure (e.g. stress test, cath, DCCV, TEE, etc)?   Press F2        :[id number]}    No orders of the defined types were placed in this encounter.    F/u in ***  Signed, Elder Negus, MD

## 2023-11-09 ENCOUNTER — Ambulatory Visit (INDEPENDENT_AMBULATORY_CARE_PROVIDER_SITE_OTHER): Payer: PPO

## 2023-11-09 DIAGNOSIS — I495 Sick sinus syndrome: Secondary | ICD-10-CM | POA: Diagnosis not present

## 2023-11-16 LAB — CUP PACEART REMOTE DEVICE CHECK
Battery Remaining Longevity: 60 mo
Battery Remaining Percentage: 93 %
Brady Statistic RA Percent Paced: 9 %
Brady Statistic RV Percent Paced: 100 %
Date Time Interrogation Session: 20250123035100
Lead Channel Impedance Value: 511 Ohm
Lead Channel Impedance Value: 767 Ohm
Lead Channel Pacing Threshold Amplitude: 0.6 V
Lead Channel Pacing Threshold Amplitude: 0.9 V
Lead Channel Pacing Threshold Pulse Width: 0.4 ms
Lead Channel Pacing Threshold Pulse Width: 0.4 ms
Lead Channel Setting Pacing Amplitude: 1 V
Lead Channel Setting Pacing Amplitude: 2.5 V
Lead Channel Setting Pacing Pulse Width: 0.4 ms
Lead Channel Setting Sensing Sensitivity: 2.5 mV
Pulse Gen Serial Number: 501354
Zone Setting Status: 755011

## 2023-12-21 NOTE — Addendum Note (Signed)
 Addended by: Elease Etienne A on: 12/21/2023 11:00 AM   Modules accepted: Orders

## 2023-12-21 NOTE — Progress Notes (Signed)
 Remote pacemaker transmission.

## 2024-02-06 ENCOUNTER — Other Ambulatory Visit (HOSPITAL_COMMUNITY): Payer: Self-pay

## 2024-02-08 ENCOUNTER — Other Ambulatory Visit (HOSPITAL_COMMUNITY): Payer: Self-pay

## 2024-02-08 ENCOUNTER — Ambulatory Visit (INDEPENDENT_AMBULATORY_CARE_PROVIDER_SITE_OTHER)

## 2024-02-08 DIAGNOSIS — I495 Sick sinus syndrome: Secondary | ICD-10-CM | POA: Diagnosis not present

## 2024-02-08 LAB — CUP PACEART REMOTE DEVICE CHECK
Battery Remaining Longevity: 54 mo
Battery Remaining Percentage: 88 %
Brady Statistic RA Percent Paced: 14 %
Brady Statistic RV Percent Paced: 100 %
Date Time Interrogation Session: 20250424035200
Lead Channel Impedance Value: 576 Ohm
Lead Channel Impedance Value: 775 Ohm
Lead Channel Pacing Threshold Amplitude: 0.5 V
Lead Channel Pacing Threshold Amplitude: 0.9 V
Lead Channel Pacing Threshold Pulse Width: 0.4 ms
Lead Channel Pacing Threshold Pulse Width: 0.4 ms
Lead Channel Setting Pacing Amplitude: 1 V
Lead Channel Setting Pacing Amplitude: 2.5 V
Lead Channel Setting Pacing Pulse Width: 0.4 ms
Lead Channel Setting Sensing Sensitivity: 2.5 mV
Pulse Gen Serial Number: 501354
Zone Setting Status: 755011

## 2024-03-03 IMAGING — DX DG CHEST 1V PORT
1 series · 1 of 1 positions shown · non-contrast
Comparison: None.

CLINICAL DATA: sob

EXAM:
PORTABLE CHEST 1 VIEW

[chest ap]
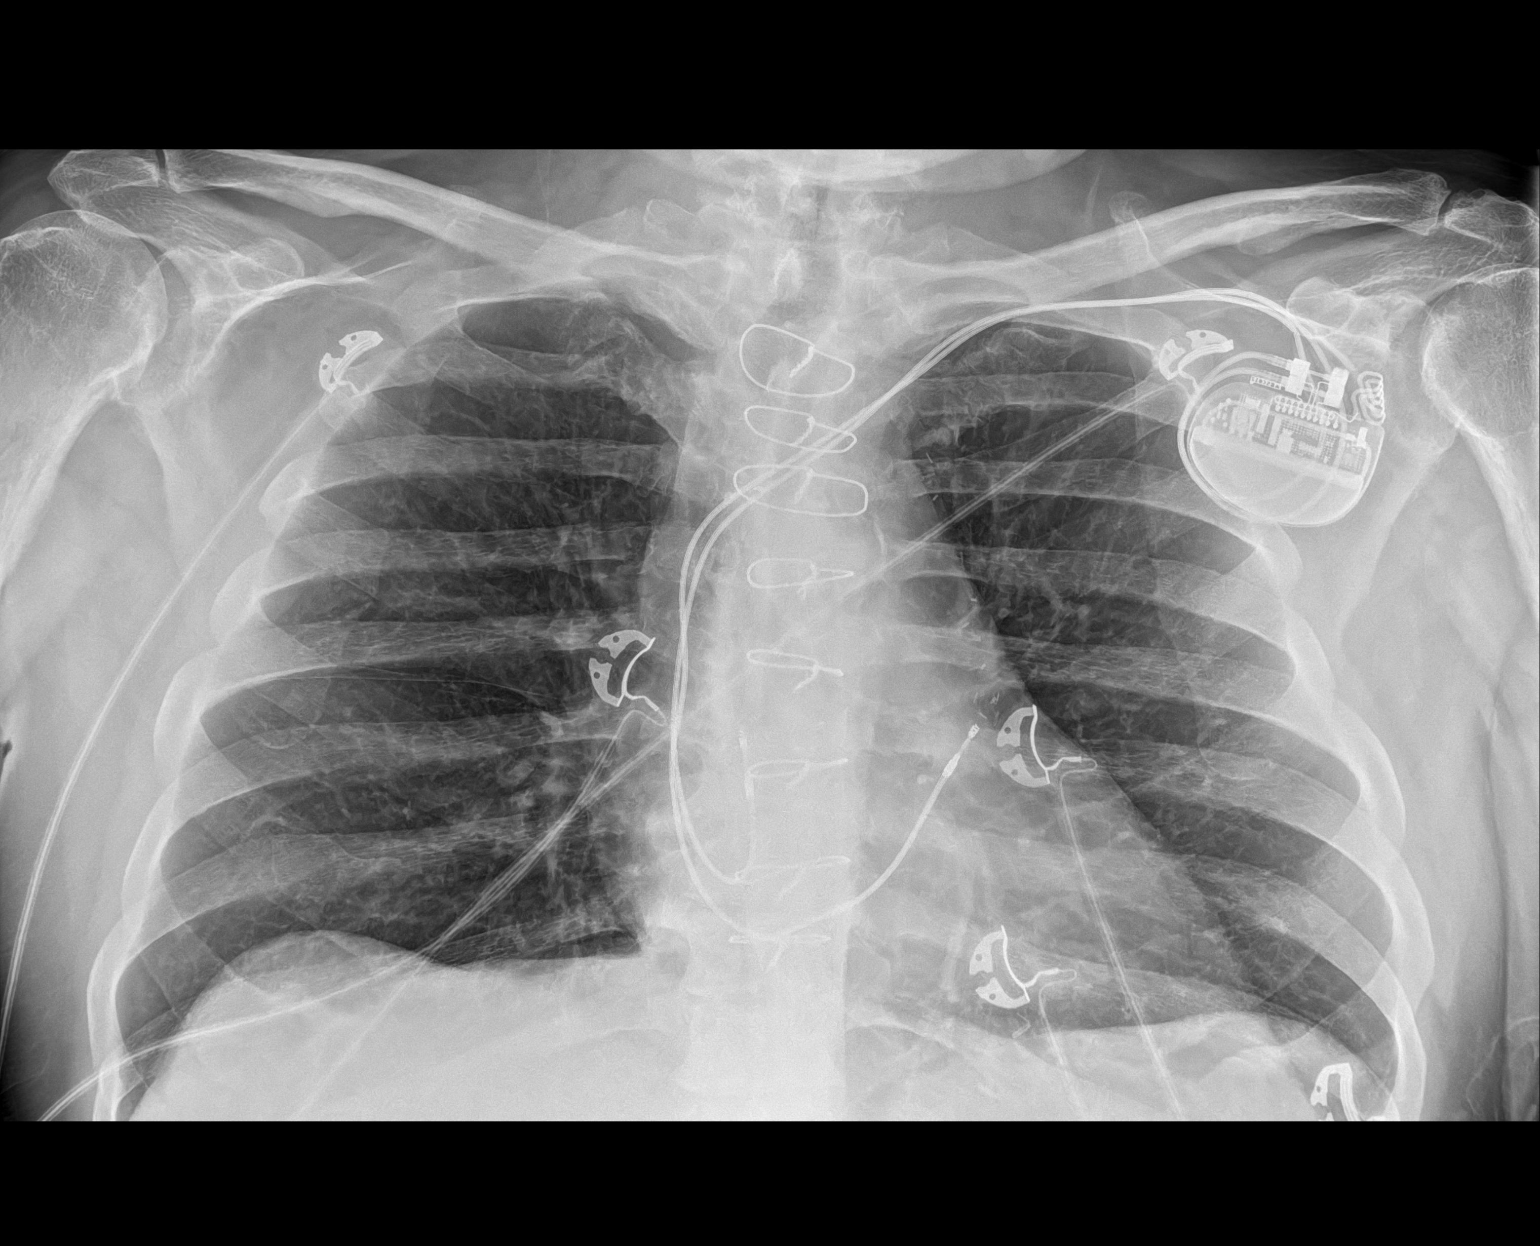

[1 of 1 positions shown; findings below may reference images not displayed]

FINDINGS: Left chest wall 2 lead pacemaker with ventricular lead appearing
slightly more superior than expected.

The heart and mediastinal contours are within normal limits.

No focal consolidation. No pulmonary edema. No pleural effusion. No
pneumothorax.

No acute osseous abnormality.
IMPRESSION: Left chest wall 2 lead pacemaker with ventricular lead appearing
slightly more superior than expected. Recommend correlation with
prior chest x-ray or cross-sectional imaging.

## 2024-03-16 IMAGING — US US ABDOMEN LIMITED
1 series · 14 of 21 positions shown · non-contrast
Comparison: None.

CLINICAL DATA: Abdominal pain.  Loss of appetite

EXAM:
ULTRASOUND ABDOMEN LIMITED RIGHT UPPER QUADRANT

[Series 1: us abdomen limited ruq (liver/gb) · 14 of 21 slices shown]
[im 1/21]
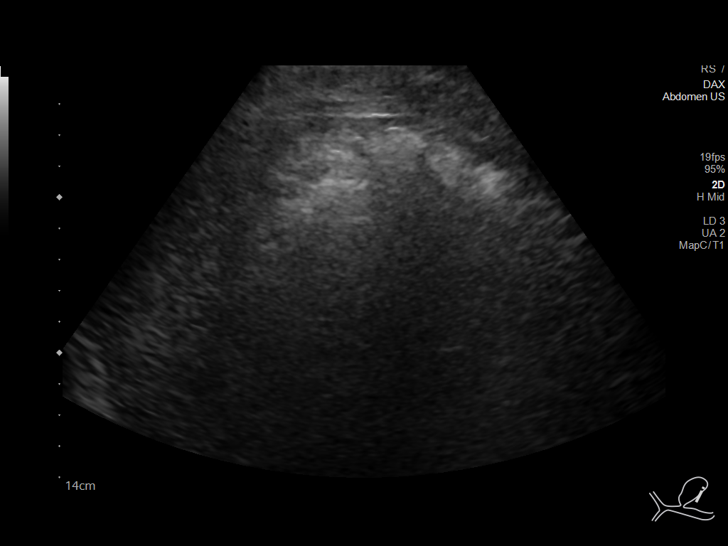
[im 3/21]
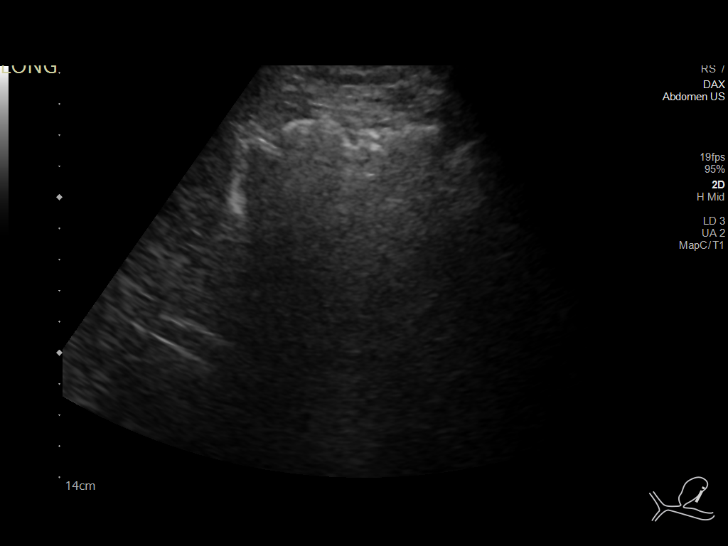
[im 4/21]
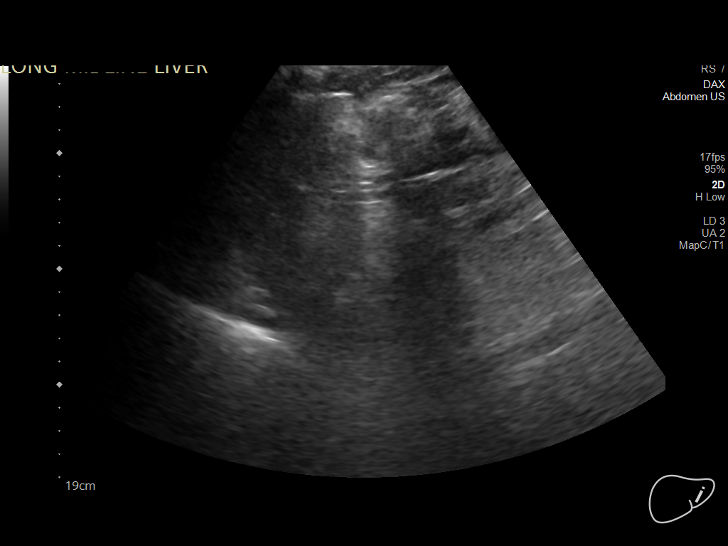
[im 6/21]
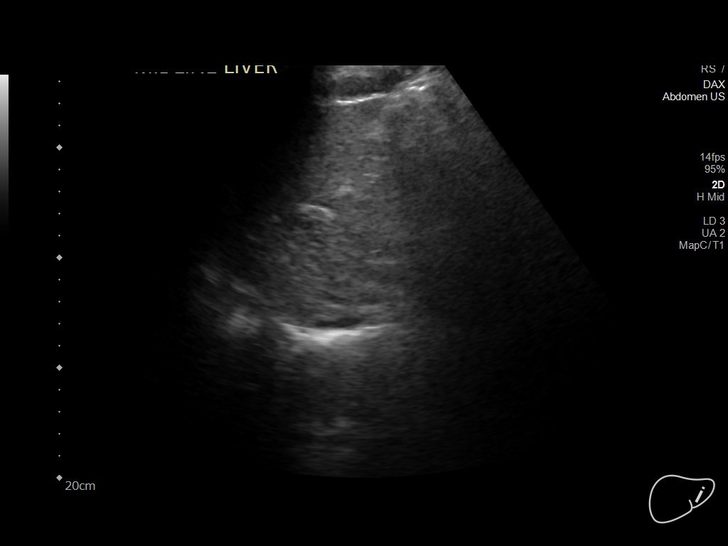
[im 7/21]
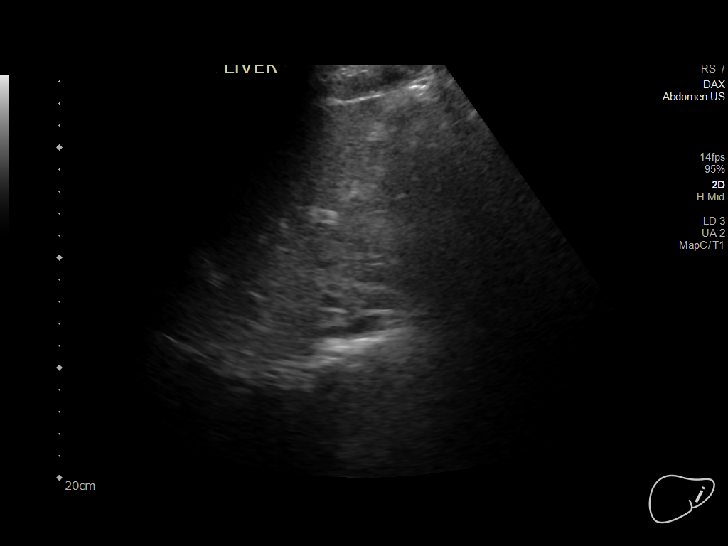
[im 9/21]
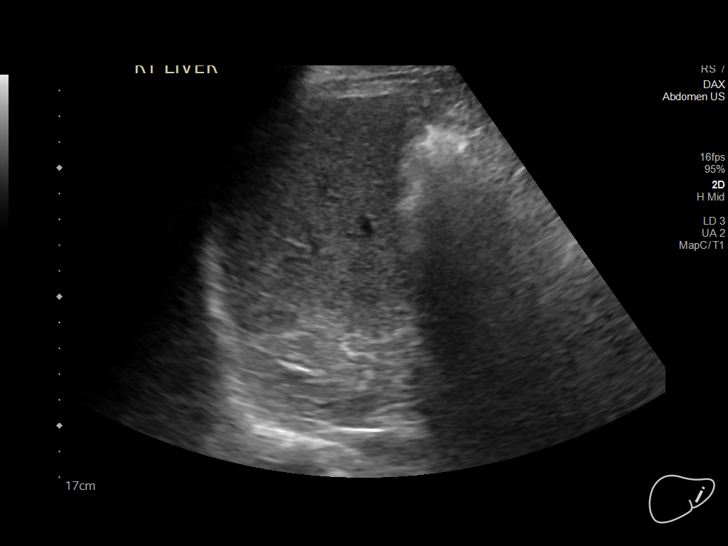
[im 10/21]
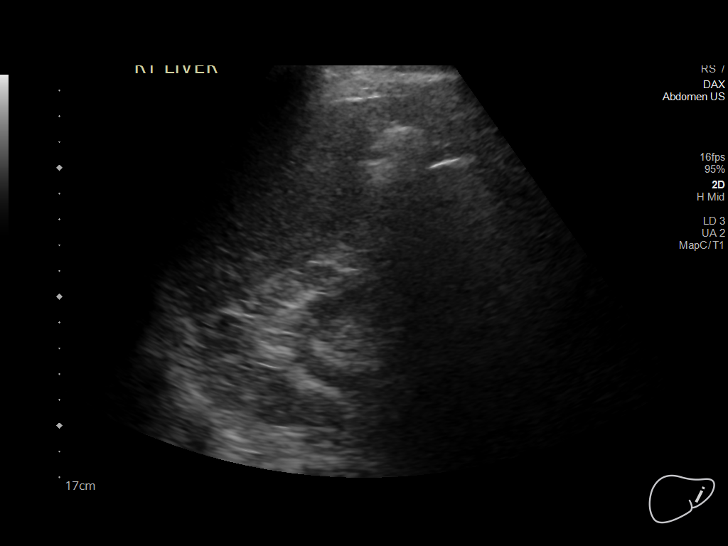
[im 12/21]
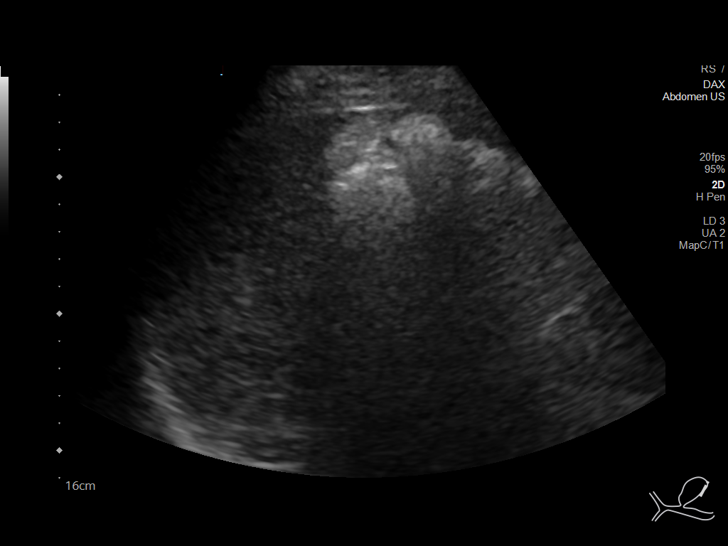
[im 13/21]
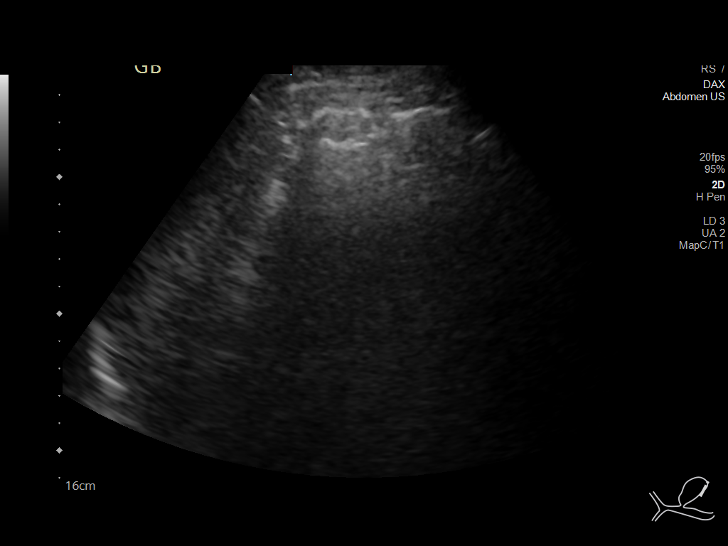
[im 15/21]
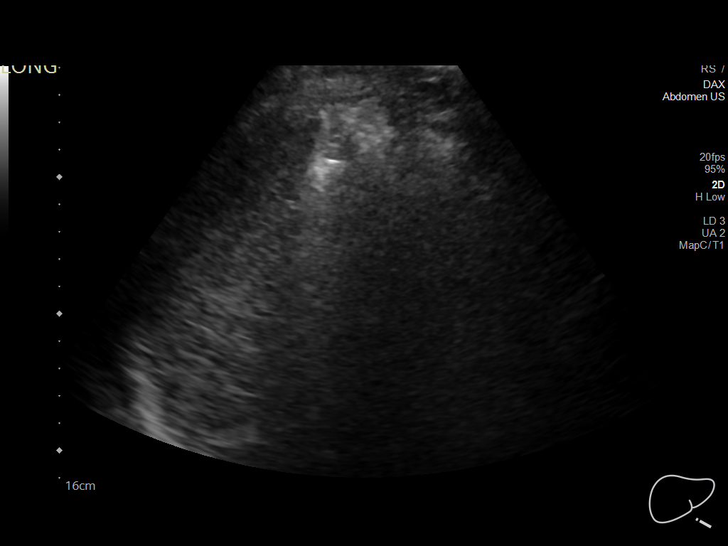
[im 16/21]
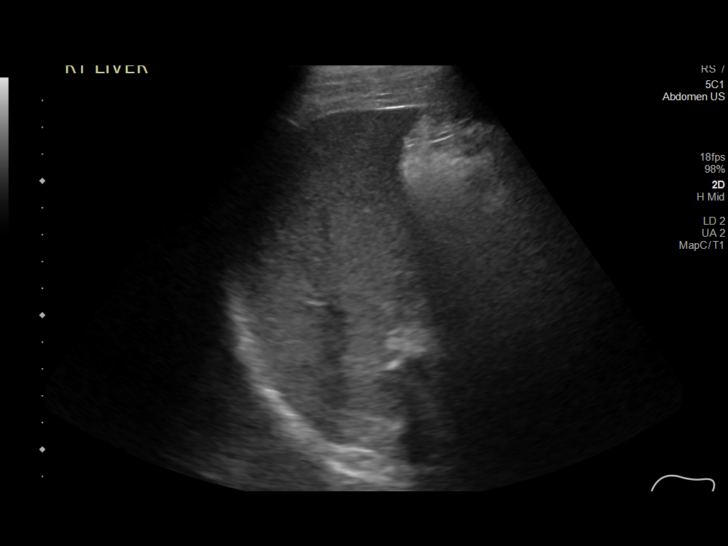
[im 18/21]
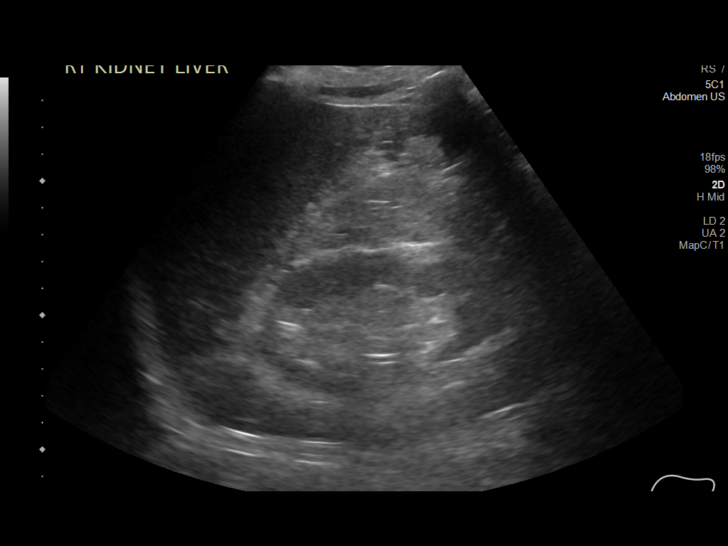
[im 19/21]
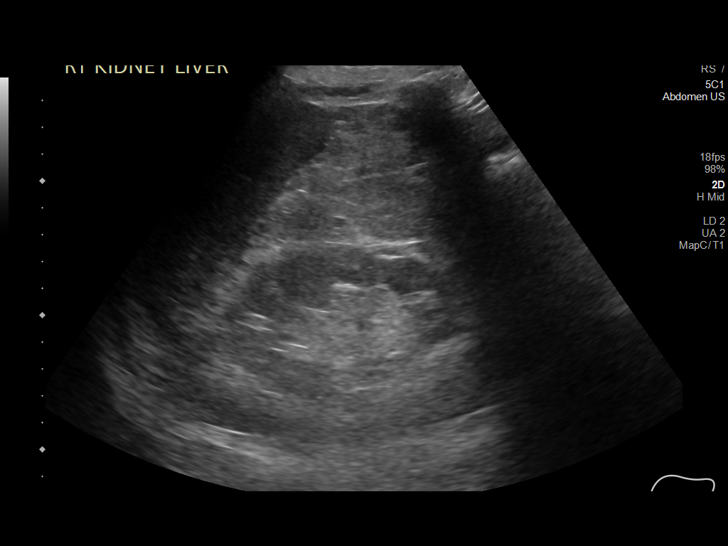
[im 21/21]
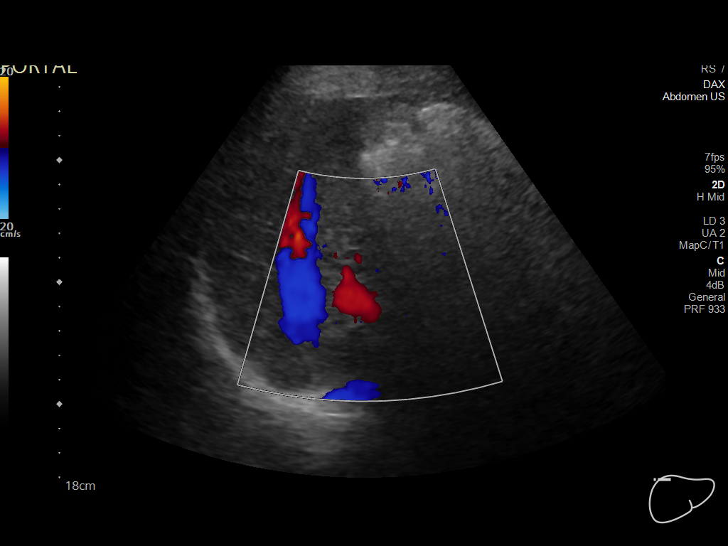

[14 of 21 positions shown; findings below may reference images not displayed]

FINDINGS: Gallbladder:

Not visualized. Correlate with any history of cholecystectomy.
Possible contracted gallbladder if no cholecystectomy.

Common bile duct:

Diameter: Not visualized.

Liver:

Diffusely echogenic liver without focal liver lesion. Portal vein is
patent on color Doppler imaging with normal direction of blood flow
towards the liver.

Other: Anatomic detail limited by bowel gas and patient size.
IMPRESSION: Limited ultrasound

Gallbladder not visualized. Correlate with cholecystectomy history.
Common bile duct nonvisualized.

Echogenic liver.

## 2024-03-16 IMAGING — CT CT ABD-PELV W/O CM
3 of 4 series · 11 of 46 positions shown, 16 images · non-contrast
Comparison: Abdominal sonogram done earlier today

CLINICAL DATA: Nausea, vomiting, abnormal liver function tests



[Series 3: ap without · axial · non-contrast · 0.84mm/px · z∈[+895,+1240]mm · 7 of 93 slices shown, 12 images]
[im 12/93  soft-tissue]
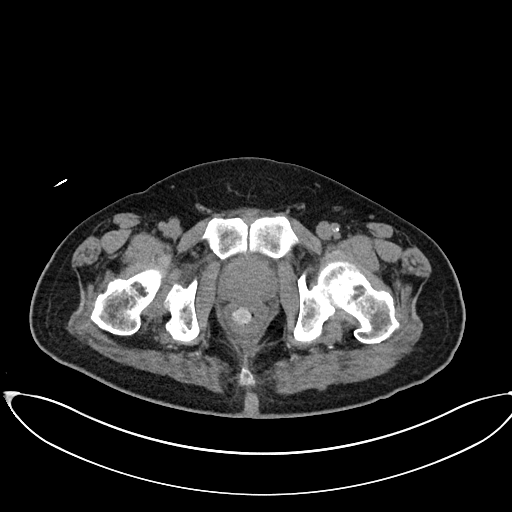
[im 12/93  bone]
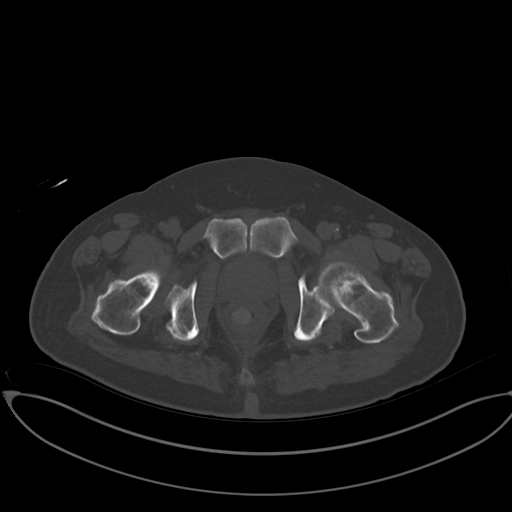
[im 24/93  soft-tissue]
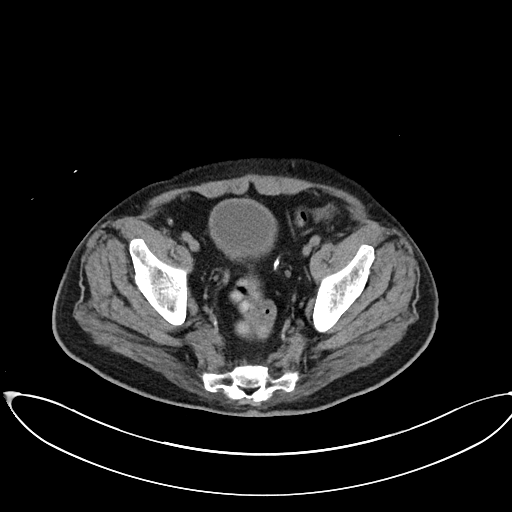
[im 35/93  soft-tissue]
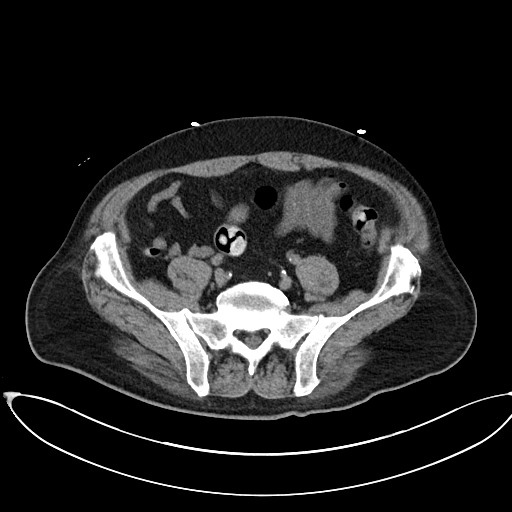
[im 47/93  soft-tissue]
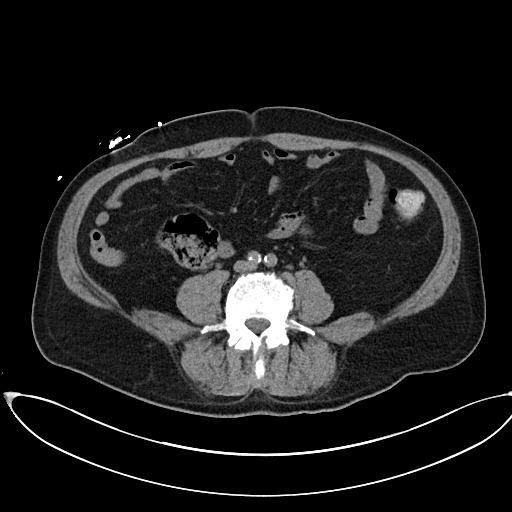
[im 47/93  lung]
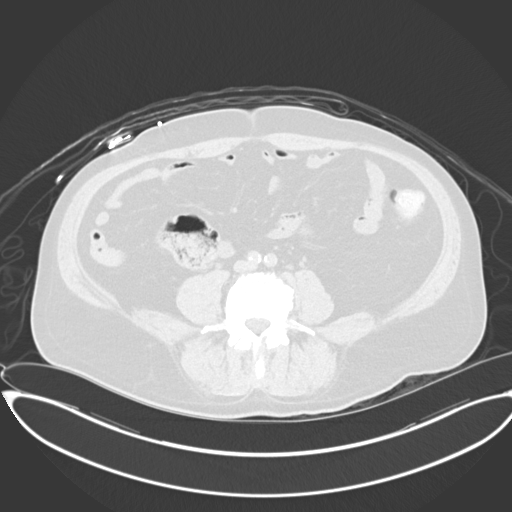
[im 58/93  soft-tissue]
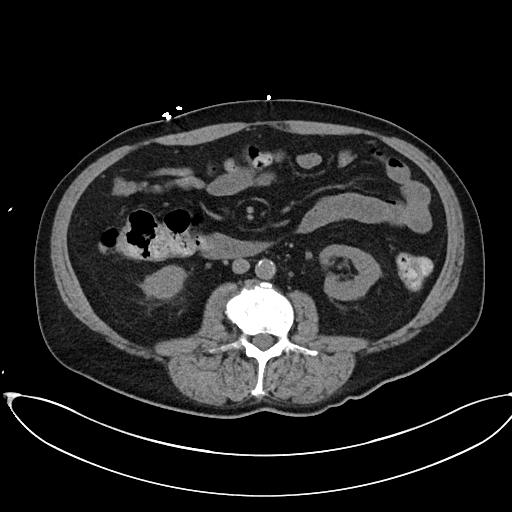
[im 58/93  lung]
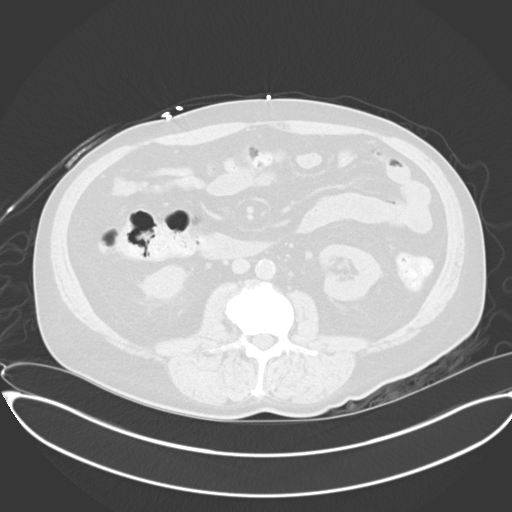
[im 70/93  soft-tissue]
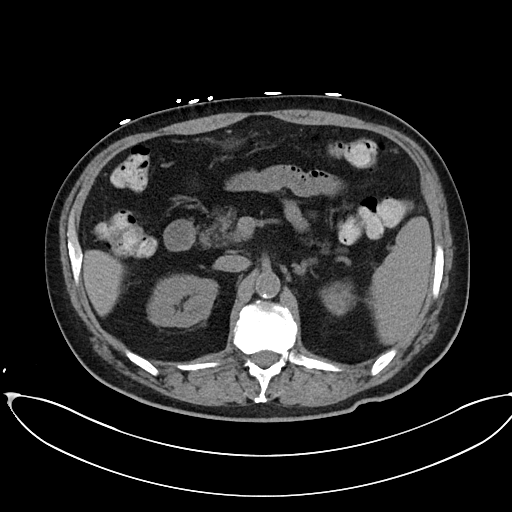
[im 70/93  lung]
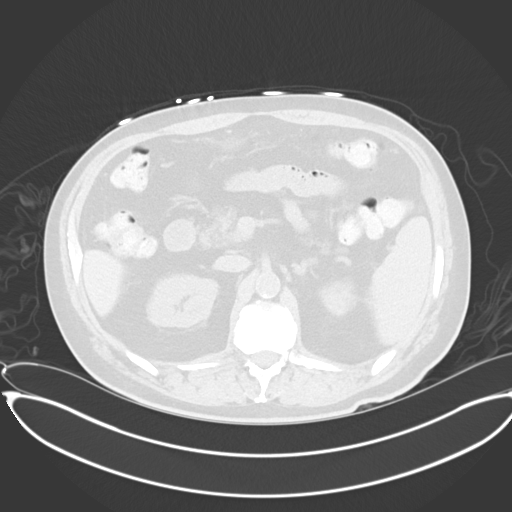
[im 81/93  soft-tissue]
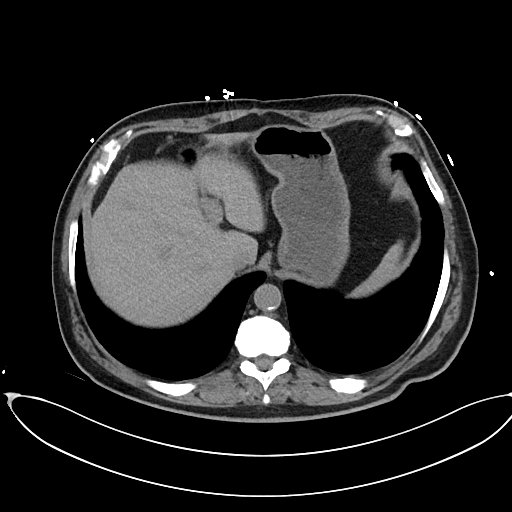
[im 81/93  lung]
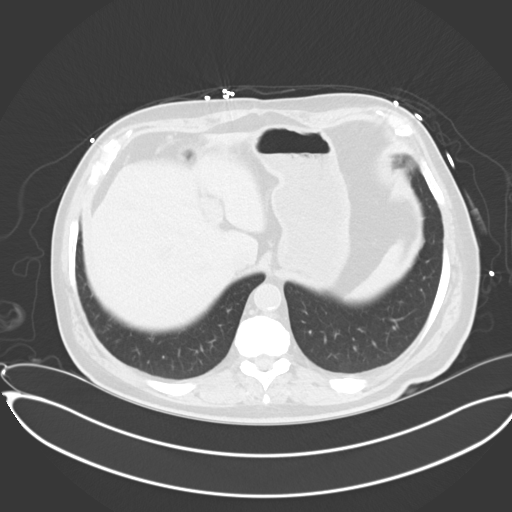

[Series 6: cor · coronal · 0.76mm/px · 3 of 96 slices shown]
[im 32/96  soft-tissue]
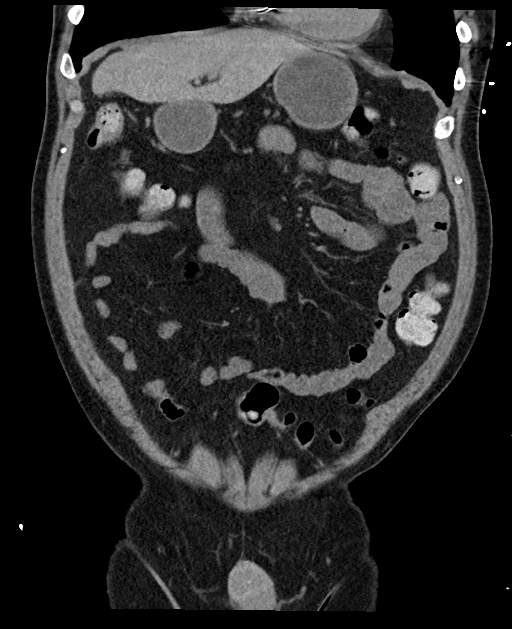
[im 43/96  soft-tissue]
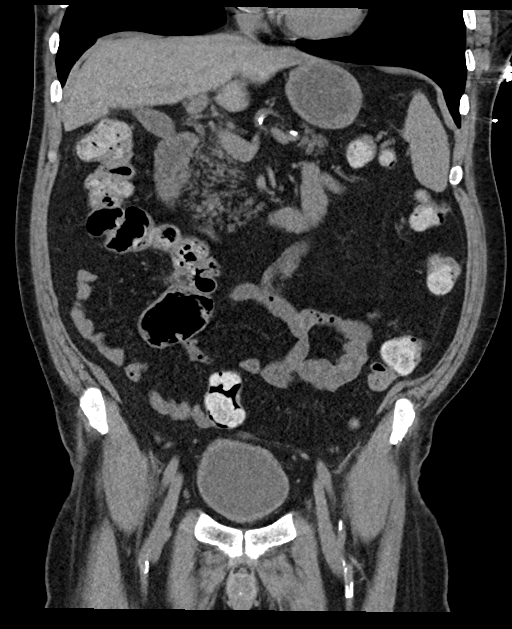
[im 53/96  soft-tissue]
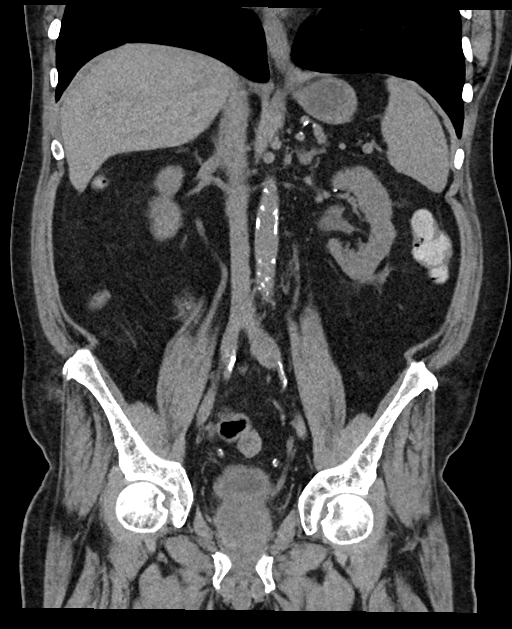

[Series 7: sag · sagittal · 0.56mm/px · 1 of 131 slices shown]
[im 44/131  soft-tissue]
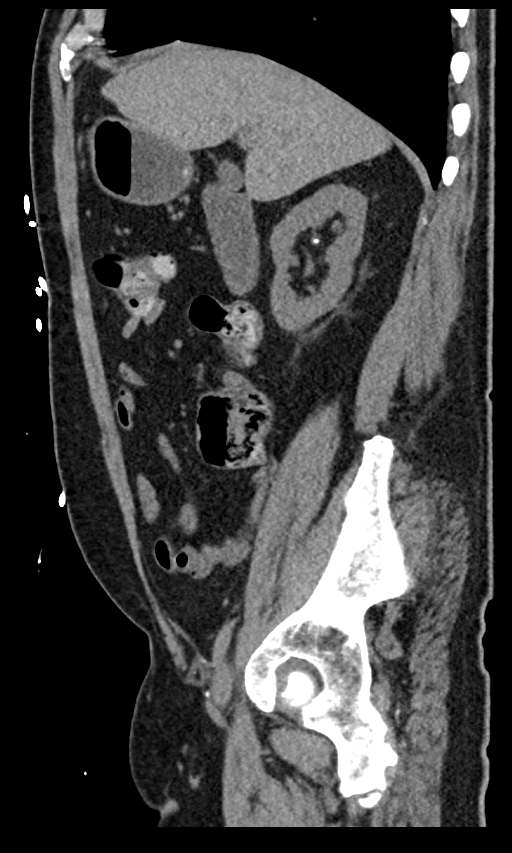

[11 of 46 positions shown; findings below may reference images not displayed]

FINDINGS: Lower chest: Motion artifacts limit evaluation. There is 8 mm
nodular density in the lateral segment of right middle lobe. Size
evaluation is limited by motion artifacts. Pacer leads are noted in
place.

Hepatobiliary: No focal abnormality is seen in the liver. There is
no dilation of bile ducts. Gallbladder is not distended.

Pancreas: No focal abnormality is seen.

Spleen: Small calcified nodule is seen in the medial aspect of
spleen.

Adrenals/Urinary Tract: Adrenals are unremarkable. There is no
hydronephrosis. There are small calcifications in the renal artery
branches. Ureters are not dilated. There is mild diffuse wall
thickening in the urinary bladder. Urinary bladder is not distended.

Stomach/Bowel: Moderate distention is seen in the stomach with fluid
in the lumen. There is no significant small bowel dilation. Appendix
is not dilated. There is no significant wall thickening in colon.
There is no pericolic stranding.

Vascular/Lymphatic: There are scattered arterial calcifications.

Reproductive: Prostate is enlarged projecting into the base of the
bladder. There are scattered coarse calcifications in the prostate.

Other: There is no ascites or pneumoperitoneum.

Musculoskeletal: Unremarkable.
IMPRESSION: There is no evidence of intestinal obstruction or pneumoperitoneum.
Appendix is not dilated. There is no hydronephrosis.

There is subcentimeter nodule in the right middle lobe which is not
fully evaluated due to motion artifacts. Follow-up CT chest may be
considered.

Enlarged prostate. Mild diffuse wall thickening in the urinary
bladder may be due to incomplete distention or suggest chronic
outlet obstruction or cystitis.

## 2024-03-19 NOTE — Progress Notes (Signed)
 Remote pacemaker transmission.

## 2024-03-20 ENCOUNTER — Other Ambulatory Visit (HOSPITAL_COMMUNITY): Payer: Self-pay

## 2024-05-09 ENCOUNTER — Ambulatory Visit (INDEPENDENT_AMBULATORY_CARE_PROVIDER_SITE_OTHER)

## 2024-05-09 DIAGNOSIS — I495 Sick sinus syndrome: Secondary | ICD-10-CM

## 2024-05-09 LAB — CUP PACEART REMOTE DEVICE CHECK
Battery Remaining Longevity: 48 mo
Battery Remaining Percentage: 81 %
Brady Statistic RA Percent Paced: 12 %
Brady Statistic RV Percent Paced: 100 %
Date Time Interrogation Session: 20250724035100
Lead Channel Impedance Value: 538 Ohm
Lead Channel Impedance Value: 751 Ohm
Lead Channel Pacing Threshold Amplitude: 0.7 V
Lead Channel Pacing Threshold Amplitude: 0.8 V
Lead Channel Pacing Threshold Pulse Width: 0.4 ms
Lead Channel Pacing Threshold Pulse Width: 0.4 ms
Lead Channel Setting Pacing Amplitude: 1.2 V
Lead Channel Setting Pacing Amplitude: 2.5 V
Lead Channel Setting Pacing Pulse Width: 0.4 ms
Lead Channel Setting Sensing Sensitivity: 2.5 mV
Pulse Gen Serial Number: 501354
Zone Setting Status: 755011

## 2024-05-12 ENCOUNTER — Ambulatory Visit: Payer: Self-pay | Admitting: Cardiology

## 2024-07-18 NOTE — Progress Notes (Signed)
 Remote PPM Transmission

## 2024-08-08 ENCOUNTER — Encounter

## 2024-08-22 ENCOUNTER — Other Ambulatory Visit (INDEPENDENT_AMBULATORY_CARE_PROVIDER_SITE_OTHER): Payer: Self-pay | Admitting: Nephrology

## 2024-11-07 ENCOUNTER — Encounter

## 2025-02-06 ENCOUNTER — Encounter
